# Patient Record
Sex: Male | Born: 1940
Health system: Southern US, Community
[De-identification: ages and names within clinical notes are randomized; demographics above are authoritative.]

## PROBLEM LIST (undated history)

## (undated) DIAGNOSIS — J449 Chronic obstructive pulmonary disease, unspecified: Secondary | ICD-10-CM

## (undated) DIAGNOSIS — Z8679 Personal history of other diseases of the circulatory system: Secondary | ICD-10-CM

## (undated) DIAGNOSIS — F32A Depression, unspecified: Secondary | ICD-10-CM

## (undated) DIAGNOSIS — I639 Cerebral infarction, unspecified: Secondary | ICD-10-CM

## (undated) DIAGNOSIS — G4730 Sleep apnea, unspecified: Secondary | ICD-10-CM

## (undated) DIAGNOSIS — Z89612 Acquired absence of left leg above knee: Secondary | ICD-10-CM

## (undated) DIAGNOSIS — Z5189 Encounter for other specified aftercare: Secondary | ICD-10-CM

## (undated) DIAGNOSIS — I739 Peripheral vascular disease, unspecified: Secondary | ICD-10-CM

## (undated) DIAGNOSIS — G510 Bell's palsy: Secondary | ICD-10-CM

## (undated) DIAGNOSIS — D7582 Heparin induced thrombocytopenia (HIT): Secondary | ICD-10-CM

## (undated) DIAGNOSIS — I4891 Unspecified atrial fibrillation: Secondary | ICD-10-CM

## (undated) DIAGNOSIS — Z01810 Encounter for preprocedural cardiovascular examination: Secondary | ICD-10-CM

## (undated) DIAGNOSIS — Z7409 Other reduced mobility: Secondary | ICD-10-CM

## (undated) DIAGNOSIS — E785 Hyperlipidemia, unspecified: Secondary | ICD-10-CM

## (undated) DIAGNOSIS — M1990 Unspecified osteoarthritis, unspecified site: Secondary | ICD-10-CM

## (undated) DIAGNOSIS — I5021 Acute systolic (congestive) heart failure: Secondary | ICD-10-CM

## (undated) DIAGNOSIS — I509 Heart failure, unspecified: Secondary | ICD-10-CM

## (undated) DIAGNOSIS — E7800 Pure hypercholesterolemia, unspecified: Secondary | ICD-10-CM

## (undated) DIAGNOSIS — I2510 Atherosclerotic heart disease of native coronary artery without angina pectoris: Secondary | ICD-10-CM

## (undated) DIAGNOSIS — I10 Essential (primary) hypertension: Secondary | ICD-10-CM

## (undated) DIAGNOSIS — I429 Cardiomyopathy, unspecified: Secondary | ICD-10-CM

## (undated) DIAGNOSIS — I248 Other forms of acute ischemic heart disease: Secondary | ICD-10-CM

## (undated) DIAGNOSIS — F419 Anxiety disorder, unspecified: Secondary | ICD-10-CM

## (undated) DIAGNOSIS — F329 Major depressive disorder, single episode, unspecified: Secondary | ICD-10-CM

## (undated) DIAGNOSIS — M47816 Spondylosis without myelopathy or radiculopathy, lumbar region: Secondary | ICD-10-CM

## (undated) DIAGNOSIS — R569 Unspecified convulsions: Secondary | ICD-10-CM

## (undated) DIAGNOSIS — I255 Ischemic cardiomyopathy: Secondary | ICD-10-CM

## (undated) DIAGNOSIS — G629 Polyneuropathy, unspecified: Secondary | ICD-10-CM

## (undated) DIAGNOSIS — M79609 Pain in unspecified limb: Secondary | ICD-10-CM

## (undated) DIAGNOSIS — I6529 Occlusion and stenosis of unspecified carotid artery: Secondary | ICD-10-CM

## (undated) DIAGNOSIS — D649 Anemia, unspecified: Secondary | ICD-10-CM

## (undated) DIAGNOSIS — I219 Acute myocardial infarction, unspecified: Secondary | ICD-10-CM

## (undated) DIAGNOSIS — I779 Disorder of arteries and arterioles, unspecified: Secondary | ICD-10-CM

## (undated) HISTORY — DX: Atherosclerotic heart disease of native coronary artery without angina pectoris: I25.10

## (undated) HISTORY — DX: Occlusion and stenosis of unspecified carotid artery: I65.29

## (undated) HISTORY — DX: Peripheral vascular disease, unspecified: I73.9

## (undated) HISTORY — DX: Anemia, unspecified: D64.9

## (undated) HISTORY — DX: Unspecified atrial fibrillation: I48.91

## (undated) HISTORY — DX: Other forms of acute ischemic heart disease: I24.8

## (undated) HISTORY — DX: Personal history of other diseases of the circulatory system: Z86.79

## (undated) HISTORY — DX: Essential (primary) hypertension: I10

## (undated) HISTORY — DX: Encounter for preprocedural cardiovascular examination: Z01.810

## (undated) HISTORY — DX: Hyperlipidemia, unspecified: E78.5

## (undated) HISTORY — DX: Heart failure, unspecified: I50.9

## (undated) HISTORY — DX: Acquired absence of left leg above knee: Z89.612

## (undated) HISTORY — PX: LEG AMPUTATION ABOVE KNEE: SHX117

## (undated) HISTORY — PX: FEMORAL BYPASS: SHX50

## (undated) HISTORY — DX: Sleep apnea, unspecified: G47.30

## (undated) HISTORY — DX: Acute systolic (congestive) heart failure: I50.21

## (undated) HISTORY — PX: THROMBOENDARTERECTOMY: SHX46

## (undated) HISTORY — DX: Acute myocardial infarction, unspecified: I21.9

## (undated) HISTORY — PX: CHOLECYSTECTOMY: SHX55

## (undated) HISTORY — DX: Encounter for other specified aftercare: Z51.89

## (undated) HISTORY — DX: Cerebral infarction, unspecified: I63.9

## (undated) HISTORY — DX: Anxiety disorder, unspecified: F41.9

## (undated) HISTORY — DX: Major depressive disorder, single episode, unspecified: F32.9

## (undated) HISTORY — DX: Heparin induced thrombocytopenia (HIT): D75.82

## (undated) HISTORY — DX: Depression, unspecified: F32.A

## (undated) HISTORY — PX: APPENDECTOMY: SHX54

## (undated) HISTORY — DX: Cardiomyopathy, unspecified: I42.9

## (undated) HISTORY — DX: Other reduced mobility: Z74.09

## (undated) HISTORY — DX: Spondylosis without myelopathy or radiculopathy, lumbar region: M47.816

## (undated) HISTORY — DX: Disorder of arteries and arterioles, unspecified: I77.9

## (undated) HISTORY — DX: Chronic obstructive pulmonary disease, unspecified: J44.9

## (undated) HISTORY — PX: FRACTURE SURGERY: SHX138

## (undated) HISTORY — DX: Unspecified osteoarthritis, unspecified site: M19.90

## (undated) HISTORY — DX: Polyneuropathy, unspecified: G62.9

## (undated) HISTORY — DX: Unspecified convulsions: R56.9

## (undated) HISTORY — DX: Pain in unspecified limb: M79.609

## (undated) HISTORY — DX: Ischemic cardiomyopathy: I25.5

## (undated) HISTORY — DX: Bell's palsy: G51.0

## (undated) HISTORY — DX: Pure hypercholesterolemia, unspecified: E78.00

---
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
## 1999-03-28 HISTORY — PX: SPINE SURGERY: SHX786

## 2000-01-25 HISTORY — PX: ANGIOPLASTY: SHX39

## 2000-02-07 ENCOUNTER — Inpatient Hospital Stay (HOSPITAL_COMMUNITY): Admission: EM | Admit: 2000-02-07 | Discharge: 2000-02-09 | Payer: Self-pay | Admitting: Cardiology

## 2000-07-25 HISTORY — PX: OTHER SURGICAL HISTORY: SHX169

## 2000-08-17 ENCOUNTER — Inpatient Hospital Stay (HOSPITAL_COMMUNITY): Admission: AD | Admit: 2000-08-17 | Discharge: 2000-08-20 | Payer: Self-pay | Admitting: Internal Medicine

## 2000-08-17 ENCOUNTER — Encounter: Payer: Self-pay | Admitting: Internal Medicine

## 2000-09-24 HISTORY — PX: CAROTID ENDARTERECTOMY: SUR193

## 2000-10-02 ENCOUNTER — Encounter: Payer: Self-pay | Admitting: *Deleted

## 2000-10-06 ENCOUNTER — Encounter (INDEPENDENT_AMBULATORY_CARE_PROVIDER_SITE_OTHER): Payer: Self-pay | Admitting: *Deleted

## 2000-10-06 ENCOUNTER — Inpatient Hospital Stay (HOSPITAL_COMMUNITY): Admission: RE | Admit: 2000-10-06 | Discharge: 2000-10-08 | Payer: Self-pay | Admitting: *Deleted

## 2000-10-07 ENCOUNTER — Encounter: Payer: Self-pay | Admitting: Pediatrics

## 2005-02-24 HISTORY — PX: ROTATOR CUFF REPAIR: SHX139

## 2007-02-25 HISTORY — PX: COLON SURGERY: SHX602

## 2008-12-22 ENCOUNTER — Encounter: Admission: RE | Admit: 2008-12-22 | Discharge: 2008-12-22 | Payer: Self-pay | Admitting: Orthopedic Surgery

## 2009-07-10 ENCOUNTER — Inpatient Hospital Stay (HOSPITAL_COMMUNITY)
Admission: RE | Admit: 2009-07-10 | Discharge: 2009-07-12 | Payer: Self-pay | Source: Home / Self Care | Admitting: Neurosurgery

## 2009-10-31 ENCOUNTER — Encounter: Admission: RE | Admit: 2009-10-31 | Discharge: 2009-10-31 | Payer: Self-pay | Admitting: Neurosurgery

## 2009-11-23 ENCOUNTER — Ambulatory Visit: Payer: Self-pay | Admitting: Critical Care Medicine

## 2009-11-23 ENCOUNTER — Inpatient Hospital Stay (HOSPITAL_COMMUNITY)
Admission: RE | Admit: 2009-11-23 | Discharge: 2009-11-28 | Payer: Self-pay | Source: Home / Self Care | Admitting: Neurosurgery

## 2010-05-07 ENCOUNTER — Other Ambulatory Visit: Payer: Self-pay | Admitting: Neurosurgery

## 2010-05-07 DIAGNOSIS — M47816 Spondylosis without myelopathy or radiculopathy, lumbar region: Secondary | ICD-10-CM

## 2010-05-08 LAB — BASIC METABOLIC PANEL
BUN: 14 mg/dL (ref 6–23)
CO2: 28 mEq/L (ref 19–32)
Calcium: 8.3 mg/dL — ABNORMAL LOW (ref 8.4–10.5)
Chloride: 103 mEq/L (ref 96–112)
Creatinine, Ser: 1.09 mg/dL (ref 0.4–1.5)
GFR calc Af Amer: >60 mL/min (ref >60)
Potassium: 4.6 mEq/L (ref 3.5–5.1)

## 2010-05-08 LAB — CBC
MCH: 31.8 pg (ref 26.0–34.0)
MCHC: 33.2 g/dL (ref 30.0–36.0)
MCV: 95.5 fL (ref 78.0–100.0)
Platelets: 106 10*3/uL — ABNORMAL LOW (ref 150–400)
RDW: 13.2 % (ref 11.5–15.5)
WBC: 9.5 10*3/uL (ref 4.0–10.5)

## 2010-05-08 LAB — BLOOD GAS, ARTERIAL
Bicarbonate: 22.3 mEq/L (ref 20.0–24.0)
O2 Saturation: 96.6 %
pH, Arterial: 7.498 — ABNORMAL HIGH (ref 7.350–7.450)

## 2010-05-09 ENCOUNTER — Ambulatory Visit
Admission: RE | Admit: 2010-05-09 | Discharge: 2010-05-09 | Disposition: A | Discharge status: Home / Self Care | Payer: Medicare Other | Source: Ambulatory Visit | Attending: Neurosurgery | Admitting: Neurosurgery

## 2010-05-09 DIAGNOSIS — M47816 Spondylosis without myelopathy or radiculopathy, lumbar region: Secondary | ICD-10-CM

## 2010-05-09 LAB — COMPREHENSIVE METABOLIC PANEL
AST: 36 U/L (ref 0–37)
Albumin: 4 g/dL (ref 3.5–5.2)
BUN: 14 mg/dL (ref 6–23)
CO2: 30 mEq/L (ref 19–32)
Calcium: 9.3 mg/dL (ref 8.4–10.5)

## 2010-05-09 LAB — POCT I-STAT 3, ART BLOOD GAS (G3+)
Acid-base deficit: 4 mmol/L — ABNORMAL HIGH (ref 0.0–2.0)
Patient temperature: 98.5
TCO2: 28 mmol/L (ref 0–100)
pCO2 arterial: 67.9 mmHg (ref 35.0–45.0)
pH, Arterial: 7.184 — CL (ref 7.350–7.450)
pO2, Arterial: 72 mmHg — ABNORMAL LOW (ref 80.0–100.0)

## 2010-05-09 LAB — CBC
Hemoglobin: 14 g/dL (ref 13.0–17.0)
MCV: 93.6 fL (ref 78.0–100.0)
WBC: 4.9 10*3/uL (ref 4.0–10.5)

## 2010-05-09 LAB — URINALYSIS, ROUTINE W REFLEX MICROSCOPIC
Glucose, UA: NEGATIVE mg/dL
Nitrite: NEGATIVE
Protein, ur: NEGATIVE mg/dL
Specific Gravity, Urine: 1.019 (ref 1.005–1.030)
Urobilinogen, UA: 0.2 mg/dL (ref 0.0–1.0)
pH: 6.5 (ref 5.0–8.0)

## 2010-05-09 LAB — DIFFERENTIAL
Eosinophils Absolute: 0.3 10*3/uL (ref 0.0–0.7)
Eosinophils Relative: 6 % — ABNORMAL HIGH (ref 0–5)
Monocytes Relative: 9 % (ref 3–12)

## 2010-05-09 LAB — PROTIME-INR: Prothrombin Time: 13.4 seconds (ref 11.6–15.2)

## 2010-05-09 LAB — SURGICAL PCR SCREEN: Staphylococcus aureus: NEGATIVE

## 2010-05-09 LAB — TYPE AND SCREEN

## 2010-05-13 LAB — MRSA PCR SCREENING: MRSA by PCR: NEGATIVE

## 2010-05-14 LAB — URINALYSIS, ROUTINE W REFLEX MICROSCOPIC
Bilirubin Urine: NEGATIVE
Glucose, UA: NEGATIVE mg/dL
Ketones, ur: NEGATIVE mg/dL
pH: 6 (ref 5.0–8.0)

## 2010-05-14 LAB — COMPREHENSIVE METABOLIC PANEL
ALT: 34 U/L (ref 0–53)
Albumin: 4 g/dL (ref 3.5–5.2)
CO2: 30 mEq/L (ref 19–32)
GFR calc Af Amer: >60 mL/min (ref >60)
Glucose, Bld: 70 mg/dL (ref 70–99)

## 2010-05-14 LAB — DIFFERENTIAL
Basophils Absolute: 0 10*3/uL (ref 0.0–0.1)
Basophils Relative: 0 % (ref 0–1)
Eosinophils Relative: 5 % (ref 0–5)
Monocytes Relative: 13 % — ABNORMAL HIGH (ref 3–12)

## 2010-05-14 LAB — BILIRUBIN, DIRECT: Bilirubin, Direct: 0.1 mg/dL (ref 0.0–0.3)

## 2010-05-14 LAB — CBC: RBC: 4.29 MIL/uL (ref 4.22–5.81)

## 2010-05-14 LAB — PROTIME-INR: Prothrombin Time: 12.6 seconds (ref 11.6–15.2)

## 2010-07-02 ENCOUNTER — Other Ambulatory Visit: Payer: Self-pay | Admitting: Neurosurgery

## 2010-07-02 DIAGNOSIS — M47816 Spondylosis without myelopathy or radiculopathy, lumbar region: Secondary | ICD-10-CM

## 2010-07-03 ENCOUNTER — Ambulatory Visit
Admission: RE | Admit: 2010-07-03 | Discharge: 2010-07-03 | Disposition: A | Discharge status: Home / Self Care | Payer: Medicare Other | Source: Ambulatory Visit | Attending: Neurosurgery | Admitting: Neurosurgery

## 2010-07-03 ENCOUNTER — Other Ambulatory Visit: Payer: Self-pay | Admitting: Neurosurgery

## 2010-07-03 DIAGNOSIS — M47816 Spondylosis without myelopathy or radiculopathy, lumbar region: Secondary | ICD-10-CM

## 2010-07-12 NOTE — Cardiovascular Report (Signed)
[place]. [facility]  Patient:    [name] [name], [name] [name] Visit Number: [id number] MRN: [id number]          Service Type: SUR Location: [id number] Attending Physician:  Melvenia Needles Proc. Date: 08/18/00 Admit Date:  10/06/2000 Discharge Date: 10/08/2000   CC:         Dr. Sherlyn Lick, Ruben Reason, M.D. [facility]   Cardiac Catheterization  DATE OF BIRTH:  Jan 29, 1941  PROCEDURE: 1. Left heart catheterization with selective coronary angiography. 2. Ventriculography.  DIAGNOSES: 1. Single vessel coronary artery disease with a 50 to 70% stenosis of the mid    left anterior descending. 2. Patent stent in the mid left anterior descending. 3. Elevated left ventricular end diastolic pressure. 4. Normal left ventricular systolic function.  LOCATION:  Right femoral artery.  COMPLICATIONS:  None.  DESCRIPTION OF PROCEDURE:  After informed consent was obtained, the patient was brought to the catheterization laboratory.  The right femoral artery was accessed using modified Seldinger technique.  Selective coronary angiography was performed with 6 [place] JL4 and JR4 catheters.  Selective coronary angiography was performed in two projections using manual injection of contrast. After selective coronary angiography, ventriculography was performed with an angled 6 French pigtail catheter.  Power injection of contrast was used in the single plane RAO projection.  No complications were noted after the procedure and the patient was brought back to the recovery room.  FINDINGS:  HEMODYNAMICS:  Aortic pressure 140/70, left ventricular pressure 140/18 mmHg.  SELECTIVE CORONARY ANGIOGRAPHY:  Left main coronary artery is normal.  Left anterior descending artery has a diffuse proximal 30% stenosis.  The stent in the mid LAD just beyond the septal perforator shows no evidence of in-stent restenosis and is widely patent.  The mid LAD just proximal to the  third diagonal branch has approximately 50 to 70% stenosis with somewhat of a hazy appearance.  Circumflex coronary artery gives rise to two large obtuse marginal branches with the first obtuse marginal branch having a 30% diffuse stenosis in the proximal segment.  Right coronary artery is a large caliber vessel with a 30% stenosis in the proximal vessel, 20 to 30% in the midvessel, and diffuse blocking in the distal part of the vessel.  Two posterolateral branches and the posterior descending artery terminate the right coronary artery and they are free of flow limiting disease.  Ventriculography performed in single plane RAO projection.  Ejection fraction is 62%.  There is no mitral regurgitation and no significant wall motion abnormalities.  RECOMMENDATIONS:  Angiographic images have been reviewed with Dr. Riley Kill. The patients symptoms of substernal chest pain and somewhat worsened appearance of this lesion in the mid-LAD, the decision was made to proceed with percutaneous coronary intervention to the mid-LAD.  Of note, the patient did have a stress test done last Friday in the office antidating his sudden onset of shortness of breath and substernal chest pain.  There was no evidence of ischemia on this study. Attending Physician:  Melvenia Needles DD:  08/18/00 TD:  08/18/00 Job: [id number]

## 2010-07-12 NOTE — Op Note (Signed)
[place]. [facility]  Patient:    [name] [name], [name] [name]                    MRN: [id number] Proc. Date: 10/06/00 Adm. Date:  [id number] Attending:  Melvenia Needles CC:         Arturo Morton. Riley Kill, M.D. [facility]   Operative Report  PREOPERATIVE DIAGNOSIS:  Severe right internal carotid artery stenosis.  POSTOPERATIVE DIAGNOSIS:  Severe right internal carotid artery stenosis.  PROCEDURE:  Right carotid endarterectomy, Dacron patch angioplasty.  SURGEON:  Denman George, M.D.  ASSISTANT:  Dominica Severin, P.A.  ANESTHESIA:  General endotracheal.  ANESTHESIOLOGIST:  Bedelia Person, M.D.  CLINICAL NOTE:  This is a 70 year old male with a history of advanced coronary artery disease.  He was recently found to have bilateral carotid bruits. Doppler evaluation revealed severe right internal carotid artery stenosis, moderate to severe left internal carotid artery stenosis.  He denied symptoms.  Patient seen and evaluated in the CVTS office.  Recommendation made for right carotid endarterectomy for reduction of stroke risk.  The patient consented to surgery.  Risks and benefits of the operative procedure explained to the patient in detail, including the potential risks of MI, CVA, and death, approximately 1-2%.  DESCRIPTION OF PROCEDURE:  Patient placed in the supine position.  General endotracheal anesthesia induced.  Foley catheter, arterial line in place. Right neck prepped and draped in a sterile fashion.  Curvilinear skin incision made along the anterior border of the right sternomastoid muscle.  Subcutaneous tissue and platysma divided with electrocautery.  Deep dissection carried down through the subcutaneous tissue to the sternomastoid muscle.  Sternomastoid muscle reflected posteriorly.  The facial vein ligated with 2-0 silk and divided.  The common carotid artery mobilized at the omohyoid and encircled with a vessel loop.  The carotid bifurcation exposed, the  origin of the superior thyroid and external carotid encircled with vessel loops.  The internal carotid artery followed distally up to the posterior belly of the digastric muscle.  The hypoglossal nerve and vagus nerve were both clearly identified and preserved.  The distal internal carotid artery encircled with a vessel loop.  The patient administered a total of 7000 units of heparin intravenously. Adequate circulation time permitted.  The carotid vessels controlled with clamps.  A longitudinal arteriotomy made in the distal common carotid artery. The arteriotomy extended across the carotid bulb and up into the internal carotid artery.  A fibrous plaque was present.  At the carotid bifurcation was approximately a 90% right internal carotid artery stenosis.  A shunt was inserted.  A endarterectomy elevator used to remove the plaque.  The plaque divided proximally in the common carotid artery with Potts scissors.  The external carotid and the superior thyroid endarterectomized using an eversion technique.  The plaque feathered well out of the internal carotid artery. Fragments of plaque removed with plaque forceps.  The site irrigated with heparin and saline solution.  A preclotted knitted Dacron patch was then placed over the endarterectomy site with running 6-0 Prolene suture.  At completion of this, the shunt was removed.  All vessels well flushed.  Clamps were removed, directing the initial antegrade flow up the external carotid artery.  Following this, the internal carotid was released.  Excellent pulse and Doppler signal in the distal internal carotid artery.  The patient administered 50 mg protamine intravenously.  Adequate hemostasis obtained, sponge and instrument counts were correct.  Sternomastoid fascia closed with running 2-0 Vicryl  suture.  Platysma closed with running 3-0 Vicryl suture.  Skin closed with 4-0 Monocryl.  Half-inch Steri-Strips applied.  The patient  transferred to the recovery room.  Neurologically intact. DD:  10/06/00 TD:  10/06/00 Job: [id number]

## 2010-07-12 NOTE — Cardiovascular Report (Signed)
[place]. [facility]  Patient:    [name] [name], [name] [name]                    MRN: [id number] Proc. Date: 02/07/00 Adm. Date:  [id number] Attending:  Nelta Numbers CC:         Dr. Lawana Pai  Cath Lab   Cardiac Catheterization  PROCEDURE: 1. Left heart catheterization with coronary angiography, left    ventriculography, and abdominal aortography. 2. PTCA with stent placement in the mid-LAD.  INDICATIONS:  Mr. Gail is a 70 year old male who was admitted with a non-Q wave myocardial infarction.  DESCRIPTION OF PROCEDURE:  A 6 French sheath was placed in the right femoral artery.  Standard Judkins 6 French catheters were utilized.  Contrast was Ominpaque.  There were no complications.  RESULTS:  HEMODYNAMICS:  Left ventricular pressure 122/15, aortic pressure 138/62. There was no aortic valve gradient.  LEFT VENTRICULOGRAM:  There is moderate hypokinesis of the anterior wall and mild akinesis of the apical wall.  Ejection fraction calculated at 55%. No mitral regurgitation.  ABDOMINAL AORTOGRAM:  Revealed patent right renal artery.  There are two left renal arteries, the most superior of which is patent, the inferior of which has a 70% stenosis.  There is mild diffuse atherosclerotic disease of the infrarenal abdominal aorta.  The left common iliac artery has a proximal 60% stenosis and the right iliac artery has a 30% stenosis.  CORONARY ARTERIOGRAPHY:  (Right dominant).  Left main is normal.  Left anterior descending artery has a tubular 30% stenosis in the proximal vessel followed by a 95% stenosis in the midvessel.  Further down in the midvessel is a 50% stenosis.  The distal LAD has a 20% stenosis.  The LAD gives rise to two small diagonal branches.  The left circumflex gives rise to a normal size first marginal, normal second marginal, and a small third marginal.  There is a 30% stenosis in the proximal portion of the first marginal  branch.  Right coronary artery is a dominant vessel.  It has a 30% stenosis proximally followed by 25% stenosis.  The midvessel has a 30% stenosis.  The distal right coronary artery gives rise to a normal size posterior descending artery and two normal size posterolateral branches.  IMPRESSION: 1. Mildly decreased left ventricular systolic function as described. 2. Moderate peripheral vascular disease. 3. One vessel coronary artery disease characterized by a critical stenosis    of the mid-LAD.  PLAN:  Percutaneous intervention of the LAD, see below.  PTCA PROCEDURE:  Following completion of diagnostic catheterization, we proceeded with coronary intervention.  A preexisting 6 French sheath in the right femoral artery exchanged over wire for 7 French sheath.  The patient was enrolled in the VICC study comparing different contrast agents.  He was maintained on Aggrastat which had been started on admission and heparin was administered to achieve an ACT of over 200 seconds.  We used a 7 [place] guiding catheter and a BMW wire.  The lesion was initially dilated with a 3.0 x 15 mm Quantum Ranger balloon inflated to 8 atmospheres.  We then deployed a 2.75 x 15 mm Penta stent at 12 atmospheres.  Final angiographic images revealed patency of the LAD with 0% residual stenosis and [place] 3 flow.  COMPLICATIONS:  None.  RESULTS:  Successful PTCA with stent placement in the mid-LAD reducing a 95% stenosis to 0% residual with [place] 3 flow.  PLAN:  Aggrastat will be  continued for an additional 18 hours.  Plavix will be administered for four weeks. DD:  02/07/00 TD:  02/08/00 Job: [id number]

## 2010-07-12 NOTE — Discharge Summary (Signed)
[place]. [facility]  Patient:    [name] [name], [name] [name] Visit Number: [id number] MRN: [id number]          Service Type: SUR Location: [id number] Attending Physician:  Melvenia Needles Dictated by:   Dominica Severin, P.A. Admit Date:  10/06/2000 Discharge Date: 10/08/2000   CC:         CVTS office  Dr. Nelia Shi, [place]  Arturo Morton. Riley Kill, M.D. [facility]  Deanna Artis. Sharene Skeans, M.D.   Discharge Summary  DATE OF BIRTH:  [birth year]  PRIMARY ADMISSION DIAGNOSIS:  Right internal left carotid artery stenosis.  SECONDARY DIAGNOSES/PAST MEDICAL HISTORY: 1. Carotid artery disease. 2. Hypertension. 3. Hyperlipidemia. 4. Hard of hearing. 5. Coronary artery disease. 6. Previous history of tobacco abuse. 7. Status post PTCA/stent of left anterior descending x 2 in December 2001    and January 2002, respectively. 8. 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  NEW DIAGNOSES/DISCHARGE DIAGNOSES: 1. Status post right carotid endarterectomy. 2. Status post right Bells palsy.  PROCEDURES: 1. Right carotid endarterectomy with Dacron patch angioplasty, done on    October 06, 2000. 2. Modified barium swallow study done on October 07, 2000.  HOSPITAL COURSE:  This patient was referred by Dr. Riley Kill for evaluation of carotid artery disease as part of his cardiovascular workup. Carotid bruits were heard prompting Doppler evaluation.  Dopplers on August 19, 2000, demonstrated severe right internal carotid artery stenosis greater than 80% and moderate disease in the left.  The patient was evaluated by Dr. Madilyn Fireman.  He recommended to proceed with the right carotid endarterectomy, which took place on October 06, 2000.  The patient tolerated the procedure well.  His postoperative course was notable for some right seventh nerve palsy, which was found later on August 13.  The patient was seen and evaluated by neurology and the patient was found to have a Bells palsy.   An MRI scan was to be done to evaluate the status of the right carotid endarterectomy.  The duplex of the right carotid was clear.  The patient on postoperative day #1 was complaining of some dysphagia.  Speech evaluation and modified barium swallow study was done and the patient tolerated a dysphagia 3/thin liquid diet.  His cardiac and respiratory status remained stable.  He was not able to tolerate MRI scan. He was not any worse and neurology had agreed to observe the patient for now and continued to monitor the patient for any neurological changes.  Otherwise the patient remained stable.  He was mobilized without difficulty.  He was able to tolerate his diet and he was discharged on October 08, 2000.  DISCHARGE CONDITION:  Stable.  DISCHARGE MEDICATIONS: 1. Coated aspirin daily. 2. Altace 5 mg one tablet twice a day. 3. Metoprolol 50 mg twice a day. 4. Zocor 20 mg at bedtime. 5. Folic acid daily. 6. Fish oil daily. 7. The patient was also sent home with Tylox one or two tablets every four    hours as needed for pain. 8. He was given a prescription for Artificial Tears to his right eye. 9. Ciprofloxacin eyedrops two drops to his right eye.  ACTIVITY:  The patient was instructed not to do any driving, to avoid heavy lifting or strenuous activity.  He is to continue his breathing exercises and is to walk daily.  DIET:  He is to follow a heart healthy low fat, low sodium diet.  WOUND CARE:  He was told he could shower.  He is to notify the office of any increased redness, swelling, or drainage from the incision or if he has any temperature greater than 101 degrees Fahrenheit.  FOLLOW-UP:  He is to see Dr. Madilyn Fireman on October 19, 2000 at 11:15 in the morning and he is to see Dr. Sharene Skeans for a follow-up appointment also.  He is also supposed to follow up with Dr. Riley Kill and Dr. Lawana Pai as directed. Dictated by:   Dominica Severin, P.A. Attending Physician:  Melvenia Needles DD:   11/16/00 TD:  11/16/00 Job: [id number]

## 2010-07-12 NOTE — Consult Note (Signed)
[place]. [facility]  Patient:    [name] [name], [name] [name]                    MRN: [id number] Proc. Date: 10/06/00 Adm. Date:  [id number] Attending:  Melvenia Needles CC:         Denman George, M.D.  Dr. Remus Blake, M.D., [place]   Consultation Report  DATE OF BIRTH:  09-07-1940  CHIEF COMPLAINT:  Right facial weakness.  HISTORY OF PRESENT CONDITION:  The patient is a 70 year old right-handed Caucasian married gentleman who was admitted for elective right carotid endarterectomy which was discovered on his last hospitalization when he was evaluated for coronary artery disease.  The patient had a stent placed June 24 for anginal pain and tolerated the procedure well.  His ejection fraction at that time was 62%.  He had moderate stenosis proximally and distally of no greater than 50% and for the most part 20 to 30% in other vessels.  The patient has had previous bouts of coronary artery disease and had a stent placed a number of years ago successfully.  He had a heart attack at that time.  During this hospitalization, the patient was noted to have severe calcific and noncalcific plaque at the bifurcation extending into the internal and external carotid artery.  It was approximately 80% on the right and moderate irregular plaque at the same location that was 60 to 80% on the left. Vertebral flow was antegrade.  He was seen by Dr. Liliane Bade who admitted him into the hospital and performed endarterectomy today.  In the aftermath, the patient was noted to have proptosis of the right eye, mouth pulling to the right side, sensory numbness that was incomplete on the right side of his face, dysarthria, disconjugate gaze which was supposedly not new by report, and no other focal features.  The patient was seen first by the physicians assistant and then by Dr. Madilyn Fireman.  I was asked to see the patient to evaluate him and confirm the findings  and recommend further workup.  In the interim since the consultation was made, the patient has had a limited study of his carotids, and the right side is widely patent with no residual plaque or internal flap seen.  The left has a 40 to 60% stenosis.  Vertebrals were not insinuated.  The patient was scheduled to have a CT scan of the brain to evaluate the situation.  He is being given aspirin in the postoperative period.  PAST MEDICAL HISTORY:  Remarkable for subendocardial myocardial infarction in December 2001, hypertension, dyslipidemia, renal artery atherosclerosis.  The patient had a stent placed February 07, 2000.  REVIEW OF SYSTEMS:  Negative except as noted above.  However, postoperatively, the patient complains of a severe headache involving the retroauricular, auricular, and temporal region on the right side.  This is a steady, very severe pain.  In addition, he has complained of facial weakness, drooling from his mouth, difficulty with speech, hoarse voice, difficulty swallowing, and "numbness" of his right face.  CURRENT MEDICATIONS: 1. Enteric-coated aspirin 325 mg per day. 2. Zocor 20 mg at bedtime. 3. Altace 5 mg twice a day. 4. Lopressor 25 mg twice a day. 5. Niaspan 500 mg per day. 6. Folic acid 1 mg per day. 7. Zofran 4 mg IV as needed for nausea.  In addition, he has pain medication which includes morphine, Percocet, and Tylenol for graded pain.  ALLERGIES TO MEDICINES:  None. Intolerances: PHENERGAN (confusion).  PAST SURGICAL HISTORY:  None.  FAMILY HISTORY:  Father died at age 74 of coronary artery disease.  Mother died of old age.  The patient has two living brothers, ages 40 and 38; two living sisters, ages 2 and 75 who are healthy.  The patient has two sons. The patient has a daughter with chronic pancreatitis and Graves disease. Three grandchildren.  SOCIAL HISTORY:  The patient has been married for 39 years.  He is a Haematologist.   He stopped using tobacco in February 2002, previous one pack per day smoker for 45 years.  He does not engage in exercise or a specific diet.  He drinks two to three beers per day.  He does not use recreational drugs.  PHYSICAL EXAMINATION:  VITAL SIGNS:  Temperature 96.5, blood pressure 115/46, resting pulse 46, respirations 14, pulse oximetry 99%.  HEENT:  No signs of infection.  NECK:  Right carotid endarterectomy site is slightly swollen, dry, slightly tender.  He has a supple neck with full range of motion.  No bruits were auscultated.  LUNGS:  Clear.  HEART:  No murmurs.  Pulses normal.  ABDOMEN:  Soft.  Bowel sounds normal.  No hepatosplenomegaly.  EXTREMITIES:  Normal.  NEUROLOGIC:  Mental Status: Awake, alert.  No dysphasia or dyspraxia.  He has dysarthria and dysphagia.  (He coughs when he drinks liquids.)  Cranial Nerves: Round, reactive pupils.  Fundi show sharp disk margins.  No afferent pupillary defect.  Things seem sharper to him in the right eye than the left. This is the eye where he had surgery as a child.  He appears to have an alternating exotropia.  Each eye fixes and follows well with lateral and vertical excursions, but they do not work well together.  To me, he does not appear to have an internuclear ophthalmoplegia.  He also does not have a sixth nerve paresis on the right side.   There is a right peripheral seventh.  The patient cannot elevate his eyebrow.  He has intermittent only partial closure of his right eyelid, decreased right nasolabial fold, decreased right corner of his mouth.  No difference in taste to sugar or salt.  Sensation was intact to cold, pinprick, light touch.  His tongue and uvula are midline.  He has a hoarse voice.  He coughs when he drinks liquids.  Air conduction greater than bone conduction bilaterally.   Motor examination: Normal strength, tone, and mass.  Good fine motor movements.  No pronator drift.  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  Normal vibration and proprioception and stereoagnosis. Cerebellar examination: Finger-to-nose and rapid repetitive movements were okay.  Gait was not tested.  Deep tendon reflexes were normal except absent at the ankles.  Toes were bilaterally flexor.  IMPRESSION: 1. Right Bells palsy.  This seems to affect only the general somatic    afference (motor).  Taste is preserved.  Upper face seems a bit more    involved than the lower. 2. Disconjugate eye movements, alternating exotropia.  To a certain extent,    this is congenital, but the family believes it is worse at this time.    I cannot find specific extraocular movements weaknesses. 3. Hoarse voice with some dysphagia.  This may be a postoperative complication    that is stretching the vagus nerve. 4. Headache, right retroauricular, auricular, and temporal, which may also    be a finding postoperatively from endarterectomy, but the auricular  component has me concerned, particularly with the Bells palsy. 5. Status post right carotid endarterectomy with patent right carotid    vessel.  COMMENT:  We need to rule out a brainstem cerebrovascular accident coincident with surgery.  This seems unlikely to me.  A number of coincidences, however, have come together including an unexpected right Bells palsy, disconjugate eye movements that are worse, and hoarseness as well as headache.  The patient states he is not numb despite the fact that he uses that word. He means that it is "not working right."  He is intact to cold, pinprick, and light touch.  PLAN:  The patient will have an MRI of the brain with and without contrast, MRA intracranial.  Will rule out a brainstem stroke and also look to be certain that he does not have a dissection which would be unexpected but possible.  He will have a modified barium swallow to look at his swallowing. We will protect his eye with liquid tears during the day  and patching and Lacri-Lube at nighttime.  He will receive pain medication for his condition. If we are solely dealing with a Bells palsy, I would consider use of prednisone in the postoperative period but no Zovirax because it may very well be that there is somehow some traction or pressure on the seventh nerve (though I do not see how that could happen).  It is unlikely that this is caused by an infectious process.  I appreciate the opportunity to see him.  I will see him in followup and will interpret the MRI scan when it is available.DD:  10/06/00 TD:  10/07/00 Job: [id number]

## 2010-07-12 NOTE — Op Note (Signed)
[place]. [facility]  Patient:    [name] [name], [name] [name]                    MRN: [id number] Proc. Date: 10/06/00 Adm. Date:  [id number] Attending:  Melvenia Needles CC:         Arturo Morton. Riley Kill, M.D. [facility]   Operative Report  PREOPERATIVE DIAGNOSIS:  Severe right internal carotid artery stenosis.  POSTOPERATIVE DIAGNOSIS:  Severe right internal carotid artery stenosis.  PROCEDURE:  Right carotid endarterectomy, Dacron patch angioplasty.  SURGEON:  John C. Madilyn Fireman, M.D.  ASSISTANT:  Dominica Severin, P.A.  ANESTHETIC:  General endotracheal.  ANESTHESIOLOGIST:  Bedelia Person, M.D.  INDICATIONS:  This is a 70 year old male with a history of advanced coronary artery disease.  He was recently found to have bilateral carotid bruits. Doppler evaluation revealed severe right internal carotid artery stenosis, moderate to severe left internal carotid artery stenosis.  He denied symptoms.  The patient was seen and evaluated in the CVTS office.  Recommendation made for right carotid endarterectomy for reduction of stroke risk.  The patient consented to surgery.  Risks and benefits of the operative procedure were explained to the in detail, including the potential risk of MI, CVA, and death in approximately 1-2%.  DESCRIPTION OF PROCEDURE:  The patient was placed in the supine.  General endotracheal anesthesia induced.  Foley catheter and arterial line in place. Right neck prepped and draped in a sterile fashion.  A curvilinear skin incision made along the anterior part of the right sternomastoid muscle.  Subcutaneous tissue and platysma divided with electrocautery.  Deep dissection carried down through the subcutaneous tissue to the sternomastoid muscle.  Sternomastoid muscle reflected posteriorly.  The facial vein ligated with 2-0 silk and divided.  The common carotid artery mobilized at the omohyoid and encircled with a vessel loop.  The carotid bifurcation exposed the  origin of the superior thyroid and external carotid encircled with vessel loops.  The internal carotid artery followed distally up to the posterior belly of the digastric muscle.  The hypoglossal nerve and vagus nerve were both clearly identified and preserved.  The distal internal carotid artery encircled with a vessel loop.  The patient was administered a total of 7000 units of heparin intravenously. Adequate circulation time permitted.  The carotid vessel was controlled with clamps.  A longitudinal arteriotomy made in the distal common carotid artery. The arteriotomy extended across the carotid bulb and up into the internal carotid artery.  Fibrous plaque was present.  Carotid bifurcation was approximately 90% right internal carotid artery stenosis.  A shunt was inserted.  An endarterectomy elevator used to remove the plaque.  Plaque divided proximally in the common carotid artery with Potts scissors.  The external carotid and superior thyroid endarterectomized using an eversion technique. The plaque _____ well out of the internal carotid artery.  Fragments of plaque removed with plaque forceps.  Site irrigated with heparin and saline solution.  A preclotted knitted Dacron patch was then placed over the endarterectomy site with running 6-0 Prolene suture.  At completion of this, shunt was removed, all vessels well flushed.  Clamps removed directing the initial antegrade flow of the external carotid artery, following this the internal carotid bruit was released.  Excellent pulse and Doppler signal in the distal internal carotid artery.  The patient administered 50 mg of Protamine intravenously.  Adequate hemostasis obtained.  Sponge and instrument counts correct.  Sternomastoid fascia closed with running 2-0 Vicryl suture, platysma  with running 3-0 Vicryl suture, skin closed with 4-0 Monocryl, and 1/2 inch Steri-Strips applied.  The patient transferred to the recovery room,  neurologically intact. DD:  10/06/00 TD:  10/06/00 Job: [id number]

## 2010-07-12 NOTE — H&P (Signed)
[place]. [facility]  Patient:    [name] [name], [name] [name]                      MRN: [id number] Adm. Date:  09/26/00 Attending:  Denman George, M.D. Dictator:   Marlowe Kays, P.A. CC:         Dr. Lubertha Sayres D. Riley Kill, M.D. [facility]   History and Physical  DATE OF BIRTH:  1941-02-06  CHIEF COMPLAINT:  Carotid artery disease.  HISTORY OF PRESENT ILLNESS:  [name] is a pleasant 70 year old white male referred by Dr. Riley Kill for evaluation of carotid artery disease.  As part of his cardiovascular workup, carotid bruits were heard prompting Doppler evaluation.  These Dopplers on August 19, 2000 demonstrated severe right ICA stenosis of greater than 80% with moderate disease on the left.  The patient then was evaluated by Dr. Madilyn Fireman who recommended to proceed with right CEA scheduled on October 06, 2000.  Other than occasional dizziness and voice hoarseness, the patient denies any headache, nausea, vomiting, vertigo, falls, seizures, numbness, tingling, muscle weakness, speech impairment, dysphagia, vision changes, syncope, presyncope, memory loss.  No confusion.  PAST MEDICAL HISTORY:  Carotid artery disease, hypertension, hypercholesterolemia, decreased hearing, CAD, previous history of tobacco abuse.  PAST SURGICAL HISTORY:  Status post PTCA and stent of the LAD x 2 in December 2001 and in June 2002, status post right stump traumatic amputation in 1960.  MEDICATIONS: 1. Zocor 20 mg q.h.s. 2. Altace 5 mg b.i.d. 3. Aspirin 325 mg p.o. q.d. 4. Plavix discontinued on September 24, 2000. 5. Metoprolol 50 mg half p.o. b.i.d. 6. Niaspan 500 mg q.d. 7. Folic acid (foltx) q.d. 8. Fish oil 1000 mg q.d.  ALLERGIES:  NKDA.  REVIEW OF SYSTEMS:  See HPI and past medical history for significant positives.  No diabetes, kidney disease, asthma, or other musculoskeletal disorders.  Mild constipation.  FAMILY HISTORY:  Mother died at 71 of heart disease.  Father  died at 57 of CVA.  SOCIAL HISTORY:  Married.  Three children.  He is retired.  He quit smoking on March 27, 2000 one pack a day and up to three packs a day for about 20 years.  He drinks at 12 pack a week.  PHYSICAL EXAMINATION  GENERAL:  Well-developed, well-nourished 70 year old white male in no acute distress.  Alert and oriented x 3.  VITAL SIGNS:  Blood pressure 120/58, pulse 58, respirations 16.  HEENT:  Normocephalic, atraumatic.  PERRLA.  EOMI.  The patient has small pupils for which fundus was not visualized properly.  NECK:  Supple.  No JVD.  Bilateral very soft bruit more pronounced on the left than on the right.  No lymphadenopathy.  CHEST:  Symmetrical on inspirations.  LUNGS:  Clear to auscultation bilaterally.  Slightly increased AP diameter.  CARDIOVASCULAR:  Regular rate and rhythm.  No murmurs, rubs, or gallops.  ABDOMEN:  Soft, protuberant, nontender.  Bowel sounds x 4.  No masses or bruits.  In the right lower quadrant there is a presence of a hemangioma.  GENITOURINARY:  Deferred.  RECTAL:  Deferred.  EXTREMITIES:  No cyanosis, clubbing, or edema.  No ulcerations.  Temperature warm.  PERIPHERAL PULSES:  Carotid, femoral, popliteal, dorsalis pedis, posterior tibialis 2+ bilaterally.  NEUROLOGIC:  Nonfocal.  Gait steady.  DTRs 2+.  Muscle strength 5/5.  ASSESSMENT AND PLAN:  Right internal carotid artery stenosis for right carotid endarterectomy on ______ by Dr. Madilyn Fireman.  Dr.  Madilyn Fireman has seen and evaluated this patient prior to the admission and has explained the risks and benefits involving the procedure and the patient has agreed to continue. DD:  10/02/00 TD:  10/02/00 Job: [id number]

## 2010-07-12 NOTE — Discharge Summary (Signed)
[place]. [facility]  Patient:    JOANGEL, VANOSDOL Visit Number: [id number] MRN: [id number]          Service Type: MED Location: [id number] Attending Physician:  Nathen May Dictated by:   Lavella Hammock, P.A. Admit Date:  08/17/2000 Disc. Date: 08/20/00   CC:         Harl Bowie, M.D.             Arturo Morton. Riley Kill, M.D. [facility]             P. Bud Face, M.D.                  Referring Physician Discharge [place]  DATE OF BIRTH:  [birth year]  PROCEDURE: 1. Cardiac catheterization. 2. Coronary arteriogram. 3. Left ventriculogram. 4. Carotid Dopplers. 5. Percutaneous transluminal coronary angioplasty and stent to the left    anterior descending.  HOSPITAL COURSE:  Mr. Donny Pique is a 70 year old male who was transferred from [facility] for a cardiac catheterization.  He was admitted to [facility] for frequent use of nitroglycerin and frequent episodes of chest pain since December.  It was felt that further evaluation and cardiac catheterization was needed and he was transferred to [facility].  He had a cardiac catheterization on August 18, 2000 which showed 30% proximal LAD, patent stent to the mid LAD, and 50-70% mid distal LAD.  There was minimal disease in the ramus intermedius and in the RCA at 20-30%, but no other significant coronary artery disease and his EF was 62%.  He was evaluated by Dr. Riley Kill and it was felt that the LAD was a significant problem.  He had PTCA and stent to the mid LAD with good result in less than 30% residual and there was an 80% distal lesion that was treated as well and that stenosis was reduced to 0.  He had a 2-D echocardiogram done for further evaluation and it showed an EF of 55-65% with a right ventricular size at the upper limits of normal and a mildly dilated left atrium, but otherwise no abnormalities.  He also had carotid Dopplers done and they showed ICA stenosis greater  than 80% on the right and 60-80% on the left.  The patient was offered a CVTS consult, but stated he would prefer to have that as an outpatient.  The patient was ambulatory without chest pain and doing well on August 20, 2000 and considered stable for discharge at that time.  LABORATORIES:  Chest x-ray:  No central pulmonary embolus, right greater than left densities in favored atelectasis over pneumonia.  Hemoglobin 11.3, hematocrit 32.6, WBC 6.9, platelets 208,000.  Sodium 140, potassium 3.7, chloride 107, CO2 26, BUN 8, creatinine 0.9, glucose 102.  CONDITION ON DISCHARGE:  Improved.  CONSULTS:  None.  COMPLICATIONS:  None.  DISCHARGE DIAGNOSES: 1. Coronary artery disease status post percutaneous transluminal coronary    angioplasty and stent to the left anterior descending with percutaneous    transluminal coronary angioplasty to the distal left anterior descending    this admission. 2. History of percutaneous transluminal coronary angioplasty and stent to the    left anterior descending in December 2001 associated with myocardial    infarction. 3. Hyperlipidemia. 4. History of back surgery x 1. 5. 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 6. Hypertension. 7. Remote history of tobacco use, none since February 2002.  DISCHARGE INSTRUCTIONS:  His activity level is to include no driving,  sexual or strenuous activity for two days.  He is to call the office for bleeding, swelling, or drainage at the catheterization site.  He is to stick to a low fat diet.  He is to follow-up with Dr. Madilyn Fireman and the CVTS will call.  He is to see Dr. Sherlyn Lick in two weeks for a groin check.  DISCHARGE MEDICATIONS: 1. Plavix 75 mg q.d. 2. Lopressor 50 mg one-half tablet b.i.d. 3. Altace 5 mg b.i.d. 4. Zocor 20 mg q.d. 5. Aspirin 325 mg q.d. 6. Nitroglycerin 0.4 mg sublingual p.r.n. 7. Vitamin E 400 I.U. q.d. Dictated by:   Lavella Hammock, P.A. Attending Physician:  Nathen May DD:  08/20/00 TD:  08/20/00 Job: [id number]

## 2010-07-12 NOTE — Cardiovascular Report (Signed)
[place]. [facility]  Patient:    [name] [name], [name] [name]                    MRN: [id number] Proc. Date: 08/19/00 Adm. Date:  [id number] Attending:  Nathen May CC:         Dr. Precious Gilding, M.D. [facility]  Cath Lab   Cardiac Catheterization  INDICATIONS:  [name] [name].  He recently presented with prolonged chest pain.  He had had a previous stent of the left anterior descending artery.  On repeat catheterization he had what appeared to be about a 70 to 75% stenosis distal to the stent site with a mean lumen diameter in the range of about 1 mm.  In addition, there was some partial restenosis in the stent, although, this did not appear to be flow limiting or high grade.  As a result of this, the decision was made to recommend percutaneous stenting of the lesion distal to the stent site.  Risks, benefits, and alternatives were discussed with the patient last evening.  He was brought to the catheterization laboratory for further evaluation.  PROCEDURE:  Percutaneous stenting of the mid left anterior descending artery. Percutaneous angioplasty of the stent site.   DESCRIPTION OF PROCEDURE:  The patient was brought to the catheterization lab and prepped and draped in the usual fashion.  Through an anterior puncture, the left femoral artery was easily entered.  A 7 French sheath was place. Heparin and Integrilin were given according to protocol and ACT rose to slightly over 300 seconds.  A JL4 guiding catheter was used to intubate the left main.  We had to use a traverse wire in order to cross the lesion.  We primarily stented the lesion just distal to the previously placed stent, and proximal to a diagonal branch.  Then, the balloon was pulled back and dilatations were done in the previously stented area.  After reanalysis of the segment, there was still a small area just proximal to the  previously placed stent and this was gently dilated up to about 7 to 8 atmospheres with marked improvement in the appearance of the artery.  There was excellent Timi 3 flow and a good appearing artery at the completion of the procedure.  All catheters were then subsequently removed and the femoral sheath sewn into place.  The patient was taken to the holding area in satisfactory condition.  Because of hypertension, two doses of labetolol 20 mg were given intravenously.  In addition multiple doses of intracoronary nitroglycerin were administered during the course of the procedure and an intravenous nitroglycerin drip started.  Heparin was discontinued at the completion of the procedure and 2B3A inhibition was continued.  HEMODYNAMIC DATA:  The central aortic pressure was 192/77 at the beginning of the procedure.  ANGIOGRAPHIC DATA:  The left anterior descending artery demonstrates a previously placed stent just beyond the small diagonal branch and the septal perforator.  As this courses distally leading into a second diagonal, there is a focal area with a mean lumen diameter of about 1 mm and slight haziness. This is slightly improved from the study yesterday.  The distal LAD is a fairly large caliber vessel being about 2.7 to 3 mm.  Following stenting, the area of high grade stenosis just prior to the second diagonal was reduced from about 75 to 80% to 0%, a more proximal area of about 60% narrowing throughout  the stented area was reduced to about 30%.  The entire vessel looked more widely patent.  Timi 3 flow was present at the beginning and at the end of the procedure.  CONCLUSION: 1. Successful percutaneous stenting of the left anterior descending artery    distal to the previously placed stent at a site of progressive disease. 2. Successful repeat balloon dilatation in a previously placed stent.  DISPOSITION:  The patient will be treated medically.  He may ultimately require  revascularization surgery, but at the present time, continued medical therapy is warranted.  ADDENDUM:  The second diagonal had about 30 to 50% narrowing after the stent placement, likely related to spasm. DD:  08/19/00 TD:  08/19/00 Job: [id number]

## 2010-07-12 NOTE — Discharge Summary (Signed)
[place]. [facility]  Patient:    [name] [name]                    MRN: [id number] Adm. Date:  [id number] Disc. Date: [id number] Attending:  Nelta Numbers Dictator:   Tereso Newcomer, P.A. CC:         [facility]  Dr. Lawana Pai   Discharge Summary  DATE OF BIRTH:  [birth year]  DISCHARGE DIAGNOSES: 1. Coronary artery disease. 2. Status post non-Q wave myocardial infarction. 3. Hypertriglyceridemia. 4. Hypertension. 5. Status post back surgery in [facility].  PROCEDURE:  Cardiac catheterization performed by Daisey Must, M.D. on February 07, 2000, revealing left ventriculogram; moderate anterior hypokinesis, mild apical akinesis, EF calculated at 55%, no MR.  Abdominal aortogram; left renal artery was a dual artery, superior was okay, inferior 70%.  Right renal artery okay, mild plaque.  Infrarenal left CIA, 60% right CIA, 30% coronaries.  Left main normal, LAD proximal 30%, mid 95/50%, distal 20%.  Left circumflex; OM1 30%, RCA proximal 30/25%, mid 30%.  PCI, status post stent to mid LAD with reduction of stenosis from 95 to 0%.  HOSPITAL COURSE:  The patient was transferred form [facility] with acute coronary syndrome.  He had had recent exertional angina developing into rest pain on the night of admission.  The pain radiated down both arms.  It improved with initial therapy in the emergency room.  Upon initial evaluation at [facility], he was symptom-free.  His examination revealed a blood pressure at 120/60, neck without JVD or bruits.  His lungs had decreased breath sounds and scattered rales.  Cardiac had a split S1.  His abdomen was benign.  His extremities were without edema.  EKG revealed T wave inversions in V3 through V5.  Initial troponin was 8.  Total CK 121.  He was accepted in transfer.  His troponins slowly trended downward after admission to [facility].  Initial troponin  was 1.70, and last recorded troponin was 1.56.  He went for a cardiac catheterization later on February 07, 2000, with Daisey Must, M.D.  The results are noted above.  His lipid panel returned revealing an elevated triglyceride level of 556, total cholesterol was 204, HDL 27, LDL not calculated.  He was started on Lopid for this.  He was seen by cardiac rehabilitation.  He ambulated without chest pain.  On the morning of February 09, 2000, he was found to be in stable enough condition for discharge home.  ACE inhibitors added prior to discharge.  Labs prior to discharge; white blood cell count 8400, hemoglobin 13.9, hematocrit 37.8, MCV 87.8, RDW 13.9, platelet count 213,000.  INR 0.9, sodium 139, potassium 4.0, chloride 103, CO2 26, BUN 12, creatinine 1, glucose 90, calcium 9.0.  DISCHARGE MEDICATIONS: 1. Plavix 75 mg q.d. x 1 month. 2. Coated aspirin 325 mg q.d. 3. Lopressor 25 mg b.i.d. 4. Lopid 600 mg b.i.d. 5. Nitroglycerin 0.4 mg sublingual p.r.n. chest pain. 6. Altace 2.5 mg q.d.  ACTIVITY:  No driving or heavy lifting for one week.  No work for two weeks.  DIET:  Low fat, low salt diet.  WOUND CARE:  The patient should watch his groin for any increased swelling, bleeding, or bruising.  Call our office with concerns.  FOLLOW-UP:  He is to see Daisey Must, M.D. in two weeks.  He is to call the office for an appointment.  He is to have a  BMP checked at his follow-up appointment. DD:  04/06/00 TD:  04/06/00 Job: [id number]

## 2010-09-30 HISTORY — PX: OTHER SURGICAL HISTORY: SHX169

## 2010-11-24 HISTORY — PX: SPINAL FUSION: SHX223

## 2011-04-17 IMAGING — CR DG CHEST 1V PORT
1 series · 1 of 1 positions shown · non-contrast
Comparison: 07/03/2009.

CLINICAL DATA: Hypoxemia.  Hypotension.  L5-S1 fusion performed
earlier today.

PORTABLE CHEST - 1 VIEW

[AP]
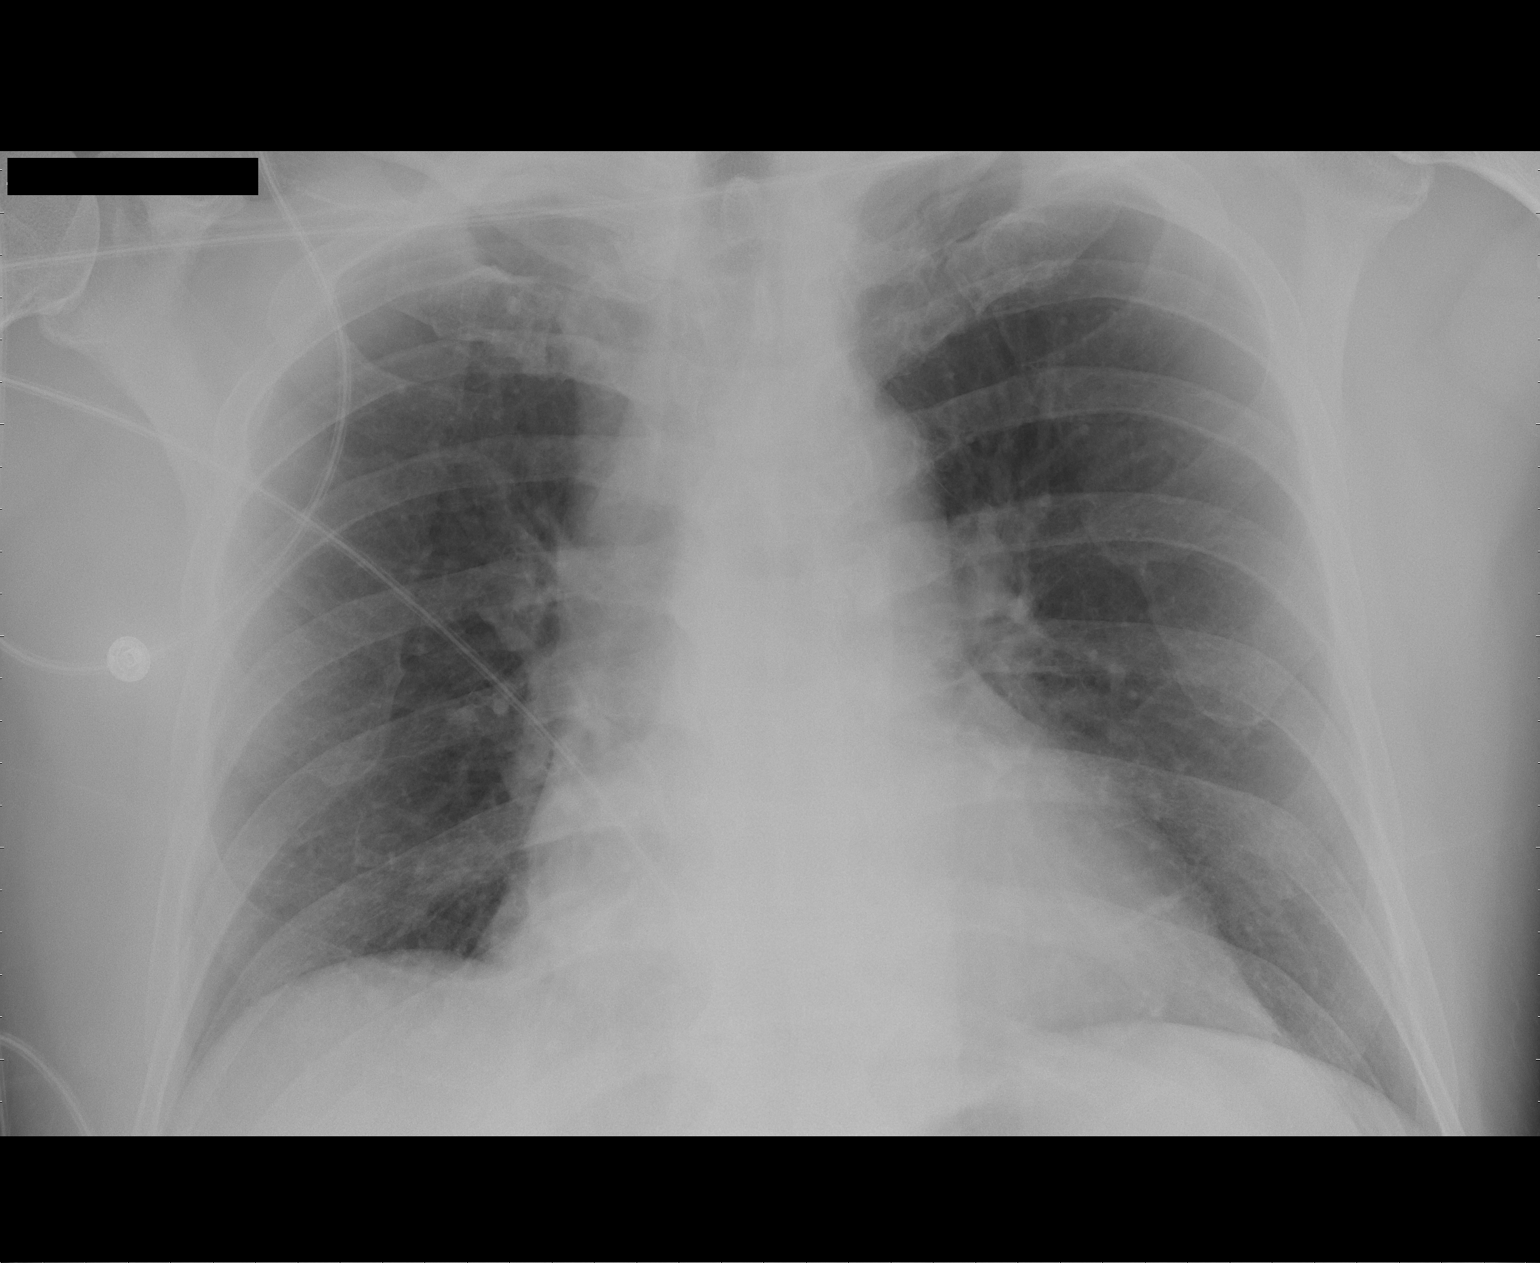

[1 of 1 positions shown; findings below may reference images not displayed]

FINDINGS: Interval enlargement of the cardiac silhouette.  Clear
lungs with a normal vascularity.  Thoracic spine degenerative
changes.
IMPRESSION: Interval cardiomegaly.

## 2011-04-17 IMAGING — RF DG LUMBAR SPINE 2-3V
1 series · 2 of 2 positions shown · non-contrast
Comparison: [DATE]

CLINICAL DATA: L5-S1 fusion

LUMBAR SPINE - 2-3 VIEW

[Series 1: run · 2 of 2 slices shown]
[im 1/2]
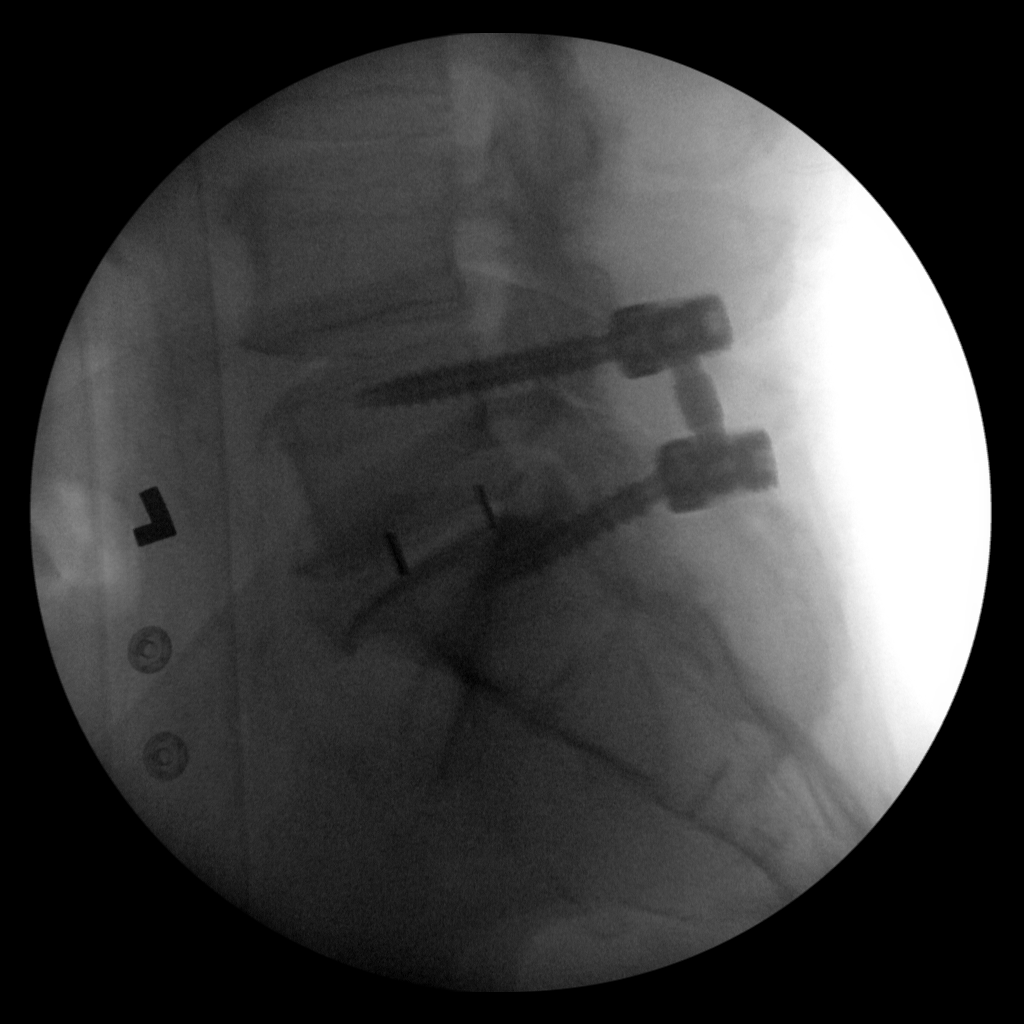
[im 2/2]
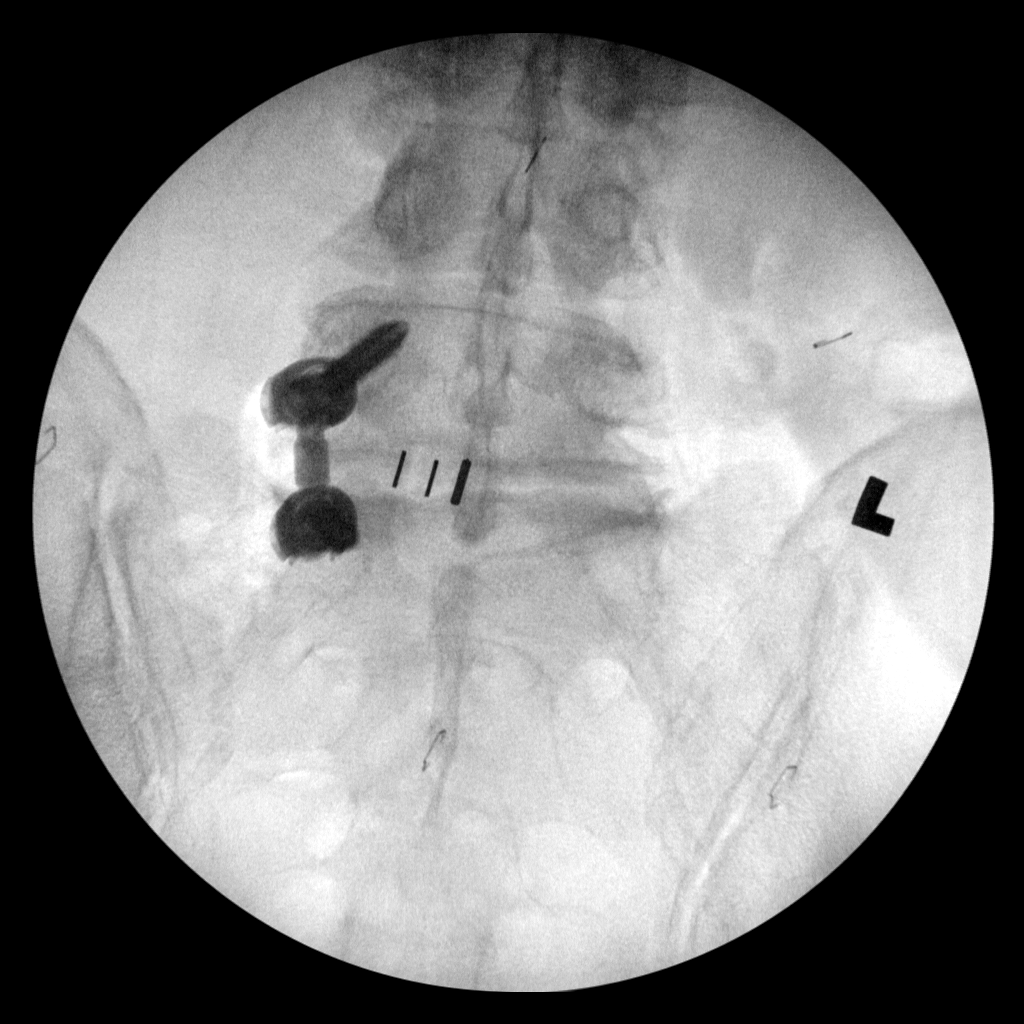

[2 of 2 positions shown; findings below may reference images not displayed]

FINDINGS: Two C-arm images show discectomy at L5-S1 with interbody
fusion material.  There are right-sided pedicle screws with a
posterior connector.  Components appear grossly well positioned.
No discernible complication.
IMPRESSION: Discectomy and fusion L5-S1.

## 2012-01-28 ENCOUNTER — Other Ambulatory Visit: Payer: Self-pay | Admitting: *Deleted

## 2012-01-28 DIAGNOSIS — M79609 Pain in unspecified limb: Secondary | ICD-10-CM

## 2012-02-09 ENCOUNTER — Encounter: Payer: Self-pay | Admitting: Vascular Surgery

## 2012-02-23 ENCOUNTER — Encounter: Payer: Self-pay | Admitting: Vascular Surgery

## 2012-02-24 ENCOUNTER — Encounter: Payer: Self-pay | Admitting: Vascular Surgery

## 2012-02-24 ENCOUNTER — Encounter (INDEPENDENT_AMBULATORY_CARE_PROVIDER_SITE_OTHER): Payer: Medicare Other | Admitting: *Deleted

## 2012-02-24 ENCOUNTER — Ambulatory Visit (INDEPENDENT_AMBULATORY_CARE_PROVIDER_SITE_OTHER): Payer: Medicare Other | Admitting: Vascular Surgery

## 2012-02-24 VITALS — BP 121/47 | HR 82 | Resp 16 | Wt 181.6 lb

## 2012-02-24 DIAGNOSIS — M79609 Pain in unspecified limb: Secondary | ICD-10-CM

## 2012-02-24 DIAGNOSIS — I739 Peripheral vascular disease, unspecified: Secondary | ICD-10-CM | POA: Insufficient documentation

## 2012-02-24 DIAGNOSIS — R0989 Other specified symptoms and signs involving the circulatory and respiratory systems: Secondary | ICD-10-CM

## 2012-02-24 HISTORY — DX: Peripheral vascular disease, unspecified: I73.9

## 2012-02-24 HISTORY — DX: Pain in unspecified limb: M79.609

## 2012-02-24 NOTE — Progress Notes (Signed)
V[facility] and Vein Specialist of [place]   Patient name: [name] [name] MRN: [id number] DOB: 1940/10/30 Sex: male   Referred by: Leonor Liv  Reason for referral:  Chief Complaint  Patient presents with  . PVD    C/O of Bilateral pain with sitting, walking and lying flat. Swelling of right leg and discoloration duration 3 years.    HISTORY OF PRESENT ILLNESS: Patient is a 71 year old gentleman known to our practice from a prior right carotid endarterectomy by Dr. Madilyn Fireman in 2002. I do not have the records from this admission but apparently the patient had a complication with a posterior circulation stroke and facial nerve palsy around the time of surgery. He does have a long history of different degenerative disc disease with multiple prior back surgeries and also implantable stimulator for pain control. He does have pain in his lower Shoney's. He has a chronic back pain extending into his hips and also has numbness and pain in his feet his chronic as well. He does have some calf claudication on questioning but that this is only mildly limiting to him do to the other multiple difficulties. He did have the nail bed infections right great toe and had the removal of his nail approximately 2-3 weeks ago. This has a good healing. He has no other history of tissue loss.  Past Medical History  Diagnosis Date  . COPD (chronic obstructive pulmonary disease)   . Coronary artery disease   . Stroke   . Myocardial infarction     X's 2  . Peripheral vascular disease   . Bell's palsy   . Carotid artery occlusion     Past Surgical History  Procedure Date  . Eye surgery 1949  . Finger amputation 1960    Right  thumb  . Angioplasty Dec. 2001    with stent  . Catherization June 2002    Cardiac  . Rotator cuff repair 2007    Right  shoulder  . Colon surgery Jan. 2009    Ischemic  . Spine surgery Feb. 2001  . Spinal fusion Sept. 30, 2012  .  stimulator Aug. 6, 2012    Implantation of Spinal  Stimulator  . Cholecystectomy     Gall Bladder- llaproscopic  . Carotid endarterectomy Aug. 2002    RIGHT  cea    History   Social History  . Marital Status: Married    Spouse Name: N/A    Number of Children: N/A  . Years of Education: N/A   Occupational History  . Not on file.   Social History Main Topics  . Smoking status: Former Smoker    Types: Cigarettes    Start date: 02/24/2001  . Smokeless tobacco: Never Used  . Alcohol Use: No  . Drug Use: No  . Sexually Active: Not on file   Other Topics Concern  . Not on file   Social History Narrative  . No narrative on file    Family History  Problem Relation Age of Onset  . Hypertension Mother   . Heart disease Father   . Hyperlipidemia Father   . Hypertension Father   . Heart attack Father   . Cancer Sister     Breast  . Hypertension Sister   . Hyperlipidemia Brother   . Cancer Daughter     Breast  . Deep vein thrombosis Son   . Heart disease Son     Heart Disease before age 72  . Hyperlipidemia Son   . Hypertension Son   .  Heart attack Son     Allergies as of 02/24/2012 - Review Complete 02/24/2012  Allergen Reaction Noted  . Codeine Other (See Comments) 05/09/2010  . Percocet (oxycodone-acetaminophen) Anaphylaxis 02/24/2012  . Promethazine hcl Other (See Comments) 05/09/2010  . Morphine and related Rash 02/24/2012  . Lyrica (pregabalin)  02/24/2012    Current Outpatient Prescriptions on File Prior to Visit  Medication Sig Dispense Refill  . citalopram (CELEXA) 20 MG tablet daily.      Marland Kitchen escitalopram (LEXAPRO) 10 MG tablet Take 10 mg by mouth daily.      . ramipril (ALTACE) 10 MG capsule daily.      Marland Kitchen spironolactone-hydrochlorothiazide (ALDACTAZIDE) 25-25 MG per tablet daily.         REVIEW OF SYSTEMS:  Positives indicated with an "X"  CARDIOVASCULAR:  [ ]  chest pain   [ ]  chest pressure   [ ]  palpitations   [ ]  orthopnea   [ ]  dyspnea on exertion   [x ] claudication   [x ] rest pain   [ ]   DVT   [ ]  phlebitis PULMONARY:   [ ]  productive cough   [ ]  asthma   [ ]  wheezing NEUROLOGIC:   [ ]  weakness  [ ]  paresthesias  [ ]  aphasia  [ ]  amaurosis  [ ]  dizziness HEMATOLOGIC:   [ ]  bleeding problems   [ ]  clotting disorders MUSCULOSKELETAL:  [ ]  joint pain   [ ]  joint swelling GASTROINTESTINAL: [ ]   blood in stool  [ ]   hematemesis GENITOURINARY:  [ ]   dysuria  [ ]   hematuria PSYCHIATRIC:  [ ]  history of major depression INTEGUMENTARY:  [ ]  rashes  [ ]  ulcers CONSTITUTIONAL:  [ ]  fever   [ ]  chills  PHYSICAL EXAMINATION:  General: The patient is a well-nourished male, in no acute distress. Vital signs are BP 121/47  Pulse 82  Resp 16  Wt 181 lb 9.6 oz (82.373 kg)  SpO2 97% Pulmonary: There is a good air exchange bilaterally without wheezing or rales. Abdomen: Soft and non-tender with normal pitch bowel sounds. Musculoskeletal: There are no major deformities.  There is no significant extremity pain. Neurologic: No focal weakness or paresthesias are detected, Skin: There are no ulcer or rashes noted. He does have good healing of his removed right great toenail Psychiatric: The patient has normal affect. Cardiovascular: There is a regular rate and rhythm without significant murmur appreciated. Carotid arteries without bruits bilaterally. He does have a well-healed right neck incision. Pulse status 2+ radial and femoral pulses. Absent popliteal and distal pulses  VVS Vascular Lab Studies:  Ordered and Independently Reviewed lower surety arterial studies reveal monophasic flow in his posterior tibial and dorsalis pedis pulses bilaterally. Ankle arm index in the right is 0.42 on the left is 0.49  Impression and Plan:   moderate to severe lower surety arterial insufficiency. He symptoms. He does have moderate nonlimiting claudication. I think this could be treated with4 she has an excellent healing of his right toenail removal. He knows to keep a close eye on this will notify  should he develop any tissue loss. He does lnothave  any evidence of arterial rest pain and has multiple other etiologies for his leg pain. He does have moderate lower surety arterial claudication  Recommend conservative treatment with observation of this he should develop progressive tissue loss. He does have serial carotid duplex at Dr.Munley's office and will continue this. We will see him on an as-needed basis  Ethie Curless [facility] [place] Office: [phone]

## 2014-09-19 DIAGNOSIS — I10 Essential (primary) hypertension: Secondary | ICD-10-CM

## 2014-09-19 DIAGNOSIS — I2510 Atherosclerotic heart disease of native coronary artery without angina pectoris: Secondary | ICD-10-CM

## 2014-09-19 DIAGNOSIS — I119 Hypertensive heart disease without heart failure: Secondary | ICD-10-CM | POA: Insufficient documentation

## 2014-09-19 DIAGNOSIS — E785 Hyperlipidemia, unspecified: Secondary | ICD-10-CM | POA: Insufficient documentation

## 2014-09-19 HISTORY — DX: Essential (primary) hypertension: I10

## 2014-09-19 HISTORY — DX: Hypertensive heart disease without heart failure: I11.9

## 2014-09-19 HISTORY — DX: Atherosclerotic heart disease of native coronary artery without angina pectoris: I25.10

## 2014-12-26 NOTE — Patient Outreach (Signed)
[facility]) Care Management  12/26/2014  [place]on [facility]hat[facility] [birth year] [id number]   Referral from [facility], assigned to [name], RN for patient outreach.  Keyira Mondesir L. Bartow Zylstra, AAS [facility] Care Management Assistant

## 2015-01-03 ENCOUNTER — Other Ambulatory Visit: Payer: Self-pay

## 2015-01-03 NOTE — Patient Outreach (Signed)
[facility]) Care Management  01/03/2015  [facility] 20-Sep-1940 [id number]  Telephone call to patient regarding [facility] referral.  Referral request to contact patients spouse.  Unable to reach.  HIPAA compliant voice message left with call back phone number.   PLAN: RNCM will attempt 2nd telephone outreach to patients spouse within 3 business days.   [name] RN,BSN,CCM [facility] Telephonic Care Coordinator [phone]

## 2015-01-08 ENCOUNTER — Other Ambulatory Visit: Payer: Self-pay

## 2015-01-08 NOTE — Patient Outreach (Signed)
[facility]) Care Management  01/08/2015  [name] [name] [facility] 1940/05/05 [id number]  SUBJECTIVE:  Telephone call to patients wife, [name] [name] as per [facility] referral.  Wife states she is at the emergency room with patient at this time and is unable to talk.  Wife agreed to call back from [facility] at later time.    ASSESSEMENT:  [facility] referral, consent to speak with patients wife, [name] [name] per [facility] referral.   PLAN; RNCM will attempt call back to patient's spouse within 3 business days.   [name] RN,BSN,CCM [facility] Telephonic Care Coordinator [phone]

## 2015-01-10 ENCOUNTER — Other Ambulatory Visit: Payer: Self-pay

## 2015-01-10 NOTE — Patient Outreach (Addendum)
[facility]) Care Management  01/10/2015  Austyn Holliday [facility] 1940-02-28 [id number]   SUBJECTIVE: Telephone call to patient/spouse for [facility] referral.  Patient states he is presently in the hospital getting ready for surgery. Telephone call to patients spouse, Altair Rees.  Wife states patient is going to have vascular bypass surgery to right leg on today.  Wife states patient has an ischemic right foot. Wife states patient has problems with his blood pressure as well fluctuating up and down.  RNCM Discussed and offered [facility] care management services. Wife request contact information for [facility] care management be left on home voice mail.  Wife confirmed home phone number to be [phone]. Wife verbalized agreement to receive follow up call from [facility] within 1 week to further discuss services.   PLAN: RNCM will follow up with patient/spouse within 1 week.   [name] RN,BSN,CCM [facility] Telephonic Care Coordinator [phone]

## 2015-01-15 DIAGNOSIS — I2489 Other forms of acute ischemic heart disease: Secondary | ICD-10-CM

## 2015-01-15 DIAGNOSIS — I248 Other forms of acute ischemic heart disease: Secondary | ICD-10-CM

## 2015-01-15 HISTORY — DX: Other forms of acute ischemic heart disease: I24.89

## 2015-01-15 HISTORY — DX: Other forms of acute ischemic heart disease: I24.8

## 2015-01-16 ENCOUNTER — Other Ambulatory Visit: Payer: Commercial Managed Care - HMO

## 2015-01-16 NOTE — Patient Outreach (Signed)
[facility]) Care Management  01/16/2015  [name] [name] [facility] [birth year] [id number]   Telephone call to patient/spouse per [facility] referral. Unable to reach. HIPAA compliant voice message left with call back phone number.   PLAN; RNCM will send patient [facility] care management outreach letter to attempt contact.   [name] RN,BSN,CCM [facility] Telephonic Care Coordinator [phone]

## 2015-01-26 ENCOUNTER — Other Ambulatory Visit: Payer: Self-pay

## 2015-01-26 NOTE — Patient Outreach (Signed)
[facility]) Care Management  01/26/2015  [name] [name] [facility] 12-Jul-1940 [id number]  SUBJECTIVE: Telephone call to patient/wife regarding [facility] referral.  Wife states patient is presently in the nursing home. Wife states patient had a femoral bypass with complications.  States she is unsure at this time how long patient will be in the nursing home.  Wife states she is on her way to the nursing home to see her patient at this time and request call back later on this afternoon.    RNCM: Will follow up with patient/spouse on today as requested.   [name] Ina RN,BSN,CCM [facility] Telephonic Care Coordinator [phone]

## 2015-01-30 ENCOUNTER — Other Ambulatory Visit: Payer: Self-pay

## 2015-01-30 NOTE — Patient Outreach (Signed)
[facility]) Care Management  01/30/2015  [facility] [birth year] [id number]   Telephone call to patients wife, Finis Howard.  Unable to reach. HIPAA compliant voice message left with call back phone number.  PLAN; RNCM will send [facility] care management outreach letter to attempt contact.   [name] RN,BSN,CCM [facility] Telephonic Care Coordinator [phone]

## 2015-01-31 ENCOUNTER — Other Ambulatory Visit: Payer: Self-pay

## 2015-01-31 DIAGNOSIS — I739 Peripheral vascular disease, unspecified: Secondary | ICD-10-CM

## 2015-01-31 NOTE — Patient Outreach (Signed)
[facility]) Care Management  01/31/2015  [name] [name] [facility] 1941-02-16 [id number]   SUBJECTIVE: Telephone call to patient/ wife regarding [facility] referral.  HIPAA verified with patient.  Patient gave verbal authorization to speak with his wife [name] [name] regarding all of his medical information. Wife states patient was previously at [facility] for femoral bypass surgery.  Wife states patient had some complications and is now in [facility] receiving care.  Wife states she is unsure when patient will be discharged.  Wife states she has not met with the care team at [facility] to discuss discharged arrangements.  Wife states patient continues to progress and is still receiving wound care, physical therapy and occupational therapy.  Wife verbally agreed to received [facility] care management services for patient.    PLAN: RNCM will refer patient to social worker to follow.

## 2015-02-02 ENCOUNTER — Other Ambulatory Visit: Payer: Self-pay | Admitting: Licensed Clinical Social Worker

## 2015-02-02 NOTE — Patient Outreach (Signed)
[facility]) Care Management  02/02/2015  [name] [name] 1940-03-06 [id number]   Assessment-CSW completed outreach to patient' wife after receiving referral. Patient's spouse answered and provided HIPPA verifications. CSW introduced self, reason for call and of the [facility] program. Patient's spouse is agreeable to services. Spouse reports " He is doing good in physical therapy and had an appointment with his cardiologist and is pleased with his progress." Spouse is agreeable to CSW completing SNF visit on 02/06/15. Patient's spouse will be there during visit.  Plan-CSW will complete SNF visit on 02/06/15 at [facility] and will assist patient and family with discharge planning.  [name] [name], BSW, MSW, LCSW [employer] [name] Phone: [phone] Fax: [phone]

## 2015-02-06 ENCOUNTER — Other Ambulatory Visit: Payer: Self-pay | Admitting: Licensed Clinical Social Worker

## 2015-02-06 NOTE — Patient Outreach (Signed)
  [facility]) Care Management  [facility] Social Work  02/06/2015  [name] [name] [facility] August 26, 1940 [id number]  Assessment: CSW was unable to complete SNF visit today. CSW arrived to [facility] at [facility] and was informed [facility] has three different SNF locations and that patient is at [facility]. CSW contacted patient's wife who was understanding of this miscommunication and appointment was reschedule for 02/08/15 at 10:30 am. Patient's wife will be present during this visit. Patient's wife informed CSW that discharge date has been set for 02/09/15.  Plan: CSW will complete SNF visit on 02/08/15. CSW will update RNCM on discharge date.  [name] [name], BSW, MSW, LCSW [facility] [name] Phone: [phone] Fax: [phone]

## 2015-02-08 ENCOUNTER — Encounter: Payer: Self-pay | Admitting: Licensed Clinical Social Worker

## 2015-02-08 ENCOUNTER — Other Ambulatory Visit: Payer: Self-pay | Admitting: Licensed Clinical Social Worker

## 2015-02-08 NOTE — Patient Outreach (Addendum)
[facility]) Care Management  [facility] Social Work  02/08/2015  Koi Zangara [facility] October 27, 1940 [id number]   Current Medications:  Current Outpatient Prescriptions  Medication Sig Dispense Refill  . acetaminophen (TYLENOL) 325 MG tablet Take 650 mg by mouth every 4 (four) hours as needed.    . cholecalciferol (VITAMIN D) 1000 UNITS tablet Take 1,000 Units by mouth 2 (two) times daily.    . citalopram (CELEXA) 20 MG tablet daily.    . cloNIDine (CATAPRES) 0.1 MG tablet Take 0.1 mg by mouth every 8 (eight) hours.    . clopidogrel (PLAVIX) 75 MG tablet Take 75 mg by mouth daily.    . Cyanocobalamin (VITAMIN B 12 PO) Take 1,000 mg by mouth 2 (two) times daily.    . ferrous sulfate 325 (65 FE) MG tablet Take 325 mg by mouth daily with breakfast.    . folic acid (FOLVITE) 1 MG tablet Take 1 mg by mouth daily.    . furosemide (LASIX) 40 MG tablet Take 40 mg by mouth.    Marland Kitchen HYDROcodone-acetaminophen (NORCO/VICODIN) 5-325 MG tablet Take 1 tablet by mouth every 4 (four) hours as needed for moderate pain.    Marland Kitchen HYDROmorphone (DILAUDID) 4 MG tablet Take by mouth every 4 (four) hours as needed for severe pain.    . metoprolol succinate (TOPROL-XL) 100 MG 24 hr tablet Take 100 mg by mouth daily. Take with or immediately following a meal.    . Multiple Vitamin (MULTIVITAMIN) tablet Take 1 tablet by mouth daily.    Marland Kitchen omega-3 acid ethyl esters (LOVAZA) 1 G capsule Take by mouth 2 (two) times daily.    . Omega-3 Fatty Acids (FISH OIL) 1200 MG CAPS Take 1,200 mg by mouth 2 (two) times daily.    . rivaroxaban (XARELTO) 20 MG TABS tablet Take 20 mg by mouth daily with supper.    . rosuvastatin (CRESTOR) 20 MG tablet Take 20 mg by mouth daily.    . sennosides-docusate sodium (SENOKOT-S) 8.6-50 MG tablet Take 2 tablets by mouth daily.    Marland Kitchen sulfamethoxazole-trimethoprim (BACTRIM DS,SEPTRA DS) 800-160 MG tablet Take 1 tablet by mouth 2 (two) times daily.    . tamsulosin (FLOMAX) 0.4 MG CAPS capsule  Take 0.4 mg by mouth.    Marland Kitchen aspirin 325 MG tablet Take 325 mg by mouth daily. Reported on 02/08/2015    . escitalopram (LEXAPRO) 10 MG tablet Take 10 mg by mouth daily. Reported on 02/08/2015    . ramipril (ALTACE) 10 MG capsule daily. Reported on 02/08/2015    . spironolactone-hydrochlorothiazide (ALDACTAZIDE) 25-25 MG per tablet daily. Reported on 02/08/2015     No current facility-administered medications for this visit.    Functional Status:  In your present state of health, do you have any difficulty performing the following activities: 02/08/2015  Hearing? Y  Vision? Y  Difficulty concentrating or making decisions? Y  Walking or climbing stairs? (No Data)  Dressing or bathing? Y  Doing errands, shopping? Y    Fall/Depression Screening:  PHQ 2/9 Scores 02/08/2015  PHQ - 2 Score 3  PHQ- 9 Score 7    Assessment: CSW completed SNF visit at [facility] with patient on 02/08/15. Patient's ride that he use to work with, sister and spouse were in the room when CSW arrived. Patient states "I am feeling a lot better now. I'm ready to go home." CSW completed assessment and both patient and spouse answered questions. Patient and spouse report that they currently have two adult grandson's that  are staying at their residence. Patient was previously at [facility] for femoral bypass surgery and had complications which led to his SNF admission. Patient's spouse and sister share that patient has been making improvements since then and that his physicians are pleased with his progress. Patient reports that he walked 100 feet with his walker the other day. Patient reports that he has lost up to 24 pounds within the past 3 months as he does not have an appetite. Patient shares that he will eat 6 or 7 bites of every meal and then stop. Patient denies any feelings of nausea or sickness while eating. Patient's spouse reports that patient has recently been using boost and ensure in order to gain  his nutrition. Patient has recently joined a program at the [employer] which will assist him in gaining these nutrition supplies. Patient shares that he has both a health care power of attorney and a living will in place. CSW completed medication review but both patient and spouse were unclear on his medications. SNF spouse provided CSW with a list of his active medications. Patient's spouse shares that patient should be on Celexa medication for his depressive symptoms and is unsure why it is not on his medication list. Patient and patient's spouse informed CSW that they have lost two of their adult children. Patient shares that he lost his daughter 9 years ago and his son 1 year ago. Patient's spouse reports that they received grief therapy at one time but patient did not receive any benefits from therapy. CSW questioned if patient would like grief resources and patient denied. Patient reports that his depressive symptoms are related to his recent health decline and the loss of his children.  Patient shares that his antidepressants are effective for him and help him to sleep. Patient shares that he has trouble both falling asleep and staying asleep. Patient communicates that he did not have to use any medical equipment before his surgery. However, family has a walker, wheel chair, handicap ramp, shower chair with back, bedside commode and grab bars in case patient were to need it. Patient currently needs assistance with both ADL'S and IADL'S according to SNF nurse. Patient's spouse transports patient to all of his medical appointments. Patient's spouse cooks meals for patient usually but family has recently completed application for patient with a meal program at the [facility] that will deliver frozen weekly meals to their residence as patient will not be able to cook for himself. Patient is agreeable to CSW making referral to the [facility] [facility] which will deliver 10 frozen heart healthy/no salt added  meals to their residence. Patient's spouse informed CSW that patient will be changing his insurance from [employer] to [employer] starting with the new year. Patient and patient's spouse deny any fears in regards to his upcoming discharge stating "We have everything we need." Patient will discharge back home on 02/09/15. CSW provided patient with a [facility] calendar. CSW completed visit and then spoke to SNF discharge planner Carroll Sage. Steward Drone shares that patient will go home with Home Health which will include physical therapy and an aide to assist him with bathing and getting dressed. CSW informed family that [facility] Pavy will be involved with patient once he has successfully discharge from SNF.   [facility] CM Care Plan Problem One        Most Recent Value   Care Plan Problem One  SNF admission.   Role Documenting  the Problem One  Clinical Social Worker   Care Plan for Problem One  Active   [facility] Long Term Goal (31-90 days)  Patient will have a safe and stable discharge from SNF within 90 days.   [facility] Long Term Goal Start Date  02/08/15   Interventions for Problem One Long Term Goal  CSW will coordinate with SNF discharge planner, spouse and patient to ensure that patient's discharge back home is a smooth transition with all needed supplies and resources.   [facility] CM Short Term Goal #1 (0-30 days)  CSW will make referral to [facility] within 30 days   [facility] CM Short Term Goal #1 Start Date  02/08/15   Interventions for Short Term Goal #1  CSW will contact patient's insurance to make referral in order for patient to receive up to 10 frozen meals post discharge from SNF.       Plan: CSW will send involvement letter to patient's PCP.           CSW will update RNCM Rowe Pavy on patient's upcoming discharge from SNF.           CSW will make referral to [facility].           CSW will remain available to patient and patient's family for all social work needs.            CSW will make outreach next week to follow up on discharge.  [name], BSW, MSW, LCSW [employer] [email or web address] Phone: [phone] Fax: [phone]

## 2015-02-09 ENCOUNTER — Other Ambulatory Visit: Payer: Self-pay | Admitting: Licensed Clinical Social Worker

## 2015-02-09 NOTE — Patient Outreach (Signed)
[facility]) Care Management  02/09/2015  BERTRAM GIPE Jul 21, 1940 [id number]   Assessment- CSW completed call to the [facility] program and successfully made referral to program. Patient will receive 20 heart healthy and no salt added frozen meals on 02/15/15 by [employer] delivery to his residence.  Plan-CSW will follow up with patient and post discharge from SNF by 02/15/15.  [name], BSW, MSW, LCSW [employer] [email or web address] Phone: [phone] Fax: [phone]

## 2015-02-12 ENCOUNTER — Other Ambulatory Visit: Payer: Self-pay

## 2015-02-12 NOTE — Patient Outreach (Signed)
Transition of care: Discharged from SNF rehab on 12/16. [facility].  Placed call to patient who answered the phone. Identified himself. Patient reports that he is doing very well. Patient ask that I speak with wife [name] [name].  Spoke with [name] [name] who states that patient is doing well. Reports that wounds are healing and stitches were removed on 02/08/2015. States that steri strips are now falling off.  Wife reports that patient is ambulating well and having regular bowel movements.  Wife reports that [facility] health came to see patient on 02/10/2015 and nurse is coming today. Wife reports that physical therapy will come on 02/13/2015.   Wife states that patient has follow up with primary MD planned for Jan. 3rd. Will will drive.  PLAN: Reviewed [facility] program with wife and she verbalized understanding. Offered home visit and patient and wife accepted for 02/21/2015 at 11am. I confirmed address. Provided my contact information and 4 hour nurse line telephone number. Reminded wife to call MD for any concerns.  Rowe Pavy, RN, BSN, CEN [facility] [employer] [phone]

## 2015-02-21 ENCOUNTER — Other Ambulatory Visit: Payer: Self-pay

## 2015-02-21 NOTE — Patient Outreach (Signed)
[facility]) Care Management   02/21/2015  [name] [name] 04/14/1940 [id number]  [name] [name] is an 74 y.o. male 11am Arrived for home visit. [name], wife present for home visit and actively engaged. Subjective: Patient reports that he is doing well since he got home from [facility]. Reports that he is getting stronger. Reports that he continues to have pain in the right leg.  Concern during today's home visit is running low on pain medications. Wife and patient report that patient is taking about 2 doses of Dilaudid 4mg  daily.  Currently only has 1  "4mg " tablet left but has 32 "2" mg tablets.  Reviewed with patient that this amount will last until he will see primary MD on 02/27/2015. Patient to discuss pain medications at that time with primary MD.   Patient reports a recent weight loss of 23 pounds but feels like his appetite is starting to get better. Reports that he is happy with his current weight loss. Patient and wife discuss the lose of their adult children in the last few years and how difficult the holiday and upcoming months are. ( birthday months for deceased son and daughter).  Wife reports that patient is doing well with his exercises and is making progress. Patient is currently not using any assistive devices when ambulating at home.  Objective:  Arrived to find patient sitting in recliner with right leg propped on 2 pillows. Awake and alert.  Right leg has 2 plus edema. Right foot with discolored great toenail which appears thick. Wife reports toe is improved. Skin to the right leg is dry. Skin to the right foot is cracked and dry.  Patient ambulating slowly in the home. Appears to have good balance. Home is well kept and neat.   Filed Vitals:   02/21/15 1135  BP: 140/62  Pulse: 71  Resp: 18  Height: 1.676 m (5\' 6" )  Weight: 164 lb (74.39 kg)  SpO2: 98%   Review of Systems  Constitutional: Negative.   HENT: Negative.   Eyes:       Reports change in vision  since femoral bypass.  Described floater. Has seen eye doctor recently.  Respiratory: Negative.   Cardiovascular: Positive for leg swelling.       Right leg 2 plus edema. No swelling in the left leg  Gastrointestinal: Negative.        Denies nausea, vomiting and diarrhea  Genitourinary:       Denies any urinary problems at this time  Musculoskeletal: Positive for joint pain. Negative for falls.       Right leg pain  Skin: Negative.   Neurological: Negative.   Endo/Heme/Allergies: Negative.   Psychiatric/Behavioral: Positive for depression. The patient is nervous/anxious.     Physical Exam  Constitutional: He is oriented to person, place, and time. He appears well-developed and well-nourished.  Cardiovascular: Normal rate, regular rhythm, normal heart sounds and intact distal pulses.   Bounding pulses to both arm.  Pulses present to both feet. Both feet arm warm and dry to the touch,  Respiratory: Effort normal and breath sounds normal.  GI: Soft. Bowel sounds are normal.  Musculoskeletal: He exhibits edema and tenderness.  Right leg swollen and tender  Neurological: He is alert and oriented to person, place, and time.  Skin: Skin is warm and dry.  Incision site to the right lower leg intact.  Right great toe nail is elevated and thick. Right foot with very dry and cracked skin  Psychiatric:  He has a normal mood and affect. His behavior is normal. Judgment and thought content normal.    Current Medications:   Current Outpatient Prescriptions  Medication Sig Dispense Refill  . acetaminophen (TYLENOL) 325 MG tablet Take 650 mg by mouth every 4 (four) hours as needed.    . cholecalciferol (VITAMIN D) 400 units TABS tablet Take 1,000 tablets by mouth 1 day or 1 dose.    . citalopram (CELEXA) 20 MG tablet daily.    . clopidogrel (PLAVIX) 75 MG tablet Take 75 mg by mouth daily.    . ferrous sulfate 325 (65 FE) MG tablet Take 325 mg by mouth 2 (two) times daily after a meal.     .  folic acid (FOLVITE) 1 MG tablet Take 1 mg by mouth daily.    . furosemide (LASIX) 40 MG tablet Take 40 mg by mouth.    Marland Kitchen HYDROcodone-acetaminophen (NORCO/VICODIN) 5-325 MG tablet Take 1 tablet by mouth every 4 (four) hours as needed for moderate pain.    Marland Kitchen HYDROmorphone (DILAUDID) 4 MG tablet Take 4 mg by mouth every 4 (four) hours as needed for severe pain.     . Multiple Vitamin (MULTIVITAMIN) tablet Take 1 tablet by mouth daily.    . Omega-3 Fatty Acids (FISH OIL) 1200 MG CAPS Take 1,200 mg by mouth 2 (two) times daily.    . Omega-3 Fatty Acids (FISH OIL) 1200 MG CPDR Take 2 capsules by mouth.    . ondansetron (ZOFRAN-ODT) 4 MG disintegrating tablet Take 4 mg by mouth every 4 (four) hours. As needed only    . rivaroxaban (XARELTO) 20 MG TABS tablet Take 20 mg by mouth daily with supper.    . rosuvastatin (CRESTOR) 20 MG tablet Take 20 mg by mouth daily.    . sennosides-docusate sodium (SENOKOT-S) 8.6-50 MG tablet Take 2 tablets by mouth daily.    . tamsulosin (FLOMAX) 0.4 MG CAPS capsule Take 0.4 mg by mouth. Reported on 02/21/2015    . vitamin B-12 (CYANOCOBALAMIN) 1000 MCG tablet Take 1,000 mcg by mouth daily.    Marland Kitchen aspirin 325 MG tablet Take 325 mg by mouth daily. Reported on 02/21/2015    . cholecalciferol (VITAMIN D) 1000 UNITS tablet Take 1,000 Units by mouth 2 (two) times daily. Reported on 02/21/2015    . cloNIDine (CATAPRES) 0.1 MG tablet Take 0.1 mg by mouth every 8 (eight) hours. Reported on 02/21/2015    . Cyanocobalamin (VITAMIN B 12 PO) Take 1,000 mg by mouth 2 (two) times daily. Reported on 02/21/2015    . escitalopram (LEXAPRO) 10 MG tablet Take 10 mg by mouth daily. Reported on 02/21/2015    . metoprolol succinate (TOPROL-XL) 100 MG 24 hr tablet Take 100 mg by mouth daily. Reported on 02/21/2015    . ramipril (ALTACE) 10 MG capsule daily. Reported on 02/21/2015    . spironolactone-hydrochlorothiazide (ALDACTAZIDE) 25-25 MG per tablet daily. Reported on 02/21/2015    .  sulfamethoxazole-trimethoprim (BACTRIM DS,SEPTRA DS) 800-160 MG tablet Take 1 tablet by mouth 2 (two) times daily. Reported on 02/21/2015     No current facility-administered medications for this visit.    Functional Status:   In your present state of health, do you have any difficulty performing the following activities: 02/21/2015 02/08/2015  Hearing? Malvin Johns  Vision? Y Y  Difficulty concentrating or making decisions? Malvin Johns  Walking or climbing stairs? Y (No Data)  Dressing or bathing? N Y  Doing errands, shopping? Malvin Johns    Fall/Depression  Screening:    PHQ 2/9 Scores 02/21/2015 02/08/2015  PHQ - 2 Score 6 3  PHQ- 9 Score 14 7   Fall Risk  02/21/2015 02/08/2015  Falls in the past year? Yes No  Number falls in past yr: 1 -  Injury with Fall? No -  Risk for fall due to : Impaired mobility;Other (Comment) -  Risk for fall due to (comments): mobility is improving. no assistive devices. active with therapy -  Follow up Falls prevention discussed -   Assessment: (1) Reviewed [facility] program. Patient is [facility] with [facility] and is switching to [facility] [employer] on 02/25/2015. Provided [facility] calendar, magnet. Reviewed consent form and patient decline any changes needed. (2) running low on pain medications (3) weight loss of 23 pounds. Drinking boost 3 times per day. (4) positive depression screening. (5) hypertension: Currently off BP meds at this time.  Reviewed importance of low salt diet and need for monitoring of BP weekly to report readings to MD. (6) dry cracked skin to the right leg and foot with leg swelling.  (7) patient has completed advanced directives.  Plan: (1) Provided my contact information. Confirmed consent in chart. (2) Patient to discuss pain medication with primary MD on 02/27/2015 at follow up appointment. (3) Patient will continue to drink 3 boost per day. Will mail boost coupons to patient. Patient to discuss with MD need for prescriptions to help cover cost of  supplements. (4) will notify MD of depression screening. Patient declines to talk with therapist at this time.  Patient will continue to take medications as prescribed and call MD for worsening symptoms. Patient is planning to go to the beach in 2 weeks. (5) Patient will monitor blood pressure twice a week and notify MD if his readings are high.  Today's BP elevated but patient thinks this is related to pain.  Reviewed with patient reasons to call 911. Severe headache, slurred speech, chest pain or any other symptoms that are abnormal for him. (6) Encouraged wife and patient to keep lotion on feet and to report any abnormal pain or discoloration to MD. (7) encouraged patient to take a copy of advanced directives to MD.  Collaborative care planning and goal setting with patient and wife during home visit. Number one goal is avoid a readmission. Next telephone outreach planned for 03/01/2015. Patient to call sooner if needed.    THN CM Care Plan Problem One        Most Recent Value   Care Plan Problem One  Recent discharge from rehab to home.   Role Documenting the Problem One  Care Management Coordinator   Care Plan for Problem One  Active   THN Long Term Goal (31-90 days)  Patient will be able to report no readmission in the next 31 days.   THN Long Term Goal Start Date  02/12/15   Interventions for Problem One Long Term Goal  Home visit completed. Encouraged patient to write questions down to take with him to MD appointment.   THN CM Short Term Goal #1 (0-30 days)  Patient will report seeing primary MD in the next 2 weeks.   THN CM Short Term Goal #1 Start Date  02/12/15   Interventions for Short Term Goal #1  Confirmed patient has follow up appointment and transportation.   THN CM Short Term Goal #2 (0-30 days)  Patient will confirm receipt of boost coupons in the next 2 weeks.   THN CM Short Term Goal #2 Start  Date  02/21/15   Interventions for Short Term Goal #2  Will mail boost coupons to  patient.   THN CM Short Term Goal #3 (0-30 days)  Patient will be able to verbalize blood pressure monitoring atleast 2 times per week for the next 2 weeks   THN CM Short Term Goal #3 Start Date  02/14/15   Interventions for Short Tern Goal #3  Encouraged patient to monitor BP twice a week at different time. Encouraged patient to record reading to take to MD. Reviewed with patient to call MD for abnormal readings.     Rowe Pavy, RN, BSN, CEN [facility] [employer] [phone]

## 2015-02-27 ENCOUNTER — Other Ambulatory Visit: Payer: Self-pay | Admitting: Licensed Clinical Social Worker

## 2015-02-27 DIAGNOSIS — M4800 Spinal stenosis, site unspecified: Secondary | ICD-10-CM | POA: Diagnosis not present

## 2015-02-27 NOTE — Patient Outreach (Addendum)
[facility]) Care Management  02/27/2015  Rosalio Charlet [facility] 1940-05-04 [id number]  Assessment-CSW completed outreach to patient's home number and spouse answered. [name] reports "For someone to be as sick as he was, he has sure made a big turn around." Spouse shares that he is getting up and walking around. Spouse also states that patient continues "to not sleep well but is still feeling well." Patient has an upcoming medical appointment with his PCP in which patient will communicate to doctor his recent sleeping issues. [name] shares that physical therapy has "been going great" and that patient has already built great rapport with his therapist. CSW questioned how the holidays went due to loss of two children. [name] reports "the holidays will never be the same for [place] but we really try to be positive and keep busy." Family continue to deny mental health and grief support resources. [name] shares that just the two of them will be taking a beach trip starting 03/03/15 to 03/10/15 and that they are really looking forward to "taking in all the serenity and peace that the ocean provides." [name] reports that they continue to lean on each other and that a trip with just them two will be a great experience for them. Spouse reports that they received 20 heart healthy and low sodium meals from [facility] which CSW completed referral to. Spouse appreciative of this referral. [name] denies any further social work assistance but is allowing CSW is continue to follow family in order to ensure a continued smooth transition back home from SNF.  Plan-CSW will contact patient in two weeks to gain further updates. CSW will remain available to both patient and family for all social work needs.  [name], BSW, MSW, LCSW [email or web address] Phone: [phone] Fax: [phone]

## 2015-03-01 ENCOUNTER — Other Ambulatory Visit: Payer: Self-pay

## 2015-03-01 NOTE — Patient Outreach (Signed)
Transition of care. Phone call to patient who reports he is doing well. Patient reports that his follow up appointment with MD went well. States that primary MD provided a prescription for pain medication. Patient is planning to continue to follow up with surgeon.  Patient reports continue swelling on the right leg and is now wearing compression hose.  Reports BP was low at MD office but better with Home Health Nurse. Patient reports that he is eating better this week.  Patient and wife a planning for a trip next week. Patient is looking forward to trip.  No new problems or concerns today.  Plan:  Will continue transition of care calls weekly. Next outreach on 03/08/2015  [facility] CM Care Plan Problem One        Most Recent Value   Care Plan Problem One  Recent discharge from rehab to home.   Role Documenting the Problem One  Care Management [place] for Problem One  Active   THN Long Term Goal (31-90 days)  Patient will be able to report no readmission in the next 31 days.   THN Long Term Goal Start Date  02/12/15   Interventions for Problem One Long Term Goal  Encouraged patient to call MD for problems or concerns   THN CM Short Term Goal #1 (0-30 days)  Patient will report seeing primary MD in the next 2 weeks.   THN CM Short Term Goal #1 Start Date  02/12/15   [facility] CM Short Term Goal #1 Met Date  03/01/15 Barrie Folk met]   Interventions for Short Term Goal #1  Confirmed patient has follow up appointment and transportation.   THN CM Short Term Goal #2 (0-30 days)  Patient will confirm receipt of boost coupons in the next 2 weeks.   THN CM Short Term Goal #2 Start Date  02/21/15   Interventions for Short Term Goal #2  Will mail boost coupons to patient.   THN CM Short Term Goal #3 (0-30 days)  Patient will be able to verbalize blood pressure monitoring atleast 2 times per week for the next 2 weeks   THN CM Short Term Goal #3 Start Date  02/14/15   Interventions for Short Tern Goal #3   Reinforced need for monitoring blood pressure and reporting abnormal readings to MD      Tomasa Rand, RN, BSN, CEN [place] Coordinator [phone]

## 2015-03-02 DIAGNOSIS — I2510 Atherosclerotic heart disease of native coronary artery without angina pectoris: Secondary | ICD-10-CM | POA: Diagnosis not present

## 2015-03-02 DIAGNOSIS — Z7901 Long term (current) use of anticoagulants: Secondary | ICD-10-CM | POA: Diagnosis not present

## 2015-03-02 DIAGNOSIS — E785 Hyperlipidemia, unspecified: Secondary | ICD-10-CM | POA: Diagnosis not present

## 2015-03-08 ENCOUNTER — Ambulatory Visit: Payer: Self-pay

## 2015-03-08 ENCOUNTER — Other Ambulatory Visit: Payer: Self-pay

## 2015-03-08 NOTE — Patient Outreach (Signed)
Transition of care call: Placed call to patient for transition of care follow up.Patient reports that he is doing well. No new problems or concerns to report.  PLAN: Will complete one more transition of care call next week and then plan to close.  Rowe Pavy, RN, BSN, CEN [facility] [employer] [phone]

## 2015-03-12 ENCOUNTER — Other Ambulatory Visit: Payer: Self-pay | Admitting: Licensed Clinical Social Worker

## 2015-03-12 DIAGNOSIS — F329 Major depressive disorder, single episode, unspecified: Secondary | ICD-10-CM | POA: Diagnosis not present

## 2015-03-12 DIAGNOSIS — E785 Hyperlipidemia, unspecified: Secondary | ICD-10-CM | POA: Diagnosis not present

## 2015-03-12 DIAGNOSIS — G9341 Metabolic encephalopathy: Secondary | ICD-10-CM | POA: Diagnosis not present

## 2015-03-12 DIAGNOSIS — I252 Old myocardial infarction: Secondary | ICD-10-CM | POA: Diagnosis not present

## 2015-03-12 DIAGNOSIS — J441 Chronic obstructive pulmonary disease with (acute) exacerbation: Secondary | ICD-10-CM | POA: Diagnosis not present

## 2015-03-12 DIAGNOSIS — R05 Cough: Secondary | ICD-10-CM | POA: Diagnosis not present

## 2015-03-12 DIAGNOSIS — N400 Enlarged prostate without lower urinary tract symptoms: Secondary | ICD-10-CM | POA: Diagnosis not present

## 2015-03-12 DIAGNOSIS — Z01 Encounter for other special examination without complaint, suspected or reported diagnosis: Secondary | ICD-10-CM | POA: Diagnosis not present

## 2015-03-12 DIAGNOSIS — R070 Pain in throat: Secondary | ICD-10-CM | POA: Diagnosis not present

## 2015-03-12 DIAGNOSIS — I2510 Atherosclerotic heart disease of native coronary artery without angina pectoris: Secondary | ICD-10-CM | POA: Diagnosis not present

## 2015-03-12 DIAGNOSIS — J9601 Acute respiratory failure with hypoxia: Secondary | ICD-10-CM | POA: Diagnosis not present

## 2015-03-12 DIAGNOSIS — R4182 Altered mental status, unspecified: Secondary | ICD-10-CM | POA: Diagnosis not present

## 2015-03-12 DIAGNOSIS — A419 Sepsis, unspecified organism: Secondary | ICD-10-CM | POA: Diagnosis not present

## 2015-03-12 DIAGNOSIS — R0602 Shortness of breath: Secondary | ICD-10-CM | POA: Diagnosis not present

## 2015-03-12 DIAGNOSIS — I739 Peripheral vascular disease, unspecified: Secondary | ICD-10-CM | POA: Diagnosis not present

## 2015-03-12 DIAGNOSIS — I248 Other forms of acute ischemic heart disease: Secondary | ICD-10-CM | POA: Diagnosis not present

## 2015-03-12 DIAGNOSIS — R748 Abnormal levels of other serum enzymes: Secondary | ICD-10-CM | POA: Diagnosis not present

## 2015-03-12 DIAGNOSIS — J440 Chronic obstructive pulmonary disease with acute lower respiratory infection: Secondary | ICD-10-CM | POA: Diagnosis not present

## 2015-03-12 DIAGNOSIS — R6520 Severe sepsis without septic shock: Secondary | ICD-10-CM | POA: Diagnosis not present

## 2015-03-12 DIAGNOSIS — J189 Pneumonia, unspecified organism: Secondary | ICD-10-CM | POA: Diagnosis not present

## 2015-03-12 DIAGNOSIS — R0689 Other abnormalities of breathing: Secondary | ICD-10-CM | POA: Diagnosis not present

## 2015-03-12 DIAGNOSIS — J1000 Influenza due to other identified influenza virus with unspecified type of pneumonia: Secondary | ICD-10-CM | POA: Diagnosis not present

## 2015-03-12 DIAGNOSIS — K5900 Constipation, unspecified: Secondary | ICD-10-CM | POA: Diagnosis not present

## 2015-03-12 DIAGNOSIS — R0600 Dyspnea, unspecified: Secondary | ICD-10-CM | POA: Diagnosis not present

## 2015-03-12 DIAGNOSIS — G629 Polyneuropathy, unspecified: Secondary | ICD-10-CM | POA: Diagnosis not present

## 2015-03-12 DIAGNOSIS — I4891 Unspecified atrial fibrillation: Secondary | ICD-10-CM | POA: Diagnosis not present

## 2015-03-12 DIAGNOSIS — Z8673 Personal history of transient ischemic attack (TIA), and cerebral infarction without residual deficits: Secondary | ICD-10-CM | POA: Diagnosis not present

## 2015-03-12 NOTE — Patient Outreach (Signed)
[facility]) Care Management  03/12/2015  [name] [name] 06/06/1940 [id number]   Assessment-CSW completed outreach to patient's spouse on 03/12/15. Spouse answered and provided HIPPA verifications. Spouse shares that they are still on vacation and enjoying time together. However, patient has had a cold for the last two days and spouse is considering taking him to urgent care stating "He is just miserable." Spouse reports that patient is having issues with incision in the groin area. Spouse states that she will set up appointment with PCP once they get back from vacation. Spouse reports that patient is walking better and now is putting all of his weight on the entire foot instead of using partial parts of foot to put pressure on while walking. Spouse denies any social work concerns at this time. CSW will complete outreach within two weeks to evaluate if there are any social work needs.  Plan-CSW will complete outreach by 03/27/15. CSW will continue to provide support to patient and family. CSW will assist with any social work needs.  [name], BSW, MSW, LCSW [email or web address] Phone: [phone] Fax: [phone]

## 2015-03-13 DIAGNOSIS — I739 Peripheral vascular disease, unspecified: Secondary | ICD-10-CM | POA: Diagnosis not present

## 2015-03-13 DIAGNOSIS — R0602 Shortness of breath: Secondary | ICD-10-CM | POA: Diagnosis not present

## 2015-03-13 DIAGNOSIS — A419 Sepsis, unspecified organism: Secondary | ICD-10-CM | POA: Diagnosis not present

## 2015-03-13 DIAGNOSIS — I2510 Atherosclerotic heart disease of native coronary artery without angina pectoris: Secondary | ICD-10-CM | POA: Diagnosis not present

## 2015-03-13 DIAGNOSIS — J9601 Acute respiratory failure with hypoxia: Secondary | ICD-10-CM | POA: Diagnosis not present

## 2015-03-13 DIAGNOSIS — J189 Pneumonia, unspecified organism: Secondary | ICD-10-CM | POA: Diagnosis not present

## 2015-03-13 DIAGNOSIS — J441 Chronic obstructive pulmonary disease with (acute) exacerbation: Secondary | ICD-10-CM | POA: Diagnosis not present

## 2015-03-13 DIAGNOSIS — J111 Influenza due to unidentified influenza virus with other respiratory manifestations: Secondary | ICD-10-CM | POA: Diagnosis not present

## 2015-03-13 DIAGNOSIS — I214 Non-ST elevation (NSTEMI) myocardial infarction: Secondary | ICD-10-CM | POA: Diagnosis not present

## 2015-03-14 ENCOUNTER — Ambulatory Visit: Payer: Self-pay | Admitting: Licensed Clinical Social Worker

## 2015-03-14 ENCOUNTER — Other Ambulatory Visit: Payer: Self-pay

## 2015-03-14 DIAGNOSIS — I2510 Atherosclerotic heart disease of native coronary artery without angina pectoris: Secondary | ICD-10-CM | POA: Diagnosis not present

## 2015-03-14 DIAGNOSIS — R0600 Dyspnea, unspecified: Secondary | ICD-10-CM | POA: Diagnosis not present

## 2015-03-14 DIAGNOSIS — A419 Sepsis, unspecified organism: Secondary | ICD-10-CM | POA: Diagnosis not present

## 2015-03-14 DIAGNOSIS — J9601 Acute respiratory failure with hypoxia: Secondary | ICD-10-CM | POA: Diagnosis not present

## 2015-03-14 DIAGNOSIS — J189 Pneumonia, unspecified organism: Secondary | ICD-10-CM | POA: Diagnosis not present

## 2015-03-14 DIAGNOSIS — J9690 Respiratory failure, unspecified, unspecified whether with hypoxia or hypercapnia: Secondary | ICD-10-CM | POA: Diagnosis not present

## 2015-03-14 DIAGNOSIS — R918 Other nonspecific abnormal finding of lung field: Secondary | ICD-10-CM | POA: Diagnosis not present

## 2015-03-14 DIAGNOSIS — J111 Influenza due to unidentified influenza virus with other respiratory manifestations: Secondary | ICD-10-CM | POA: Diagnosis not present

## 2015-03-14 DIAGNOSIS — I10 Essential (primary) hypertension: Secondary | ICD-10-CM | POA: Diagnosis not present

## 2015-03-14 DIAGNOSIS — I739 Peripheral vascular disease, unspecified: Secondary | ICD-10-CM | POA: Diagnosis not present

## 2015-03-14 DIAGNOSIS — E785 Hyperlipidemia, unspecified: Secondary | ICD-10-CM | POA: Diagnosis not present

## 2015-03-14 DIAGNOSIS — I214 Non-ST elevation (NSTEMI) myocardial infarction: Secondary | ICD-10-CM | POA: Diagnosis not present

## 2015-03-14 DIAGNOSIS — J441 Chronic obstructive pulmonary disease with (acute) exacerbation: Secondary | ICD-10-CM | POA: Diagnosis not present

## 2015-03-14 NOTE — Patient Outreach (Signed)
Transition of care call: Placed call to patient and spoke with wife [name] Belflower. Mrs. Centrone reports that [place]. Reports patient was in respiratory distress with fever of 102.5. Patient was sent to the [facility]. Wife reports patient had a mild heart attack and had a cardiac cath today which showed occlusion to a vessel. Reports patient was diagnosed with pneumonia today.Wife reports she has the flu is not allowed to visit.  Son has been with patient.    At this time wife reports unknown plan at this time.   Plan: Notified  Jamesetta Orleans via phone.          Discussed with wife I would call back in a few days to follow up.          Will send MD this note.  Rowe Pavy, RN, BSN, CEN [facility] [employer] [phone]

## 2015-03-15 DIAGNOSIS — A419 Sepsis, unspecified organism: Secondary | ICD-10-CM | POA: Diagnosis not present

## 2015-03-15 DIAGNOSIS — J441 Chronic obstructive pulmonary disease with (acute) exacerbation: Secondary | ICD-10-CM | POA: Diagnosis not present

## 2015-03-15 DIAGNOSIS — J9601 Acute respiratory failure with hypoxia: Secondary | ICD-10-CM | POA: Diagnosis not present

## 2015-03-15 DIAGNOSIS — J189 Pneumonia, unspecified organism: Secondary | ICD-10-CM | POA: Diagnosis not present

## 2015-03-16 DIAGNOSIS — R05 Cough: Secondary | ICD-10-CM | POA: Diagnosis not present

## 2015-03-16 DIAGNOSIS — J441 Chronic obstructive pulmonary disease with (acute) exacerbation: Secondary | ICD-10-CM | POA: Diagnosis not present

## 2015-03-16 DIAGNOSIS — A419 Sepsis, unspecified organism: Secondary | ICD-10-CM | POA: Diagnosis not present

## 2015-03-16 DIAGNOSIS — R0602 Shortness of breath: Secondary | ICD-10-CM | POA: Diagnosis not present

## 2015-03-16 DIAGNOSIS — J9601 Acute respiratory failure with hypoxia: Secondary | ICD-10-CM | POA: Diagnosis not present

## 2015-03-16 DIAGNOSIS — J189 Pneumonia, unspecified organism: Secondary | ICD-10-CM | POA: Diagnosis not present

## 2015-03-17 ENCOUNTER — Other Ambulatory Visit: Payer: Self-pay

## 2015-03-17 DIAGNOSIS — A419 Sepsis, unspecified organism: Secondary | ICD-10-CM | POA: Diagnosis not present

## 2015-03-17 DIAGNOSIS — J189 Pneumonia, unspecified organism: Secondary | ICD-10-CM | POA: Diagnosis not present

## 2015-03-17 DIAGNOSIS — R4182 Altered mental status, unspecified: Secondary | ICD-10-CM | POA: Diagnosis not present

## 2015-03-17 DIAGNOSIS — J441 Chronic obstructive pulmonary disease with (acute) exacerbation: Secondary | ICD-10-CM | POA: Diagnosis not present

## 2015-03-17 DIAGNOSIS — J9601 Acute respiratory failure with hypoxia: Secondary | ICD-10-CM | POA: Diagnosis not present

## 2015-03-17 NOTE — Patient Outreach (Addendum)
Follow up call on 03/16/2015  Spoke with wife who states that patient is getting out of hospital on 03/17/2015.  Will recover in [facility] for 1 week before returning home.  PLAN: Will follow up with patient in 1 week.  Rowe Pavy, RN, BSN, CEN [facility] [employer] [phone]

## 2015-03-18 DIAGNOSIS — J9601 Acute respiratory failure with hypoxia: Secondary | ICD-10-CM | POA: Diagnosis not present

## 2015-03-18 DIAGNOSIS — J189 Pneumonia, unspecified organism: Secondary | ICD-10-CM | POA: Diagnosis not present

## 2015-03-18 DIAGNOSIS — J441 Chronic obstructive pulmonary disease with (acute) exacerbation: Secondary | ICD-10-CM | POA: Diagnosis not present

## 2015-03-18 DIAGNOSIS — A419 Sepsis, unspecified organism: Secondary | ICD-10-CM | POA: Diagnosis not present

## 2015-03-19 DIAGNOSIS — J9601 Acute respiratory failure with hypoxia: Secondary | ICD-10-CM | POA: Diagnosis not present

## 2015-03-19 DIAGNOSIS — J189 Pneumonia, unspecified organism: Secondary | ICD-10-CM | POA: Diagnosis not present

## 2015-03-19 DIAGNOSIS — J441 Chronic obstructive pulmonary disease with (acute) exacerbation: Secondary | ICD-10-CM | POA: Diagnosis not present

## 2015-03-19 DIAGNOSIS — A419 Sepsis, unspecified organism: Secondary | ICD-10-CM | POA: Diagnosis not present

## 2015-03-20 ENCOUNTER — Other Ambulatory Visit: Payer: Self-pay

## 2015-03-20 NOTE — Patient Outreach (Addendum)
Transition of care call:  [place] 03/19/2015 [place] call to patient and spoke with wife who reports that patient was discharge home from hospital at [facility] yesterday. Wife reports that patient has finished antibiotics and is feeling some better. Only complaints at this time is loss of appetite and weakness. Wife reports that patient is drinking boost and soup. Wife and patient plan to return home to [place] on 03/21/2015.   Encouraged wife to call and make a hospital follow up appointment. Wife reports one new medication of Norvasc 2.5 mg once a day.  PLAN: Will continue transition of care calls weekly. Next call planned for 03/26/2015             Will send this update to primary MD.  [facility] CM Care Plan Problem One        Most Recent Value   Care Plan Problem One  Recent discharge from rehab to home.   Role Documenting the Problem One  Care Management [place] for Problem One  Active   THN Long Term Goal (31-90 days)  Patient will be able to report no readmission in the next 31 days.   THN Long Term Goal Start Date  02/12/15   [facility] Long Term Goal Met Date  03/14/15 Barrie Folk met of 31 days. readmitted at day 32]   Interventions for Problem One Long Term Goal  Reviewed with patient reasons to call MD. he is doing well.    THN CM Short Term Goal #1 (0-30 days)  Patient will report seeing primary MD in the next 2 weeks.   THN CM Short Term Goal #1 Start Date  02/12/15   [facility] CM Short Term Goal #1 Met Date  03/01/15 Barrie Folk met]   Interventions for Short Term Goal #1  Confirmed patient has follow up appointment and transportation.   THN CM Short Term Goal #2 (0-30 days)  Patient will confirm receipt of boost coupons in the next 2 weeks.   THN CM Short Term Goal #2 Start Date  02/21/15   [facility] CM Short Term Goal #2 Met Date  03/14/15 Barrie Folk met]   Interventions for Short Term Goal #2  Reassess for need of any more boost coupons   THN  CM Short Term Goal #3 (0-30 days)  Patient will be able to verbalize blood pressure monitoring atleast 2 times per week for the next 2 weeks   THN CM Short Term Goal #3 Start Date  02/14/15   [facility] CM Short Term Goal #3 Met Date  03/14/15 Barrie Folk met]   Interventions for Short Tern Goal #3  reports doing well. Encouraged patient to call MD if needed.    THN CM Care Plan Problem Two        Most Recent Value   Care Plan Problem Two  Recent admission related to pneumonia   Role Documenting the Problem Two  Care Management Coordinator   Care Plan for Problem Two  Active   Interventions for Problem Two Long Term Goal   Reviewed importance of taking all meds as prescribed. Discussed importance of timely follow up with MD..   [facility] Long Term Goal (31-90) days  Patient will no readmission in the next 31 days.   THN Long Term Goal Start Date  03/20/15   THN CM Short Term Goal #1 (0-30 days)  Patient will report hospital follow up with MD in 1 week   [facility]  CM Short Term Goal #1 Start Date  03/20/15   Interventions for Short Term Goal #2   Encourged wife to call and make follow up MD appointment. Will send note to MD to advise about discharge from hospital      Tomasa Rand, RN, BSN, [facility] [employer] [phone]

## 2015-03-23 ENCOUNTER — Other Ambulatory Visit: Payer: Self-pay

## 2015-03-26 ENCOUNTER — Other Ambulatory Visit: Payer: Self-pay

## 2015-03-26 ENCOUNTER — Other Ambulatory Visit: Payer: Self-pay | Admitting: Licensed Clinical Social Worker

## 2015-03-26 NOTE — Patient Outreach (Signed)
[facility]) Care Management  03/26/2015  [name] [name] 08-Jan-1941 [id number]   Assessment-CSW completed outreach to patient's spouse on 03/26/15. Spouse answered. Spouse reports similar reports that she had informed RNCM. Family arrived back to their residence on 03/23/15. Spouse reports that hospital staff at [facility] were very helpful and friendly which made family feel more comfortable. Patient continues to suffer from weakness. Spouse reports that his appetite has improved over the last few days but that it is still low. Patient using boost still. Spouse shares that patient is not having difficulty with sleep at this time reporting "He sleeps throughout the night and takes several naps during the day." Spouse reports that he is walking with his durable medical equipment. CSW questioned patient's mood and spouse shares that he is staying positive and hopeful. Spouse reports that she is impressed with his progress. CSW encouraged spouse to contact PCP in order to schedule appointment. Spouse reports that she will contact office today to see if patient can be seen this afternoon. CSW informed spouse that RNCM will be in touch this week.  Plan-CSW will continue to provide support and social work assistance to patient and family. CSW will make next outreach by 04/06/15.  [name], BSW, MSW, LCSW [email or web address] Phone: [phone] Fax: [phone]

## 2015-03-26 NOTE — Patient Outreach (Signed)
Transition of care call: Placed call to patient and spoke with wife who reports they are home from [place] and patient is doing well with the exception of feeling weak. Reports patient continues to drink boost for supplements.  Wife reports that patient continues to have a cough.  But states that it is "not bad"   Reports patient is sleeping well. Reports that patient has weaned himself off pain meds.   Plan: Patient has follow up appointment with primary MD on 03/28/2015. Wife to drive.            Will continue transition of care calls weekly.  Next call in 7 days.  Rowe Pavy, RN, BSN, CEN [facility] [employer] [phone]

## 2015-03-28 DIAGNOSIS — J111 Influenza due to unidentified influenza virus with other respiratory manifestations: Secondary | ICD-10-CM | POA: Diagnosis not present

## 2015-04-02 ENCOUNTER — Ambulatory Visit: Payer: Self-pay

## 2015-04-02 ENCOUNTER — Other Ambulatory Visit: Payer: Self-pay

## 2015-04-02 NOTE — Patient Outreach (Signed)
Transition of care call: Spoke with patient who reports that he is feeling stronger. States that he attended his follow up with his primary MD and patient reports he has low iron.   States that he is not taking any new medications. Patient reports that he has a follow up with the vein surgeon soon. Reports that he continues to have swelling on the right leg. Reports that he has been walking a lot to gain his strength.   Patient denies any concerns today.  PLAN: Will continue to follow patient weekly for transition of care calls. Encouraged patient to call sooner if needed.   Rowe Pavy, RN, BSN, CEN [facility] [employer] [phone]

## 2015-04-05 ENCOUNTER — Other Ambulatory Visit: Payer: Self-pay | Admitting: Licensed Clinical Social Worker

## 2015-04-05 NOTE — Patient Outreach (Signed)
[facility]) Care Management  04/05/2015  [name] [name] [birth year] [id number]   Assessment-CSW completed outreach to patient's spouse. Spouse answered and provided HIPPA verifications. Spouse reports that he is doing better but that "He has an odd walking gait. It's like he is on a ship. He goes back and forth and lurches forward but he is not having to really use any aides." Spouse states that he continues to her an elbow crutch when he out of the house. She reports that his leg swelling has gone down. She reports that she is interested in patient gaining physical therapy. She states that his walking has actually getting worse. CSW will update RNCM.   CSW questioned spouse if patient is in need of any further social work assistance. Spouse declines stating that they continue to receive free ensure supplements and free frozen meals from [facility]. Spouse reports that her sister in law picks up those supplies for her. Spouse reports that he likes the frozen meals. CSW educated her on additional programs that they offer but spouse declines interest in the adult day program or the transportation services. However, spouse is agreeable to [place] mailing out information on program to their residence in case they would like to use their services in the future. Spouse and patient are agreeable to social work discharge at this time as all goals have been met. Spouse appreciative of CSW's encouragement and support.  Plan-CSW will update RNCM and will complete discharge at this time. CSW will send out request for community resource information to be mailed to patient's residence.  Eula Fried, BSW, MSW, [email or web address] Phone: [phone] Fax: [phone]

## 2015-04-06 DIAGNOSIS — Z48812 Encounter for surgical aftercare following surgery on the circulatory system: Secondary | ICD-10-CM | POA: Diagnosis not present

## 2015-04-06 NOTE — Patient Outreach (Signed)
[facility]) Care Management  04/06/2015  Jamarrion Longaker [facility] 1940-04-03 [id number]   Request received from [name], LCSW to mail patient information on [facility]. Information mailed today, 04/06/15.   Wynona Canes  [facility] Care Management Assistant

## 2015-04-09 ENCOUNTER — Other Ambulatory Visit: Payer: Self-pay

## 2015-04-09 NOTE — Patient Outreach (Signed)
Transition of care call:  Placed call to patient who reports that he is doing okay. States that he is having increased problems with ambulation. Reports tightness in his calf and right foot. States that he has pain in his right great toe like he did when he had problems with blood flow. Patient reports that he will see his vascular surgeon in about 2 weeks.    Assessment: Encouraged patient to call primary MD ASAP to make and appointment to be seen today as I was concerned about his complaints. Patient agreed. I encouraged patient to call me back if he has difficulty making appointment.  PLAN: Will follow up with patient in 2 days.  Rowe Pavy, RN, BSN, CEN [facility] [employer] [phone]

## 2015-04-11 ENCOUNTER — Other Ambulatory Visit: Payer: Self-pay

## 2015-04-11 NOTE — Patient Outreach (Signed)
Transition of care call: 3pm  Spoke with patient who reports that he has not called Dr. Elenor Legato office to get an appointment. Reports that he continues to have pain and his right foot  Remains cold. Again expressed my concern about his circulation to his foot. Offered to call MD office and make an appointment and patient has agreed to my assistance with appointment.  3:10pm  Placed call to [facility] regarding an appointment for patient Dr. Leonor Liv without available appointments until Monday 04/16/2015 at 2:20pm  I inquired if patient could see another MD sooner. Office staff states they will check and call me back.  4:10pm  Phone call back from MD office. Spoke with [facility] who will talk with Dr. Leonor Liv tomorrow morning and attempt to work patient in to be seen tomorrow. Amber voiced that she would call me back on 2/16 with appointment date and time.  4:15pm Placed call to patient and provided above update about appointment. PLAN: Follow up on appointment tomorrow.    Rowe Pavy, RN, BSN, CEN [facility] [employer] [phone]

## 2015-04-18 DIAGNOSIS — I739 Peripheral vascular disease, unspecified: Secondary | ICD-10-CM | POA: Diagnosis not present

## 2015-04-18 DIAGNOSIS — M79604 Pain in right leg: Secondary | ICD-10-CM | POA: Diagnosis not present

## 2015-04-18 DIAGNOSIS — T82858A Stenosis of vascular prosthetic devices, implants and grafts, initial encounter: Secondary | ICD-10-CM | POA: Diagnosis not present

## 2015-04-18 DIAGNOSIS — I779 Disorder of arteries and arterioles, unspecified: Secondary | ICD-10-CM | POA: Diagnosis not present

## 2015-04-19 ENCOUNTER — Other Ambulatory Visit: Payer: Self-pay

## 2015-04-19 NOTE — Patient Outreach (Signed)
Transition of care call: 4:26pm  Placed call to patient for transition of care. No answer. Left a message requesting a call back.  Rowe Pavy, RN, BSN, CEN [facility] [employer] [phone]

## 2015-04-23 ENCOUNTER — Other Ambulatory Visit: Payer: Self-pay

## 2015-04-23 DIAGNOSIS — I2510 Atherosclerotic heart disease of native coronary artery without angina pectoris: Secondary | ICD-10-CM | POA: Diagnosis not present

## 2015-04-23 DIAGNOSIS — Z7901 Long term (current) use of anticoagulants: Secondary | ICD-10-CM | POA: Diagnosis not present

## 2015-04-23 DIAGNOSIS — E785 Hyperlipidemia, unspecified: Secondary | ICD-10-CM | POA: Diagnosis not present

## 2015-04-23 DIAGNOSIS — I10 Essential (primary) hypertension: Secondary | ICD-10-CM | POA: Diagnosis not present

## 2015-04-23 NOTE — Patient Outreach (Signed)
Transition of care call: Placed call to patient and spoke with wife. Wife reports that patient has recovered from pneumonia with a readmission.  I inquired if patient followed up with vascular surgeon at [facility]. Wife reports that yes; patient was seen on 04/18/2015. Reports that Dr. Vena Rua told patient that femoral bypass was not successful and that there was nothing else that could be done to restore circulation to right leg. Wife reports that MD states to keep leg elevated and keep skin intact to avoid infection. Wife reports that MD states if pain increases or if patient has a sore that will not heal that he would need an above the knee amputation. Wife reports that they will have to wait and see how long he will remain stable. Wife reports that they were saddened by the news but hopeful he will not have to have an amputation.   PLAN: Reviewed current needs with wife and at this time wife denies any current needs.Wife in agreement to case closure are this time. Reports that [facility] has been very helpful and wife was very thankful. I informed wife to call in the future if patient has needs. Also informed wife I would send out a case closure letter to MD and patient.  Will close case. Will send an inbasket message to [facility] assistants.  Rowe Pavy, RN, BSN, CEN [facility] [employer] [phone]

## 2015-04-24 DIAGNOSIS — M4716 Other spondylosis with myelopathy, lumbar region: Secondary | ICD-10-CM | POA: Diagnosis not present

## 2015-04-24 DIAGNOSIS — G629 Polyneuropathy, unspecified: Secondary | ICD-10-CM | POA: Diagnosis not present

## 2015-04-24 DIAGNOSIS — G609 Hereditary and idiopathic neuropathy, unspecified: Secondary | ICD-10-CM | POA: Diagnosis not present

## 2015-05-15 DIAGNOSIS — H43393 Other vitreous opacities, bilateral: Secondary | ICD-10-CM | POA: Diagnosis not present

## 2015-05-15 DIAGNOSIS — H5203 Hypermetropia, bilateral: Secondary | ICD-10-CM | POA: Diagnosis not present

## 2015-05-15 DIAGNOSIS — H43813 Vitreous degeneration, bilateral: Secondary | ICD-10-CM | POA: Diagnosis not present

## 2015-05-15 DIAGNOSIS — H25813 Combined forms of age-related cataract, bilateral: Secondary | ICD-10-CM | POA: Diagnosis not present

## 2015-05-15 DIAGNOSIS — H35373 Puckering of macula, bilateral: Secondary | ICD-10-CM | POA: Diagnosis not present

## 2015-05-15 DIAGNOSIS — H524 Presbyopia: Secondary | ICD-10-CM | POA: Diagnosis not present

## 2015-05-15 DIAGNOSIS — H4323 Crystalline deposits in vitreous body, bilateral: Secondary | ICD-10-CM | POA: Diagnosis not present

## 2015-05-15 DIAGNOSIS — H43313 Vitreous membranes and strands, bilateral: Secondary | ICD-10-CM | POA: Diagnosis not present

## 2015-05-15 DIAGNOSIS — I10 Essential (primary) hypertension: Secondary | ICD-10-CM | POA: Diagnosis not present

## 2015-05-15 DIAGNOSIS — H52223 Regular astigmatism, bilateral: Secondary | ICD-10-CM | POA: Diagnosis not present

## 2015-05-16 DIAGNOSIS — I739 Peripheral vascular disease, unspecified: Secondary | ICD-10-CM | POA: Diagnosis not present

## 2015-05-16 DIAGNOSIS — I82411 Acute embolism and thrombosis of right femoral vein: Secondary | ICD-10-CM | POA: Diagnosis not present

## 2015-05-16 DIAGNOSIS — L538 Other specified erythematous conditions: Secondary | ICD-10-CM | POA: Diagnosis not present

## 2015-05-16 DIAGNOSIS — Z8679 Personal history of other diseases of the circulatory system: Secondary | ICD-10-CM | POA: Diagnosis not present

## 2015-05-25 DIAGNOSIS — Z419 Encounter for procedure for purposes other than remedying health state, unspecified: Secondary | ICD-10-CM | POA: Diagnosis not present

## 2015-05-25 DIAGNOSIS — Z955 Presence of coronary angioplasty implant and graft: Secondary | ICD-10-CM | POA: Diagnosis not present

## 2015-05-25 DIAGNOSIS — I10 Essential (primary) hypertension: Secondary | ICD-10-CM | POA: Diagnosis not present

## 2015-05-25 DIAGNOSIS — F329 Major depressive disorder, single episode, unspecified: Secondary | ICD-10-CM | POA: Diagnosis not present

## 2015-05-25 DIAGNOSIS — R531 Weakness: Secondary | ICD-10-CM | POA: Diagnosis not present

## 2015-05-25 DIAGNOSIS — Z89611 Acquired absence of right leg above knee: Secondary | ICD-10-CM | POA: Diagnosis not present

## 2015-05-25 DIAGNOSIS — D649 Anemia, unspecified: Secondary | ICD-10-CM | POA: Diagnosis not present

## 2015-05-25 DIAGNOSIS — E7800 Pure hypercholesterolemia, unspecified: Secondary | ICD-10-CM | POA: Diagnosis not present

## 2015-05-25 DIAGNOSIS — M79606 Pain in leg, unspecified: Secondary | ICD-10-CM | POA: Diagnosis not present

## 2015-05-25 DIAGNOSIS — R112 Nausea with vomiting, unspecified: Secondary | ICD-10-CM | POA: Diagnosis not present

## 2015-05-25 DIAGNOSIS — F1110 Opioid abuse, uncomplicated: Secondary | ICD-10-CM | POA: Diagnosis not present

## 2015-05-25 DIAGNOSIS — Z8673 Personal history of transient ischemic attack (TIA), and cerebral infarction without residual deficits: Secondary | ICD-10-CM | POA: Diagnosis not present

## 2015-05-25 DIAGNOSIS — R269 Unspecified abnormalities of gait and mobility: Secondary | ICD-10-CM | POA: Diagnosis not present

## 2015-05-25 DIAGNOSIS — R0602 Shortness of breath: Secondary | ICD-10-CM | POA: Diagnosis not present

## 2015-05-25 DIAGNOSIS — R05 Cough: Secondary | ICD-10-CM | POA: Diagnosis not present

## 2015-05-25 DIAGNOSIS — Z4889 Encounter for other specified surgical aftercare: Secondary | ICD-10-CM | POA: Diagnosis not present

## 2015-05-25 DIAGNOSIS — Z7902 Long term (current) use of antithrombotics/antiplatelets: Secondary | ICD-10-CM | POA: Diagnosis not present

## 2015-05-25 DIAGNOSIS — G4733 Obstructive sleep apnea (adult) (pediatric): Secondary | ICD-10-CM | POA: Diagnosis not present

## 2015-05-25 DIAGNOSIS — Z7982 Long term (current) use of aspirin: Secondary | ICD-10-CM | POA: Diagnosis not present

## 2015-05-25 DIAGNOSIS — I255 Ischemic cardiomyopathy: Secondary | ICD-10-CM | POA: Diagnosis not present

## 2015-05-25 DIAGNOSIS — Z823 Family history of stroke: Secondary | ICD-10-CM | POA: Diagnosis not present

## 2015-05-25 DIAGNOSIS — Z8249 Family history of ischemic heart disease and other diseases of the circulatory system: Secondary | ICD-10-CM | POA: Diagnosis not present

## 2015-05-25 DIAGNOSIS — Z885 Allergy status to narcotic agent status: Secondary | ICD-10-CM | POA: Diagnosis not present

## 2015-05-25 DIAGNOSIS — F17210 Nicotine dependence, cigarettes, uncomplicated: Secondary | ICD-10-CM | POA: Diagnosis not present

## 2015-05-25 DIAGNOSIS — I70221 Atherosclerosis of native arteries of extremities with rest pain, right leg: Secondary | ICD-10-CM | POA: Diagnosis not present

## 2015-05-25 DIAGNOSIS — Z87891 Personal history of nicotine dependence: Secondary | ICD-10-CM | POA: Diagnosis not present

## 2015-05-25 DIAGNOSIS — Z79899 Other long term (current) drug therapy: Secondary | ICD-10-CM | POA: Diagnosis not present

## 2015-05-25 DIAGNOSIS — I509 Heart failure, unspecified: Secondary | ICD-10-CM | POA: Diagnosis not present

## 2015-05-25 DIAGNOSIS — I2510 Atherosclerotic heart disease of native coronary artery without angina pectoris: Secondary | ICD-10-CM | POA: Diagnosis not present

## 2015-05-25 DIAGNOSIS — M79605 Pain in left leg: Secondary | ICD-10-CM | POA: Diagnosis not present

## 2015-05-25 DIAGNOSIS — E785 Hyperlipidemia, unspecified: Secondary | ICD-10-CM | POA: Diagnosis not present

## 2015-05-25 DIAGNOSIS — Z01810 Encounter for preprocedural cardiovascular examination: Secondary | ICD-10-CM | POA: Diagnosis not present

## 2015-05-25 DIAGNOSIS — R4182 Altered mental status, unspecified: Secondary | ICD-10-CM | POA: Diagnosis not present

## 2015-05-25 DIAGNOSIS — I5030 Unspecified diastolic (congestive) heart failure: Secondary | ICD-10-CM | POA: Diagnosis not present

## 2015-05-25 DIAGNOSIS — I119 Hypertensive heart disease without heart failure: Secondary | ICD-10-CM | POA: Diagnosis not present

## 2015-05-25 DIAGNOSIS — G8929 Other chronic pain: Secondary | ICD-10-CM | POA: Diagnosis not present

## 2015-05-25 DIAGNOSIS — Z01818 Encounter for other preprocedural examination: Secondary | ICD-10-CM | POA: Diagnosis not present

## 2015-05-25 DIAGNOSIS — R197 Diarrhea, unspecified: Secondary | ICD-10-CM | POA: Diagnosis not present

## 2015-05-25 DIAGNOSIS — I639 Cerebral infarction, unspecified: Secondary | ICD-10-CM | POA: Diagnosis not present

## 2015-05-25 DIAGNOSIS — I70201 Unspecified atherosclerosis of native arteries of extremities, right leg: Secondary | ICD-10-CM | POA: Diagnosis not present

## 2015-05-25 DIAGNOSIS — I998 Other disorder of circulatory system: Secondary | ICD-10-CM | POA: Diagnosis not present

## 2015-05-25 DIAGNOSIS — I739 Peripheral vascular disease, unspecified: Secondary | ICD-10-CM | POA: Diagnosis not present

## 2015-05-25 DIAGNOSIS — F419 Anxiety disorder, unspecified: Secondary | ICD-10-CM | POA: Diagnosis not present

## 2015-05-25 DIAGNOSIS — J449 Chronic obstructive pulmonary disease, unspecified: Secondary | ICD-10-CM | POA: Diagnosis not present

## 2015-05-25 DIAGNOSIS — D508 Other iron deficiency anemias: Secondary | ICD-10-CM | POA: Diagnosis not present

## 2015-05-25 DIAGNOSIS — M79604 Pain in right leg: Secondary | ICD-10-CM | POA: Diagnosis not present

## 2015-05-28 ENCOUNTER — Emergency Department (HOSPITAL_COMMUNITY): Payer: PPO

## 2015-05-28 ENCOUNTER — Emergency Department (HOSPITAL_COMMUNITY)
Admission: EM | Admit: 2015-05-28 | Discharge: 2015-05-30 | Disposition: A | Discharge status: Other Acute Inpatient Hospital | Payer: PPO | Attending: Emergency Medicine | Admitting: Emergency Medicine

## 2015-05-28 ENCOUNTER — Encounter (HOSPITAL_COMMUNITY): Payer: Self-pay | Admitting: Emergency Medicine

## 2015-05-28 DIAGNOSIS — I255 Ischemic cardiomyopathy: Secondary | ICD-10-CM | POA: Insufficient documentation

## 2015-05-28 DIAGNOSIS — F1110 Opioid abuse, uncomplicated: Secondary | ICD-10-CM | POA: Insufficient documentation

## 2015-05-28 DIAGNOSIS — D649 Anemia, unspecified: Secondary | ICD-10-CM | POA: Insufficient documentation

## 2015-05-28 DIAGNOSIS — M79605 Pain in left leg: Secondary | ICD-10-CM | POA: Insufficient documentation

## 2015-05-28 DIAGNOSIS — Z87891 Personal history of nicotine dependence: Secondary | ICD-10-CM | POA: Insufficient documentation

## 2015-05-28 DIAGNOSIS — I2510 Atherosclerotic heart disease of native coronary artery without angina pectoris: Secondary | ICD-10-CM | POA: Insufficient documentation

## 2015-05-28 DIAGNOSIS — I509 Heart failure, unspecified: Secondary | ICD-10-CM | POA: Insufficient documentation

## 2015-05-28 DIAGNOSIS — Z8673 Personal history of transient ischemic attack (TIA), and cerebral infarction without residual deficits: Secondary | ICD-10-CM | POA: Insufficient documentation

## 2015-05-28 DIAGNOSIS — F329 Major depressive disorder, single episode, unspecified: Secondary | ICD-10-CM | POA: Insufficient documentation

## 2015-05-28 DIAGNOSIS — I739 Peripheral vascular disease, unspecified: Secondary | ICD-10-CM | POA: Insufficient documentation

## 2015-05-28 DIAGNOSIS — I10 Essential (primary) hypertension: Secondary | ICD-10-CM | POA: Diagnosis not present

## 2015-05-28 DIAGNOSIS — I639 Cerebral infarction, unspecified: Secondary | ICD-10-CM

## 2015-05-28 DIAGNOSIS — Z79899 Other long term (current) drug therapy: Secondary | ICD-10-CM | POA: Insufficient documentation

## 2015-05-28 DIAGNOSIS — J449 Chronic obstructive pulmonary disease, unspecified: Secondary | ICD-10-CM | POA: Diagnosis not present

## 2015-05-28 DIAGNOSIS — Z7982 Long term (current) use of aspirin: Secondary | ICD-10-CM | POA: Insufficient documentation

## 2015-05-28 DIAGNOSIS — F419 Anxiety disorder, unspecified: Secondary | ICD-10-CM | POA: Insufficient documentation

## 2015-05-28 DIAGNOSIS — R0602 Shortness of breath: Secondary | ICD-10-CM | POA: Diagnosis not present

## 2015-05-28 DIAGNOSIS — R4182 Altered mental status, unspecified: Secondary | ICD-10-CM | POA: Diagnosis not present

## 2015-05-28 DIAGNOSIS — Z7902 Long term (current) use of antithrombotics/antiplatelets: Secondary | ICD-10-CM | POA: Insufficient documentation

## 2015-05-28 DIAGNOSIS — M79604 Pain in right leg: Secondary | ICD-10-CM | POA: Diagnosis not present

## 2015-05-28 LAB — I-STAT CHEM 8, ED
BUN: 16 mg/dL (ref 6–20)
Calcium, Ion: 1.14 mmol/L (ref 1.13–1.30)
Chloride: 104 mmol/L (ref 101–111)
Creatinine, Ser: 0.9 mg/dL (ref 0.61–1.24)
Glucose, Bld: 131 mg/dL — ABNORMAL HIGH (ref 65–99)
HEMATOCRIT: 39 % (ref 39.0–52.0)
HEMOGLOBIN: 13.3 g/dL (ref 13.0–17.0)
Potassium: 3.7 mmol/L (ref 3.5–5.1)
SODIUM: 141 mmol/L (ref 135–145)
TCO2: 22 mmol/L (ref 0–100)

## 2015-05-28 LAB — COMPREHENSIVE METABOLIC PANEL
ALBUMIN: 3.8 g/dL (ref 3.5–5.0)
ALT: 19 U/L (ref 17–63)
ANION GAP: 14 (ref 5–15)
AST: 25 U/L (ref 15–41)
Alkaline Phosphatase: 57 U/L (ref 38–126)
BILIRUBIN TOTAL: 0.3 mg/dL (ref 0.3–1.2)
BUN: 14 mg/dL (ref 6–20)
CO2: 22 mmol/L (ref 22–32)
Calcium: 9.9 mg/dL (ref 8.9–10.3)
Chloride: 104 mmol/L (ref 101–111)
Creatinine, Ser: 1.07 mg/dL (ref 0.61–1.24)
GFR calc non Af Amer: >60 mL/min (ref >60)
GLUCOSE: 140 mg/dL — AB (ref 65–99)
POTASSIUM: 3.6 mmol/L (ref 3.5–5.1)
Sodium: 140 mmol/L (ref 135–145)
TOTAL PROTEIN: 6.9 g/dL (ref 6.5–8.1)

## 2015-05-28 LAB — ETHANOL: Alcohol, Ethyl (B): <5 mg/dL (ref <5)

## 2015-05-28 LAB — CBC
HCT: 37.8 % — ABNORMAL LOW (ref 39.0–52.0)
Hemoglobin: 12.4 g/dL — ABNORMAL LOW (ref 13.0–17.0)
MCH: 29 pg (ref 26.0–34.0)
MCHC: 32.8 g/dL (ref 30.0–36.0)
MCV: 88.5 fL (ref 78.0–100.0)
PLATELETS: 167 10*3/uL (ref 150–400)
RBC: 4.27 MIL/uL (ref 4.22–5.81)
RDW: 14.3 % (ref 11.5–15.5)
WBC: 6.4 10*3/uL (ref 4.0–10.5)

## 2015-05-28 LAB — DIFFERENTIAL
Basophils Absolute: 0 10*3/uL (ref 0.0–0.1)
Basophils Relative: 0 %
EOS ABS: 0.1 10*3/uL (ref 0.0–0.7)
EOS PCT: 2 %
LYMPHS ABS: 1.2 10*3/uL (ref 0.7–4.0)
Lymphocytes Relative: 18 %
Monocytes Absolute: 0.3 10*3/uL (ref 0.1–1.0)
Monocytes Relative: 5 %
NEUTROS PCT: 75 %
Neutro Abs: 4.8 10*3/uL (ref 1.7–7.7)

## 2015-05-28 LAB — APTT: aPTT: 29 seconds (ref 24–37)

## 2015-05-28 LAB — PROTIME-INR
INR: 1.08 (ref 0.00–1.49)
PROTHROMBIN TIME: 14.2 s (ref 11.6–15.2)

## 2015-05-28 LAB — I-STAT TROPONIN, ED: TROPONIN I, POC: 0.01 ng/mL (ref 0.00–0.08)

## 2015-05-28 MED ORDER — HYDROMORPHONE HCL 1 MG/ML IJ SOLN
1.0000 mg | Freq: Once | INTRAMUSCULAR | Status: AC
  Administered 2015-05-28: 1 mg via INTRAVENOUS
  Filled 2015-05-28: qty 1

## 2015-05-28 MED ORDER — FENTANYL CITRATE (PF) 100 MCG/2ML IJ SOLN
50.0000 ug | Freq: Once | INTRAMUSCULAR | Status: AC
  Administered 2015-05-28: 50 ug via INTRAVENOUS
  Filled 2015-05-28: qty 2

## 2015-05-28 MED ORDER — HYDROMORPHONE HCL 1 MG/ML IJ SOLN
2.0000 mg | Freq: Once | INTRAMUSCULAR | Status: AC
  Administered 2015-05-28: 2 mg via INTRAVENOUS
  Filled 2015-05-28: qty 2

## 2015-05-28 NOTE — Progress Notes (Signed)
Code Stroke called on 75 y.o male . LSN 2030. At home Patient developed SOB, EMS called per wife. Code Stroke paged put while Patient en route to hospital due to acute confusion, no focal deficits. Pertinent history includes PVD, COPD, CAD, Stroke, bells palsy. Per EMS his family stated that he has cognitive deficits and anxiety/depression as a result of his past strokes. CT completed STAT. NIHSS completed with Neurologist Dr. Cherylynn Ridges scored 2 for confusion and right leg drift, no other deficits noted. Glucose 131. Right leg baseline weak, for amputation this week due to severe PVD, per EMS. Code Stroke canceled per Neurologist. Pt for CTA and work up per EDP.

## 2015-05-28 NOTE — Consult Note (Signed)
NEURO HOSPITALIST CONSULT NOTE      Reason for Consult: Inability to speak   History obtained from:  Patient  And EMS  HPI:                                                                                                                                          [name] [name] is an 75 y.o. male with hx of stroke, MI and PAD who presents after having SOB. EMS was called to the house, at that time he was non focal but quickly started to breathe more shallow, appearing to be in panic and was not able to answer all questions appropriately and was taken to the ED as a code stroke. As per EMS his family stated that he has cognitive deficits and anxiety/depression as a result of his strokes. Also due to his bad PAD he is having a BKA this Friday, he takes plavix and xoraleto and stopped his plavix today.   Past Medical History  Diagnosis Date  . COPD (chronic obstructive pulmonary disease) (HCC)   . Coronary artery disease   . Stroke (HCC)   . Myocardial infarction (HCC)     X's 2  . Peripheral vascular disease (HCC)   . Bell's palsy   . Carotid artery occlusion   . Anemia   . Anxiety   . Arthritis   . Blood transfusion without reported diagnosis   . CHF (congestive heart failure) (HCC)   . Depression   . Sleep apnea   . Hyperlipidemia   . Hypertension     Past Surgical History  Procedure Laterality Date  . Eye surgery  1949  . Finger amputation  1960    Right  thumb  . Angioplasty  Dec. 2001    with stent  . Catherization  June 2002    Cardiac  . Rotator cuff repair  2007    Right  shoulder  . Colon surgery  Jan. 2009    Ischemic  . Spine surgery  Feb. 2001  . Spinal fusion  Sept. 30, 2012  .  stimulator  Aug. 6, 2012    Implantation of Spinal Stimulator  . Cholecystectomy      Gall Bladder- llaproscopic  . Carotid endarterectomy  Aug. 2002    RIGHT  cea  . Appendectomy    . Femoral bypass    . Fracture surgery      Family History   Problem Relation Age of Onset  . Hypertension Mother   . Heart disease Father   . Hyperlipidemia Father   . Hypertension Father   . Heart attack Father   . Cancer Sister     Breast  . Hypertension Sister   . Hyperlipidemia Brother   . Cancer Daughter  Breast  . Deep vein thrombosis Son   . Heart disease Son     Heart Disease before age 71  . Hyperlipidemia Son   . Hypertension Son   . Heart attack Son       Social History:  reports that he has quit smoking. His smoking use included Cigarettes. He started smoking about 14 years ago. He has never used smokeless tobacco. He reports that he does not drink alcohol or use illicit drugs.  Allergies  Allergen Reactions  . Codeine Other (See Comments)    "Makes me crazy"  . Percocet [Oxycodone-Acetaminophen] Anaphylaxis  . Promethazine Hcl Other (See Comments)    "Makes me crazy"  . Morphine And Related Rash  . Lyrica [Pregabalin]     Aggressive behavior    MEDICATIONS:                                                                                                                     I have reviewed the patient's current medications.   ROS:                                                                                                                                       History obtained from chart review and the patient  General ROS: negative for - chills, fatigue, fever, night sweats, weight gain or weight loss Psychological ROS: negative for - behavioral disorder, hallucinations, memory difficulties, mood swings or suicidal ideation Ophthalmic ROS: negative for - blurry vision, double vision, eye pain or loss of vision ENT ROS: negative for - epistaxis, nasal discharge, oral lesions, sore throat, tinnitus or vertigo Allergy and Immunology ROS: negative for - hives or itchy/watery eyes Hematological and Lymphatic ROS: negative for - bleeding problems, bruising or swollen lymph nodes Endocrine ROS: negative for -  galactorrhea, hair pattern changes, polydipsia/polyuria or temperature intolerance Respiratory ROS: negative for - cough, hemoptysis, shortness of breath or wheezing Cardiovascular ROS: negative for - chest pain, dyspnea on exertion, edema or irregular heartbeat Gastrointestinal ROS: negative for - abdominal pain, diarrhea, hematemesis, nausea/vomiting or stool incontinence Genito-Urinary ROS: negative for - dysuria, hematuria, incontinence or urinary frequency/urgency Musculoskeletal ROS: negative for - joint swelling or muscular weakness Neurological ROS: as noted in HPI Dermatological ROS: negative for rash and skin lesion changes   Blood pressure 175/53, pulse 76, temperature 98.1 F (36.7 C), temperature source Oral, resp. rate 30, SpO2 100 %.  Neurologic Examination:                                                                                                      HEENT-  Normocephalic, no lesions, without obvious abnormality.  Normal external eye and conjunctiva.  Normal TM's bilaterally.  Normal auditory canals and external ears. Normal external nose, mucus membranes and septum.  Normal pharynx. Cardiovascular- regular rate and rhythm, S1, S2 normal, no murmur, click, rub or gallop, pulses palpable throughout   Lungs- chest clear, no wheezing, rales, normal symmetric air entry, Heart exam - S1, S2 normal, no murmur, no gallop, rate regular Abdomen- soft, non-tender; bowel sounds normal; no masses,  no organomegaly Extremities- no edema Lymph-no adenopathy palpable Musculoskeletal-no joint tenderness, deformity or swelling Skin-warm and dry, no hyperpigmentation, vitiligo, or suspicious lesions  Neurological Examination Mental Status: Alert, oriented, thought content appropriate.  Speech fluent without evidence of aphasia but felt pressured.  Able to follow 3 step commands without difficulty. Cranial Nerves: II: Discs flat bilaterally; Visual fields grossly normal, pupils equal,  round, reactive to light  III,IV, VI: ptosis not present, extra-ocular motions intact bilaterally V,VII: smile symmetric, facial light touch sensation normal bilaterally VIII: hearing normal bilaterally IX,X: uvula rises symmetrically XI: bilateral shoulder shrug XII: midline tongue extension Motor: Right : Upper extremity   5/5    Left:     Upper extremity   5/5  Lower extremity   5/5     Lower extremity   5/5 Tone and bulk:normal tone throughout; no atrophy noted Sensory: Pinprick and light touch intact throughout, bilaterally Plantars: Right: downgoing   Left: downgoing Cerebellar: normal finger-to-nose, normal rapid alternating movements and normal heel-to-shin test  NIHSS 0      Lab Results: Basic Metabolic Panel:  Recent Labs Lab 05/28/15 2146 05/28/15 2221  NA 140 141  K 3.6 3.7  CL 104 104  CO2 22  --   GLUCOSE 140* 131*  BUN 14 16  CREATININE 1.07 0.90  CALCIUM 9.9  --     Liver Function Tests:  Recent Labs Lab 05/28/15 2146  AST 25  ALT 19  ALKPHOS 57  BILITOT 0.3  PROT 6.9  ALBUMIN 3.8   No results for input(s): LIPASE, AMYLASE in the last 168 hours. No results for input(s): AMMONIA in the last 168 hours.  CBC:  Recent Labs Lab 05/28/15 2146 05/28/15 2221  WBC 6.4  --   NEUTROABS 4.8  --   HGB 12.4* 13.3  HCT 37.8* 39.0  MCV 88.5  --   PLT 167  --     Cardiac Enzymes: No results for input(s): CKTOTAL, CKMB, CKMBINDEX, TROPONINI in the last 168 hours.  Lipid Panel: No results for input(s): CHOL, TRIG, HDL, CHOLHDL, VLDL, LDLCALC in the last 168 hours.  CBG: No results for input(s): GLUCAP in the last 168 hours.  Microbiology: Results for orders placed or performed during the hospital encounter of 11/23/09  Surgical pcr screen     Status: None   Collection Time: 11/23/09 10:36 AM  Result Value Ref Range Status  MRSA, PCR NEGATIVE NEGATIVE Final   Staphylococcus aureus  NEGATIVE Final    NEGATIVE        The Xpert SA Assay  (FDA approved for NASAL specimens only), is one component of a comprehensive surveillance program.  It is not intended to diagnose infection nor to guide or monitor treatment.    Coagulation Studies:  Recent Labs  05/28/15 2146  LABPROT 14.2  INR 1.08    Imaging: Ct Head Wo Contrast  05/28/2015  CLINICAL DATA:  Altered mental status.  Hallucinations. EXAM: CT HEAD WITHOUT CONTRAST TECHNIQUE: Contiguous axial images were obtained from the base of the skull through the vertex without intravenous contrast. COMPARISON:  11/02/2014 FINDINGS: There is no intracranial hemorrhage, mass or evidence of acute infarction. There is no extra-axial fluid collection. There are patchy white matter hypodensities in both cerebral hemispheres, unchanged from 11/02/2014 and likely due to small vessel disease. Probable remote lacunar infarction in the right pons, also unchanged. No acute findings are evident. Mild generalized atrophy is stable. IMPRESSION: No acute findings. There is unchanged generalized atrophy, patchy small vessel disease, and remote right pontine lacunar infarction. Critical Value/emergent results were called by telephone at the time of interpretation on 05/28/2015 at 10:06 pm to neuro Dr. Trinna Post, who verbally acknowledged these results. Electronically Signed   By: Ellery Plunk M.D.   On: 05/28/2015 22:08         Assessment/Plan:  75 y.o. male with hx of stroke, MI and PAD who presents after having SOB. EMS was called to the house, at that time he was non focal but quickly started to breathe more shallow, appearing to be in panic and was not able to answer all questions appropriately and was taken to the ED as a code stroke. As per EMS his family stated that he has cognitive deficits and anxiety/depression as a result of his strokes. Also due to his bad PAD he is having a BKA this Friday, he takes plavix and xoraleto and stopped his plavix today.   - CTH is negative - CTA head and  neck to finish stroke workup, low suspicion of occlusion -  MRI brain if possible  - SOB workup as per ED, CXR, ECHO -  HgbA1c, fasting lipid panel - PT consult, OT consult, Speech consult - Risk factor modification -Telemetry monitoring - No need for MRI due to non focal exam   Based on clinical exam and history, appears to be having a anxiety/panic attack secondary to his BKA surgery coming up this Friday but should finish stroke workup as above

## 2015-05-28 NOTE — ED Notes (Signed)
Per EMS, initially called out to pt's home for SOB. En route to facility, pt seemed to become AMS suddenly. Pt could answer some questions and follow certain commands, but not others. No identifiable pattern. No unilateral weakness, facial palsy, aphasia, LOC. Past hx includes MI x4, CVA x2, HTN, HLD, PVD, CAD. Scheduled for BKA to R leg on Friday.

## 2015-05-28 NOTE — ED Provider Notes (Addendum)
CSN: [id number]     Arrival date & time 05/28/15  2144 History   First MD Initiated Contact with Patient 05/28/15 2154     Chief Complaint  Patient presents with  . Code Stroke     (Consider location/radiation/quality/duration/timing/severity/associated sxs/prior Treatment) The history is provided by the patient and the spouse.  [name] [name] is a 75 y.o. male hx of COPD, CAD, MI, PVD s/p right extensive iliofemoral endarterectomy, profundoplasty with long patch angioplasty with bovine pericardium, Right femoral to below the knee bypass with reversed great saphenous vein here with severe right leg pain. Patient has hx of chronic leg pain from PVD. Had bypass that failed. Since yesterday, the pain became acutely worse and now he is unable to bear weight on the leg. Wife states that his right foot is always blue. He is already taking dilaudid 4mg  Q4 hrs. Wife called surgeon today and was able to move surgery to this Friday at [facility]. Patient however had much more pain and then called EMS. EMS noticed that he was tachypneic and thought that he was short of breath but patient denies shortness of breath. Patient was initially suppose to go to [place] but almost passed out because of the pain so EMS activated code stroke and brought him to [facility]. Patient awake and alert on arrival and denies slurred speech or unilateral weakness.   Level V caveat- condition of patient    Past Medical History  Diagnosis Date  . COPD (chronic obstructive pulmonary disease) (HCC)   . Coronary artery disease   . Stroke (HCC)   . Myocardial infarction (HCC)     X's 2  . Peripheral vascular disease (HCC)   . Bell's palsy   . Carotid artery occlusion   . Anemia   . Anxiety   . Arthritis   . Blood transfusion without reported diagnosis   . CHF (congestive heart failure) (HCC)   . Depression   . Sleep apnea   . Hyperlipidemia   . Hypertension    Past Surgical History  Procedure Laterality Date  . Eye  surgery  1949  . Finger amputation  1960    Right  thumb  . Angioplasty  Dec. 2001    with stent  . Catherization  June 2002    Cardiac  . Rotator cuff repair  2007    Right  shoulder  . Colon surgery  Jan. 2009    Ischemic  . Spine surgery  Feb. 2001  . Spinal fusion  Sept. 30, 2012  .  stimulator  Aug. 6, 2012    Implantation of Spinal Stimulator  . Cholecystectomy      Gall Bladder- llaproscopic  . Carotid endarterectomy  Aug. 2002    RIGHT  cea  . Appendectomy    . Femoral bypass    . Fracture surgery     Family History  Problem Relation Age of Onset  . Hypertension Mother   . Heart disease Father   . Hyperlipidemia Father   . Hypertension Father   . Heart attack Father   . Cancer Sister     Breast  . Hypertension Sister   . Hyperlipidemia Brother   . Cancer Daughter     Breast  . Deep vein thrombosis Son   . Heart disease Son     Heart Disease before age 1  . Hyperlipidemia Son   . Hypertension Son   . Heart attack Son    Social History  Substance Use Topics  .  Smoking status: Former Smoker    Types: Cigarettes    Start date: 02/24/2001  . Smokeless tobacco: Never Used  . Alcohol Use: No    Review of Systems  Musculoskeletal:       R leg pain   All other systems reviewed and are negative.     Allergies  Codeine; Percocet; Promethazine hcl; Morphine and related; and Lyrica  Home Medications   Prior to Admission medications   Medication Sig Start Date End Date Taking? Authorizing Provider  acetaminophen (TYLENOL) 325 MG tablet Take 650 mg by mouth every 4 (four) hours as needed.    Historical Provider, MD  amLODipine (NORVASC) 2.5 MG tablet Take 2.5 mg by mouth daily.    Historical Provider, MD  aspirin 325 MG tablet Take 325 mg by mouth daily. Reported on 02/21/2015    Historical Provider, MD  cholecalciferol (VITAMIN D) 1000 UNITS tablet Take 1,000 Units by mouth 2 (two) times daily. Reported on 02/21/2015    Historical Provider, MD   cholecalciferol (VITAMIN D) 400 units TABS tablet Take 1,000 tablets by mouth 1 day or 1 dose.    Historical Provider, MD  citalopram (CELEXA) 20 MG tablet daily. 01/06/12   Historical Provider, MD  cloNIDine (CATAPRES) 0.1 MG tablet Take 0.1 mg by mouth every 8 (eight) hours. Reported on 03/20/2015    Historical Provider, MD  clopidogrel (PLAVIX) 75 MG tablet Take 75 mg by mouth daily.    Historical Provider, MD  Cyanocobalamin (VITAMIN B 12 PO) Take 1,000 mg by mouth 2 (two) times daily. Reported on 02/21/2015    Historical Provider, MD  escitalopram (LEXAPRO) 10 MG tablet Take 10 mg by mouth daily. Reported on 02/21/2015    Historical Provider, MD  ferrous sulfate 325 (65 FE) MG tablet Take 325 mg by mouth 2 (two) times daily after a meal.     Historical Provider, MD  folic acid (FOLVITE) 1 MG tablet Take 1 mg by mouth daily.    Historical Provider, MD  furosemide (LASIX) 40 MG tablet Take 40 mg by mouth.    Historical Provider, MD  HYDROcodone-acetaminophen (NORCO/VICODIN) 5-325 MG tablet Take 1 tablet by mouth every 4 (four) hours as needed for moderate pain.    Historical Provider, MD  HYDROmorphone (DILAUDID) 4 MG tablet Take 4 mg by mouth every 4 (four) hours as needed for severe pain.     Historical Provider, MD  metoprolol succinate (TOPROL-XL) 100 MG 24 hr tablet Take 100 mg by mouth daily. Reported on 03/20/2015    Alger Memos, NP  Multiple Vitamin (MULTIVITAMIN) tablet Take 1 tablet by mouth daily.    Historical Provider, MD  Omega-3 Fatty Acids (FISH OIL) 1200 MG CAPS Take 1,200 mg by mouth 2 (two) times daily.    Historical Provider, MD  Omega-3 Fatty Acids (FISH OIL) 1200 MG CPDR Take 2 capsules by mouth.    Historical Provider, MD  ondansetron (ZOFRAN-ODT) 4 MG disintegrating tablet Take 4 mg by mouth every 4 (four) hours. As needed only    Historical Provider, MD  ramipril (ALTACE) 10 MG capsule daily. Reported on 02/21/2015 02/16/12   Historical Provider, MD  rivaroxaban  (XARELTO) 20 MG TABS tablet Take 20 mg by mouth daily with supper.    Penelope Zacarias Pontes, NP  rosuvastatin (CRESTOR) 20 MG tablet Take 20 mg by mouth daily.    Historical Provider, MD  sennosides-docusate sodium (SENOKOT-S) 8.6-50 MG tablet Take 2 tablets by mouth daily.    Historical Provider,  MD  spironolactone-hydrochlorothiazide (ALDACTAZIDE) 25-25 MG per tablet daily. Reported on 02/21/2015 01/06/12   Historical Provider, MD  sulfamethoxazole-trimethoprim (BACTRIM DS,SEPTRA DS) 800-160 MG tablet Take 1 tablet by mouth 2 (two) times daily. Reported on 03/20/2015    Historical Provider, MD  tamsulosin (FLOMAX) 0.4 MG CAPS capsule Take 0.4 mg by mouth. Reported on 02/21/2015    Alger Memos, NP  vitamin B-12 (CYANOCOBALAMIN) 1000 MCG tablet Take 1,000 mcg by mouth daily.    Historical Provider, MD   BP 175/53 mmHg  Pulse 76  Temp(Src) 98.1 F (36.7 C) (Oral)  Resp 30  SpO2 100% Physical Exam  Constitutional: He is oriented to person, place, and time.  Uncomfortable, screaming in pain   HENT:  Head: Normocephalic.  Eyes: Conjunctivae are normal. Pupils are equal, round, and reactive to light.  Neck: Normal range of motion. Neck supple.  Cardiovascular: Normal rate, regular rhythm and normal heart sounds.   Pulmonary/Chest: Effort normal and breath sounds normal. No respiratory distress. He has no wheezes. He has no rales.  tachypneic   Abdominal: Soft. Bowel sounds are normal. He exhibits no distension. There is no tenderness. There is no rebound.  Musculoskeletal:  R foot slightly cold, dec capillary refill on toes. Foot slightly blue. Unable to feel a pulse but able to doppler a faint posterior tibial pulse. Able to wiggle toes   Neurological: He is alert and oriented to person, place, and time.  Skin: Skin is warm and dry.  Psychiatric: He has a normal mood and affect. His behavior is normal. Judgment and thought content normal.  Nursing note and vitals reviewed.   ED Course   Procedures (including critical care time) Labs Review Labs Reviewed  CBC - Abnormal; Notable for the following:    Hemoglobin 12.4 (*)    HCT 37.8 (*)    All other components within normal limits  COMPREHENSIVE METABOLIC PANEL - Abnormal; Notable for the following:    Glucose, Bld 140 (*)    All other components within normal limits  I-STAT CHEM 8, ED - Abnormal; Notable for the following:    Glucose, Bld 131 (*)    All other components within normal limits  ETHANOL  PROTIME-INR  APTT  DIFFERENTIAL  URINE RAPID DRUG SCREEN, HOSP PERFORMED  URINALYSIS, ROUTINE W REFLEX MICROSCOPIC (NOT AT [facility])  Rosezena Sensor, ED    Imaging Review Ct Head Wo Contrast  05/28/2015  CLINICAL DATA:  Altered mental status.  Hallucinations. EXAM: CT HEAD WITHOUT CONTRAST TECHNIQUE: Contiguous axial images were obtained from the base of the skull through the vertex without intravenous contrast. COMPARISON:  11/02/2014 FINDINGS: There is no intracranial hemorrhage, mass or evidence of acute infarction. There is no extra-axial fluid collection. There are patchy white matter hypodensities in both cerebral hemispheres, unchanged from 11/02/2014 and likely due to small vessel disease. Probable remote lacunar infarction in the right pons, also unchanged. No acute findings are evident. Mild generalized atrophy is stable. IMPRESSION: No acute findings. There is unchanged generalized atrophy, patchy small vessel disease, and remote right pontine lacunar infarction. Critical Value/emergent results were called by telephone at the time of interpretation on 05/28/2015 at 10:06 pm to neuro Dr. Trinna Post, who verbally acknowledged these results. Electronically Signed   By: Ellery Plunk M.D.   On: 05/28/2015 22:08   Dg Chest Port 1 View  05/28/2015  CLINICAL DATA:  Altered mental status.  Dyspnea. EXAM: PORTABLE CHEST 1 VIEW COMPARISON:  11/02/2014 FINDINGS: A single AP portable view of  the chest demonstrates no focal airspace  consolidation or alveolar edema. The lungs are grossly clear. There is no large effusion or pneumothorax. Cardiac and mediastinal contours appear unremarkable and unchanged. IMPRESSION: No active disease. Electronically Signed   By: Ellery Plunk M.D.   On: 05/28/2015 23:02   I have personally reviewed and evaluated these images and lab results as part of my medical decision-making.   EKG Interpretation None      MDM   Final diagnoses:  None   [name] [name] is a 75 y.o. male here with R leg pain. Code stroke activated by EMS but canceled by neurology. EMS reported shortness of breath but he is just breathing fast from having a lot of leg pain. Has PVD and scheduled for amputation from Friday so Plavix stopped today. He still has faint dopplerable pulse R posterior tibial. Will try IV pain meds and reassess.   12:01 AM Patient still in severe pain despite multiple rounds of IV pain meds. I consulted Vascular surgery at [facility], Dr. Earlene Plater, who doesn't want to start heparin. Has complicated vascular anatomy and will admit to medsurg at [facility] for pain control. Will likely be able to move up AKA. Will keep NPO for now. Neurologist doesn't think patient has a stroke but does recommend some workup including MRI brain and echo. I feel that patient likely was in pain causing AMS during EMS transfer. Patient admitted to [facility] and can get workup there if he develops focal neurologic deficits or changes in mental status again.     Richardean Canal, MD 05/29/15 1610  Richardean Canal, MD 05/29/15 [id number]

## 2015-05-29 DIAGNOSIS — R4182 Altered mental status, unspecified: Secondary | ICD-10-CM | POA: Diagnosis not present

## 2015-05-29 LAB — RAPID URINE DRUG SCREEN, HOSP PERFORMED
AMPHETAMINES: NOT DETECTED
BARBITURATES: NOT DETECTED
Benzodiazepines: NOT DETECTED
COCAINE: NOT DETECTED
OPIATES: POSITIVE — AB
TETRAHYDROCANNABINOL: NOT DETECTED

## 2015-05-29 LAB — URINALYSIS, ROUTINE W REFLEX MICROSCOPIC
BILIRUBIN URINE: NEGATIVE
Glucose, UA: NEGATIVE mg/dL
Hgb urine dipstick: NEGATIVE
KETONES UR: NEGATIVE mg/dL
LEUKOCYTES UA: NEGATIVE
NITRITE: NEGATIVE
PH: 8.5 — AB (ref 5.0–8.0)
PROTEIN: NEGATIVE mg/dL
Specific Gravity, Urine: 1.009 (ref 1.005–1.030)

## 2015-05-29 MED ORDER — FUROSEMIDE 20 MG PO TABS
40.0000 mg | ORAL_TABLET | Freq: Every day | ORAL | Status: DC
  Administered 2015-05-29: 40 mg via ORAL
  Filled 2015-05-29 (×2): qty 2

## 2015-05-29 MED ORDER — ACETAMINOPHEN 325 MG PO TABS: 650.0000 mg | ORAL_TABLET | ORAL | Status: DC | PRN

## 2015-05-29 MED ORDER — HYDROMORPHONE HCL 1 MG/ML IJ SOLN
1.0000 mg | INTRAMUSCULAR | Status: DC | PRN
  Administered 2015-05-29 – 2015-05-30 (×10): 1 mg via INTRAVENOUS
  Filled 2015-05-29 (×11): qty 1

## 2015-05-29 MED ORDER — ROSUVASTATIN CALCIUM 20 MG PO TABS
20.0000 mg | ORAL_TABLET | Freq: Every day | ORAL | Status: DC
  Administered 2015-05-29: 20 mg via ORAL
  Filled 2015-05-29: qty 1

## 2015-05-29 MED ORDER — SODIUM CHLORIDE 0.9 % IV SOLN
Freq: Once | INTRAVENOUS | Status: AC
  Administered 2015-05-29: 01:00:00 via INTRAVENOUS

## 2015-05-29 MED ORDER — CLOPIDOGREL BISULFATE 75 MG PO TABS: 75.0000 mg | ORAL_TABLET | Freq: Every day | ORAL | Status: DC

## 2015-05-29 MED ORDER — CITALOPRAM HYDROBROMIDE 10 MG PO TABS
20.0000 mg | ORAL_TABLET | Freq: Every day | ORAL | Status: DC
  Administered 2015-05-29: 20 mg via ORAL
  Filled 2015-05-29 (×2): qty 2

## 2015-05-29 MED ORDER — RIVAROXABAN 20 MG PO TABS
20.0000 mg | ORAL_TABLET | Freq: Every day | ORAL | Status: DC
  Administered 2015-05-29: 20 mg via ORAL
  Filled 2015-05-29: qty 1

## 2015-05-29 MED ORDER — SPIRONOLACTONE-HCTZ 25-25 MG PO TABS
1.0000 | ORAL_TABLET | Freq: Every day | ORAL | Status: DC
  Administered 2015-05-29: 1 via ORAL
  Filled 2015-05-29 (×2): qty 1

## 2015-05-29 MED ORDER — ESCITALOPRAM OXALATE 10 MG PO TABS
10.0000 mg | ORAL_TABLET | Freq: Every day | ORAL | Status: DC
  Administered 2015-05-29: 10 mg via ORAL
  Filled 2015-05-29 (×2): qty 1

## 2015-05-29 MED ORDER — ASPIRIN 325 MG PO TABS
325.0000 mg | ORAL_TABLET | Freq: Every day | ORAL | Status: DC
  Administered 2015-05-29: 325 mg via ORAL
  Filled 2015-05-29 (×2): qty 1

## 2015-05-29 MED ORDER — AMLODIPINE BESYLATE 5 MG PO TABS: 2.5000 mg | ORAL_TABLET | Freq: Every day | ORAL | Status: DC

## 2015-05-29 MED ORDER — FOLIC ACID 1 MG PO TABS
1.0000 mg | ORAL_TABLET | Freq: Every day | ORAL | Status: DC
  Administered 2015-05-29: 1 mg via ORAL
  Filled 2015-05-29 (×2): qty 1

## 2015-05-29 MED ORDER — METOPROLOL SUCCINATE ER 25 MG PO TB24
100.0000 mg | ORAL_TABLET | Freq: Every day | ORAL | Status: DC
  Administered 2015-05-29: 100 mg via ORAL
  Filled 2015-05-29 (×2): qty 4

## 2015-05-29 MED ORDER — FERROUS SULFATE 325 (65 FE) MG PO TABS
325.0000 mg | ORAL_TABLET | Freq: Two times a day (BID) | ORAL | Status: DC
  Administered 2015-05-29: 325 mg via ORAL
  Filled 2015-05-29 (×2): qty 1

## 2015-05-29 MED ORDER — CLONIDINE HCL 0.1 MG PO TABS
0.1000 mg | ORAL_TABLET | Freq: Three times a day (TID) | ORAL | Status: DC
  Administered 2015-05-29 – 2015-05-30 (×3): 0.1 mg via ORAL
  Filled 2015-05-29 (×3): qty 1

## 2015-05-29 NOTE — ED Notes (Signed)
NS called [facility] and talked to pt placement, still waiting for bed to become available.

## 2015-05-29 NOTE — ED Notes (Signed)
Patient is NPO at this time. 

## 2015-05-29 NOTE — ED Notes (Signed)
Per MD Plunkett who spoke to [facility] of pt and need of bed, no beds available at this time.  MD to place order for diet and home medications.

## 2015-05-29 NOTE — Progress Notes (Signed)
Interval History:                                                                                                                      [name] [name] is an 75 y.o. male patient presented to the ER with altered mental status as described in the neurology consultation note. He is at his baseline now, normal mental status now. Not in distress. Denies any new neurological symptoms. Continues to report pain in his right lower extremity.  Past Medical History: Past Medical History  Diagnosis Date  . COPD (chronic obstructive pulmonary disease) (HCC)   . Coronary artery disease   . Stroke (HCC)   . Myocardial infarction (HCC)     X's 2  . Peripheral vascular disease (HCC)   . Bell's palsy   . Carotid artery occlusion   . Anemia   . Anxiety   . Arthritis   . Blood transfusion without reported diagnosis   . CHF (congestive heart failure) (HCC)   . Depression   . Sleep apnea   . Hyperlipidemia   . Hypertension     Past Surgical History  Procedure Laterality Date  . Eye surgery  1949  . Finger amputation  1960    Right  thumb  . Angioplasty  Dec. 2001    with stent  . Catherization  June 2002    Cardiac  . Rotator cuff repair  2007    Right  shoulder  . Colon surgery  Jan. 2009    Ischemic  . Spine surgery  Feb. 2001  . Spinal fusion  Sept. 30, 2012  .  stimulator  Aug. 6, 2012    Implantation of Spinal Stimulator  . Cholecystectomy      Gall Bladder- llaproscopic  . Carotid endarterectomy  Aug. 2002    RIGHT  cea  . Appendectomy    . Femoral bypass    . Fracture surgery      Family History: Family History  Problem Relation Age of Onset  . Hypertension Mother   . Heart disease Father   . Hyperlipidemia Father   . Hypertension Father   . Heart attack Father   . Cancer Sister     Breast  . Hypertension Sister   . Hyperlipidemia Brother   . Cancer Daughter     Breast  . Deep vein thrombosis Son   . Heart disease Son     Heart Disease before age 58  .  Hyperlipidemia Son   . Hypertension Son   . Heart attack Son     Social History:   reports that he has quit smoking. His smoking use included Cigarettes. He started smoking about 14 years ago. He has never used smokeless tobacco. He reports that he does not drink alcohol or use illicit drugs.  Allergies:  Allergies  Allergen Reactions  . Codeine Other (See Comments)    "Makes me crazy"  . Percocet [Oxycodone-Acetaminophen] Anaphylaxis  . Promethazine Hcl Other (See Comments)    "  Makes me crazy"  . Morphine And Related Rash  . Lyrica [Pregabalin]     Aggressive behavior     Medications:                                                                                                                         Current facility-administered medications:  .  acetaminophen (TYLENOL) tablet 650 mg, 650 mg, Oral, Q4H PRN, Gwyneth Sprout, MD .  amLODipine (NORVASC) tablet 2.5 mg, 2.5 mg, Oral, Daily, Gwyneth Sprout, MD .  aspirin tablet 325 mg, 325 mg, Oral, Daily, Gwyneth Sprout, MD .  citalopram (CELEXA) tablet 20 mg, 20 mg, Oral, Daily, Gwyneth Sprout, MD .  cloNIDine (CATAPRES) tablet 0.1 mg, 0.1 mg, Oral, 3 times per day, Gwyneth Sprout, MD .  clopidogrel (PLAVIX) tablet 75 mg, 75 mg, Oral, Daily, Gwyneth Sprout, MD, 75 mg at 05/29/15 1812 .  escitalopram (LEXAPRO) tablet 10 mg, 10 mg, Oral, Daily, Gwyneth Sprout, MD .  ferrous sulfate tablet 325 mg, 325 mg, Oral, BID PC, Gwyneth Sprout, MD .  folic acid (FOLVITE) tablet 1 mg, 1 mg, Oral, Daily, Gwyneth Sprout, MD .  furosemide (LASIX) tablet 40 mg, 40 mg, Oral, Daily, Gwyneth Sprout, MD .  HYDROmorphone (DILAUDID) injection 1 mg, 1 mg, Intravenous, Q1H PRN, Richardean Canal, MD, 1 mg at 05/29/15 1740 .  metoprolol succinate (TOPROL-XL) 24 hr tablet 100 mg, 100 mg, Oral, Daily, Gwyneth Sprout, MD .  rivaroxaban (XARELTO) tablet 20 mg, 20 mg, Oral, Q supper, Gwyneth Sprout, MD .  rosuvastatin (CRESTOR) tablet 20 mg, 20  mg, Oral, q1800, Gwyneth Sprout, MD .  spironolactone-hydrochlorothiazide (ALDACTAZIDE) 25-25 MG per tablet 1 tablet, 1 tablet, Oral, Daily, Gwyneth Sprout, MD  Current outpatient prescriptions:  .  acetaminophen (TYLENOL) 325 MG tablet, Take 650 mg by mouth every 4 (four) hours as needed., Disp: , Rfl:  .  amLODipine (NORVASC) 2.5 MG tablet, Take 2.5 mg by mouth daily., Disp: , Rfl:  .  aspirin 325 MG tablet, Take 325 mg by mouth daily. Reported on 02/21/2015, Disp: , Rfl:  .  cholecalciferol (VITAMIN D) 1000 UNITS tablet, Take 1,000 Units by mouth 2 (two) times daily. Reported on 02/21/2015, Disp: , Rfl:  .  cholecalciferol (VITAMIN D) 400 units TABS tablet, Take 1,000 tablets by mouth 1 day or 1 dose., Disp: , Rfl:  .  citalopram (CELEXA) 20 MG tablet, daily., Disp: , Rfl:  .  cloNIDine (CATAPRES) 0.1 MG tablet, Take 0.1 mg by mouth every 8 (eight) hours. Reported on 03/20/2015, Disp: , Rfl:  .  clopidogrel (PLAVIX) 75 MG tablet, Take 75 mg by mouth daily., Disp: , Rfl:  .  Cyanocobalamin (VITAMIN B 12 PO), Take 1,000 mg by mouth 2 (two) times daily. Reported on 02/21/2015, Disp: , Rfl:  .  escitalopram (LEXAPRO) 10 MG tablet, Take 10 mg by mouth daily. Reported on 02/21/2015, Disp: , Rfl:  .  ferrous sulfate 325 (65 FE) MG tablet, Take 325 mg  by mouth 2 (two) times daily after a meal. , Disp: , Rfl:  .  folic acid (FOLVITE) 1 MG tablet, Take 1 mg by mouth daily., Disp: , Rfl:  .  furosemide (LASIX) 40 MG tablet, Take 40 mg by mouth., Disp: , Rfl:  .  HYDROcodone-acetaminophen (NORCO/VICODIN) 5-325 MG tablet, Take 1 tablet by mouth every 4 (four) hours as needed for moderate pain., Disp: , Rfl:  .  HYDROmorphone (DILAUDID) 4 MG tablet, Take 4 mg by mouth every 4 (four) hours as needed for severe pain. , Disp: , Rfl:  .  metoprolol succinate (TOPROL-XL) 100 MG 24 hr tablet, Take 100 mg by mouth daily. Reported on 03/20/2015, Disp: , Rfl:  .  Multiple Vitamin (MULTIVITAMIN) tablet, Take 1  tablet by mouth daily., Disp: , Rfl:  .  Omega-3 Fatty Acids (FISH OIL) 1200 MG CAPS, Take 1,200 mg by mouth 2 (two) times daily., Disp: , Rfl:  .  Omega-3 Fatty Acids (FISH OIL) 1200 MG CPDR, Take 2 capsules by mouth., Disp: , Rfl:  .  ondansetron (ZOFRAN-ODT) 4 MG disintegrating tablet, Take 4 mg by mouth every 4 (four) hours. As needed only, Disp: , Rfl:  .  ramipril (ALTACE) 10 MG capsule, daily. Reported on 02/21/2015, Disp: , Rfl:  .  rivaroxaban (XARELTO) 20 MG TABS tablet, Take 20 mg by mouth daily with supper., Disp: , Rfl:  .  rosuvastatin (CRESTOR) 20 MG tablet, Take 20 mg by mouth daily., Disp: , Rfl:  .  sennosides-docusate sodium (SENOKOT-S) 8.6-50 MG tablet, Take 2 tablets by mouth daily., Disp: , Rfl:  .  spironolactone-hydrochlorothiazide (ALDACTAZIDE) 25-25 MG per tablet, daily. Reported on 02/21/2015, Disp: , Rfl:  .  sulfamethoxazole-trimethoprim (BACTRIM DS,SEPTRA DS) 800-160 MG tablet, Take 1 tablet by mouth 2 (two) times daily. Reported on 03/20/2015, Disp: , Rfl:  .  tamsulosin (FLOMAX) 0.4 MG CAPS capsule, Take 0.4 mg by mouth. Reported on 02/21/2015, Disp: , Rfl:  .  vitamin B-12 (CYANOCOBALAMIN) 1000 MCG tablet, Take 1,000 mcg by mouth daily., Disp: , Rfl:    Neurologic Examination:                                                                                                     Today's Vitals   05/29/15 1400 05/29/15 1417 05/29/15 1600 05/29/15 1741  BP: 122/41     Pulse: 69     Temp:      TempSrc:      Resp: 23     SpO2: 92%     PainSc:  6  4  6      Evaluation of higher integrative functions including: Level of alertness: Alert,  Oriented to time, place and person Speech: fluent, no evidence of dysarthria or aphasia noted.  Test the following cranial nerves: 2-12 grossly intact Motor examination: Normal tone, bulk, full 5/5 motor strength in all 4 extremities Test coordination: Normal finger nose testing, with no evidence of limb appendicular ataxia or  abnormal involuntary movements or tremors noted.     Lab Results: Basic Metabolic Panel:  Recent Labs Lab  05/28/15 2146 05/28/15 2221  NA 140 141  K 3.6 3.7  CL 104 104  CO2 22  --   GLUCOSE 140* 131*  BUN 14 16  CREATININE 1.07 0.90  CALCIUM 9.9  --     Liver Function Tests:  Recent Labs Lab 05/28/15 2146  AST 25  ALT 19  ALKPHOS 57  BILITOT 0.3  PROT 6.9  ALBUMIN 3.8   No results for input(s): LIPASE, AMYLASE in the last 168 hours. No results for input(s): AMMONIA in the last 168 hours.  CBC:  Recent Labs Lab 05/28/15 2146 05/28/15 2221  WBC 6.4  --   NEUTROABS 4.8  --   HGB 12.4* 13.3  HCT 37.8* 39.0  MCV 88.5  --   PLT 167  --     Cardiac Enzymes: No results for input(s): CKTOTAL, CKMB, CKMBINDEX, TROPONINI in the last 168 hours.  Lipid Panel: No results for input(s): CHOL, TRIG, HDL, CHOLHDL, VLDL, LDLCALC in the last 168 hours.  CBG: No results for input(s): GLUCAP in the last 168 hours.  Microbiology: Results for orders placed or performed during the hospital encounter of 11/23/09  Surgical pcr screen     Status: None   Collection Time: 11/23/09 10:36 AM  Result Value Ref Range Status   MRSA, PCR NEGATIVE NEGATIVE Final   Staphylococcus aureus  NEGATIVE Final    NEGATIVE        The Xpert SA Assay (FDA approved for NASAL specimens only), is one component of a comprehensive surveillance program.  It is not intended to diagnose infection nor to guide or monitor treatment.    Imaging: Ct Head Wo Contrast  05/28/2015  CLINICAL DATA:  Altered mental status.  Hallucinations. EXAM: CT HEAD WITHOUT CONTRAST TECHNIQUE: Contiguous axial images were obtained from the base of the skull through the vertex without intravenous contrast. COMPARISON:  11/02/2014 FINDINGS: There is no intracranial hemorrhage, mass or evidence of acute infarction. There is no extra-axial fluid collection. There are patchy white matter hypodensities in both cerebral  hemispheres, unchanged from 11/02/2014 and likely due to small vessel disease. Probable remote lacunar infarction in the right pons, also unchanged. No acute findings are evident. Mild generalized atrophy is stable. IMPRESSION: No acute findings. There is unchanged generalized atrophy, patchy small vessel disease, and remote right pontine lacunar infarction. Critical Value/emergent results were called by telephone at the time of interpretation on 05/28/2015 at 10:06 pm to neuro Dr. Trinna Post, who verbally acknowledged these results. Electronically Signed   By: Ellery Plunk M.D.   On: 05/28/2015 22:08   Dg Chest Port 1 View  05/28/2015  CLINICAL DATA:  Altered mental status.  Dyspnea. EXAM: PORTABLE CHEST 1 VIEW COMPARISON:  11/02/2014 FINDINGS: A single AP portable view of the chest demonstrates no focal airspace consolidation or alveolar edema. The lungs are grossly clear. There is no large effusion or pneumothorax. Cardiac and mediastinal contours appear unremarkable and unchanged. IMPRESSION: No active disease. Electronically Signed   By: Ellery Plunk M.D.   On: 05/28/2015 23:02    Assessment and plan:   [name] [name] is an 75 y.o. male patient with significant anxiety issues, presented with some altered mental status symptoms yesterday as described in the neurology consultation note. At this time his mental status is normal, no motor weakness noted, grossly nonfocal neurological exam. No further neurodiagnostic testing recommended at this time. We'll sign off. please call for any further questions.

## 2015-05-29 NOTE — ED Notes (Signed)
Pt's family at bedside. Pt eating dinner.

## 2015-05-30 DIAGNOSIS — E7800 Pure hypercholesterolemia, unspecified: Secondary | ICD-10-CM | POA: Diagnosis not present

## 2015-05-30 DIAGNOSIS — G8929 Other chronic pain: Secondary | ICD-10-CM | POA: Diagnosis not present

## 2015-05-30 DIAGNOSIS — I119 Hypertensive heart disease without heart failure: Secondary | ICD-10-CM | POA: Diagnosis not present

## 2015-05-30 DIAGNOSIS — Z955 Presence of coronary angioplasty implant and graft: Secondary | ICD-10-CM | POA: Diagnosis not present

## 2015-05-30 DIAGNOSIS — Z8673 Personal history of transient ischemic attack (TIA), and cerebral infarction without residual deficits: Secondary | ICD-10-CM | POA: Diagnosis not present

## 2015-05-30 DIAGNOSIS — Z79899 Other long term (current) drug therapy: Secondary | ICD-10-CM | POA: Diagnosis not present

## 2015-05-30 DIAGNOSIS — G4733 Obstructive sleep apnea (adult) (pediatric): Secondary | ICD-10-CM | POA: Diagnosis not present

## 2015-05-30 DIAGNOSIS — J449 Chronic obstructive pulmonary disease, unspecified: Secondary | ICD-10-CM | POA: Diagnosis not present

## 2015-05-30 DIAGNOSIS — Z8249 Family history of ischemic heart disease and other diseases of the circulatory system: Secondary | ICD-10-CM | POA: Diagnosis not present

## 2015-05-30 DIAGNOSIS — I2510 Atherosclerotic heart disease of native coronary artery without angina pectoris: Secondary | ICD-10-CM | POA: Diagnosis not present

## 2015-05-30 DIAGNOSIS — R197 Diarrhea, unspecified: Secondary | ICD-10-CM | POA: Diagnosis not present

## 2015-05-30 DIAGNOSIS — I5030 Unspecified diastolic (congestive) heart failure: Secondary | ICD-10-CM | POA: Diagnosis not present

## 2015-05-30 DIAGNOSIS — Z87891 Personal history of nicotine dependence: Secondary | ICD-10-CM | POA: Diagnosis not present

## 2015-05-30 DIAGNOSIS — E785 Hyperlipidemia, unspecified: Secondary | ICD-10-CM | POA: Diagnosis not present

## 2015-05-30 DIAGNOSIS — I998 Other disorder of circulatory system: Secondary | ICD-10-CM | POA: Diagnosis not present

## 2015-05-30 DIAGNOSIS — Z885 Allergy status to narcotic agent status: Secondary | ICD-10-CM | POA: Diagnosis not present

## 2015-05-30 DIAGNOSIS — Z823 Family history of stroke: Secondary | ICD-10-CM | POA: Diagnosis not present

## 2015-05-30 DIAGNOSIS — Z7902 Long term (current) use of antithrombotics/antiplatelets: Secondary | ICD-10-CM | POA: Diagnosis not present

## 2015-05-30 DIAGNOSIS — R269 Unspecified abnormalities of gait and mobility: Secondary | ICD-10-CM | POA: Diagnosis not present

## 2015-05-30 DIAGNOSIS — I739 Peripheral vascular disease, unspecified: Secondary | ICD-10-CM | POA: Diagnosis not present

## 2015-05-30 MED ORDER — HYDROCHLOROTHIAZIDE 25 MG PO TABS
25.0000 mg | ORAL_TABLET | Freq: Every day | ORAL | Status: DC
  Filled 2015-05-30: qty 1

## 2015-05-30 MED ORDER — SPIRONOLACTONE 25 MG PO TABS
25.0000 mg | ORAL_TABLET | Freq: Every day | ORAL | Status: DC
  Filled 2015-05-30: qty 1

## 2015-05-30 NOTE — ED Notes (Signed)
Spoke with admission at [facility] and they stated that [name] [name] is working on a bed for the patient and will call me back.

## 2015-05-30 NOTE — ED Notes (Signed)
Pt. Transported by [facility] to [facility] at this time.

## 2015-05-30 NOTE — ED Notes (Signed)
Wife informed RN that she spoke with [facility] and the surgeon is supposed to be moving the patient's surgery up to today. Breakfast/meds held at this time. Secretary contacting [facility] at this time.

## 2015-05-31 DIAGNOSIS — I70201 Unspecified atherosclerosis of native arteries of extremities, right leg: Secondary | ICD-10-CM | POA: Diagnosis not present

## 2015-05-31 DIAGNOSIS — I739 Peripheral vascular disease, unspecified: Secondary | ICD-10-CM | POA: Diagnosis not present

## 2015-05-31 DIAGNOSIS — I70221 Atherosclerosis of native arteries of extremities with rest pain, right leg: Secondary | ICD-10-CM | POA: Diagnosis not present

## 2015-06-03 DIAGNOSIS — R112 Nausea with vomiting, unspecified: Secondary | ICD-10-CM | POA: Diagnosis not present

## 2015-06-04 DIAGNOSIS — I739 Peripheral vascular disease, unspecified: Secondary | ICD-10-CM | POA: Diagnosis not present

## 2015-06-05 DIAGNOSIS — I779 Disorder of arteries and arterioles, unspecified: Secondary | ICD-10-CM | POA: Diagnosis not present

## 2015-06-05 DIAGNOSIS — G8929 Other chronic pain: Secondary | ICD-10-CM | POA: Diagnosis not present

## 2015-06-05 DIAGNOSIS — Z4889 Encounter for other specified surgical aftercare: Secondary | ICD-10-CM | POA: Diagnosis not present

## 2015-06-05 DIAGNOSIS — Z8961 Acquired absence of leg above knee: Secondary | ICD-10-CM | POA: Diagnosis not present

## 2015-06-05 DIAGNOSIS — G8918 Other acute postprocedural pain: Secondary | ICD-10-CM | POA: Diagnosis not present

## 2015-06-05 DIAGNOSIS — I998 Other disorder of circulatory system: Secondary | ICD-10-CM | POA: Diagnosis not present

## 2015-06-05 DIAGNOSIS — R262 Difficulty in walking, not elsewhere classified: Secondary | ICD-10-CM | POA: Diagnosis not present

## 2015-06-05 DIAGNOSIS — Z89611 Acquired absence of right leg above knee: Secondary | ICD-10-CM | POA: Diagnosis not present

## 2015-06-05 DIAGNOSIS — I639 Cerebral infarction, unspecified: Secondary | ICD-10-CM | POA: Diagnosis not present

## 2015-06-05 DIAGNOSIS — I2510 Atherosclerotic heart disease of native coronary artery without angina pectoris: Secondary | ICD-10-CM | POA: Diagnosis not present

## 2015-06-05 DIAGNOSIS — J449 Chronic obstructive pulmonary disease, unspecified: Secondary | ICD-10-CM | POA: Diagnosis not present

## 2015-06-11 DIAGNOSIS — I779 Disorder of arteries and arterioles, unspecified: Secondary | ICD-10-CM | POA: Diagnosis not present

## 2015-06-11 DIAGNOSIS — Z8961 Acquired absence of leg above knee: Secondary | ICD-10-CM | POA: Diagnosis not present

## 2015-06-11 DIAGNOSIS — G8918 Other acute postprocedural pain: Secondary | ICD-10-CM | POA: Diagnosis not present

## 2015-06-11 DIAGNOSIS — R262 Difficulty in walking, not elsewhere classified: Secondary | ICD-10-CM | POA: Diagnosis not present

## 2015-06-24 DIAGNOSIS — K5900 Constipation, unspecified: Secondary | ICD-10-CM | POA: Diagnosis not present

## 2015-06-24 DIAGNOSIS — Z959 Presence of cardiac and vascular implant and graft, unspecified: Secondary | ICD-10-CM | POA: Diagnosis not present

## 2015-06-24 DIAGNOSIS — Z79891 Long term (current) use of opiate analgesic: Secondary | ICD-10-CM | POA: Diagnosis not present

## 2015-06-24 DIAGNOSIS — Z4781 Encounter for orthopedic aftercare following surgical amputation: Secondary | ICD-10-CM | POA: Diagnosis not present

## 2015-06-24 DIAGNOSIS — G629 Polyneuropathy, unspecified: Secondary | ICD-10-CM | POA: Diagnosis not present

## 2015-06-24 DIAGNOSIS — J449 Chronic obstructive pulmonary disease, unspecified: Secondary | ICD-10-CM | POA: Diagnosis not present

## 2015-06-24 DIAGNOSIS — F329 Major depressive disorder, single episode, unspecified: Secondary | ICD-10-CM | POA: Diagnosis not present

## 2015-06-24 DIAGNOSIS — Z7982 Long term (current) use of aspirin: Secondary | ICD-10-CM | POA: Diagnosis not present

## 2015-06-24 DIAGNOSIS — I2510 Atherosclerotic heart disease of native coronary artery without angina pectoris: Secondary | ICD-10-CM | POA: Diagnosis not present

## 2015-06-24 DIAGNOSIS — Z48812 Encounter for surgical aftercare following surgery on the circulatory system: Secondary | ICD-10-CM | POA: Diagnosis not present

## 2015-06-24 DIAGNOSIS — I739 Peripheral vascular disease, unspecified: Secondary | ICD-10-CM | POA: Diagnosis not present

## 2015-06-24 DIAGNOSIS — I252 Old myocardial infarction: Secondary | ICD-10-CM | POA: Diagnosis not present

## 2015-06-24 DIAGNOSIS — Z89612 Acquired absence of left leg above knee: Secondary | ICD-10-CM | POA: Diagnosis not present

## 2015-06-24 DIAGNOSIS — I248 Other forms of acute ischemic heart disease: Secondary | ICD-10-CM | POA: Diagnosis not present

## 2015-06-24 DIAGNOSIS — I10 Essential (primary) hypertension: Secondary | ICD-10-CM | POA: Diagnosis not present

## 2015-06-28 DIAGNOSIS — F329 Major depressive disorder, single episode, unspecified: Secondary | ICD-10-CM | POA: Diagnosis not present

## 2015-06-28 DIAGNOSIS — F5101 Primary insomnia: Secondary | ICD-10-CM | POA: Diagnosis not present

## 2015-06-28 DIAGNOSIS — Z89611 Acquired absence of right leg above knee: Secondary | ICD-10-CM | POA: Diagnosis not present

## 2015-06-28 DIAGNOSIS — I2510 Atherosclerotic heart disease of native coronary artery without angina pectoris: Secondary | ICD-10-CM | POA: Diagnosis not present

## 2015-06-28 DIAGNOSIS — I739 Peripheral vascular disease, unspecified: Secondary | ICD-10-CM | POA: Diagnosis not present

## 2015-06-28 DIAGNOSIS — I10 Essential (primary) hypertension: Secondary | ICD-10-CM | POA: Diagnosis not present

## 2015-07-04 DIAGNOSIS — Z4802 Encounter for removal of sutures: Secondary | ICD-10-CM | POA: Diagnosis not present

## 2015-07-04 DIAGNOSIS — I739 Peripheral vascular disease, unspecified: Secondary | ICD-10-CM | POA: Diagnosis not present

## 2015-07-04 DIAGNOSIS — J449 Chronic obstructive pulmonary disease, unspecified: Secondary | ICD-10-CM | POA: Diagnosis not present

## 2015-07-04 DIAGNOSIS — I2510 Atherosclerotic heart disease of native coronary artery without angina pectoris: Secondary | ICD-10-CM | POA: Diagnosis not present

## 2015-07-04 DIAGNOSIS — Z89611 Acquired absence of right leg above knee: Secondary | ICD-10-CM | POA: Diagnosis not present

## 2015-07-04 DIAGNOSIS — Z8673 Personal history of transient ischemic attack (TIA), and cerebral infarction without residual deficits: Secondary | ICD-10-CM | POA: Diagnosis not present

## 2015-07-04 DIAGNOSIS — Z955 Presence of coronary angioplasty implant and graft: Secondary | ICD-10-CM | POA: Diagnosis not present

## 2015-07-04 DIAGNOSIS — Z969 Presence of functional implant, unspecified: Secondary | ICD-10-CM | POA: Diagnosis not present

## 2015-07-19 DIAGNOSIS — I248 Other forms of acute ischemic heart disease: Secondary | ICD-10-CM | POA: Diagnosis not present

## 2015-07-19 DIAGNOSIS — Z4781 Encounter for orthopedic aftercare following surgical amputation: Secondary | ICD-10-CM | POA: Diagnosis not present

## 2015-07-19 DIAGNOSIS — I739 Peripheral vascular disease, unspecified: Secondary | ICD-10-CM | POA: Diagnosis not present

## 2015-07-19 DIAGNOSIS — Z89611 Acquired absence of right leg above knee: Secondary | ICD-10-CM | POA: Diagnosis not present

## 2015-07-31 DIAGNOSIS — G629 Polyneuropathy, unspecified: Secondary | ICD-10-CM | POA: Diagnosis not present

## 2015-08-08 DIAGNOSIS — R269 Unspecified abnormalities of gait and mobility: Secondary | ICD-10-CM | POA: Diagnosis not present

## 2015-08-08 DIAGNOSIS — I739 Peripheral vascular disease, unspecified: Secondary | ICD-10-CM | POA: Diagnosis not present

## 2015-08-08 DIAGNOSIS — I5020 Unspecified systolic (congestive) heart failure: Secondary | ICD-10-CM | POA: Diagnosis not present

## 2015-08-08 DIAGNOSIS — Z8673 Personal history of transient ischemic attack (TIA), and cerebral infarction without residual deficits: Secondary | ICD-10-CM | POA: Diagnosis not present

## 2015-08-08 DIAGNOSIS — Z89611 Acquired absence of right leg above knee: Secondary | ICD-10-CM | POA: Diagnosis not present

## 2015-08-29 DIAGNOSIS — Z89611 Acquired absence of right leg above knee: Secondary | ICD-10-CM | POA: Diagnosis not present

## 2015-08-29 DIAGNOSIS — S90122A Contusion of left lesser toe(s) without damage to nail, initial encounter: Secondary | ICD-10-CM | POA: Diagnosis not present

## 2015-08-29 NOTE — Patient Outreach (Signed)
[facility]) Care Management  08/29/2015  [name] [name] 1940-04-23 [id number]   Late entry- closed [employer] program for social work for 04/05/15.  [name], BSW, MSW, LCSW [email or web address] Phone: [phone] Fax: [phone]

## 2015-09-07 DIAGNOSIS — I5020 Unspecified systolic (congestive) heart failure: Secondary | ICD-10-CM | POA: Diagnosis not present

## 2015-09-07 DIAGNOSIS — I739 Peripheral vascular disease, unspecified: Secondary | ICD-10-CM | POA: Diagnosis not present

## 2015-09-07 DIAGNOSIS — Z89611 Acquired absence of right leg above knee: Secondary | ICD-10-CM | POA: Diagnosis not present

## 2015-09-07 DIAGNOSIS — R269 Unspecified abnormalities of gait and mobility: Secondary | ICD-10-CM | POA: Diagnosis not present

## 2015-09-07 DIAGNOSIS — Z8673 Personal history of transient ischemic attack (TIA), and cerebral infarction without residual deficits: Secondary | ICD-10-CM | POA: Diagnosis not present

## 2015-09-13 DIAGNOSIS — R293 Abnormal posture: Secondary | ICD-10-CM | POA: Diagnosis not present

## 2015-09-13 DIAGNOSIS — M25651 Stiffness of right hip, not elsewhere classified: Secondary | ICD-10-CM | POA: Diagnosis not present

## 2015-09-13 DIAGNOSIS — Z4789 Encounter for other orthopedic aftercare: Secondary | ICD-10-CM | POA: Diagnosis not present

## 2015-09-13 DIAGNOSIS — R600 Localized edema: Secondary | ICD-10-CM | POA: Diagnosis not present

## 2015-09-13 DIAGNOSIS — M6281 Muscle weakness (generalized): Secondary | ICD-10-CM | POA: Diagnosis not present

## 2015-09-13 DIAGNOSIS — G546 Phantom limb syndrome with pain: Secondary | ICD-10-CM | POA: Diagnosis not present

## 2015-09-13 DIAGNOSIS — Z89611 Acquired absence of right leg above knee: Secondary | ICD-10-CM | POA: Diagnosis not present

## 2015-09-13 DIAGNOSIS — M25652 Stiffness of left hip, not elsewhere classified: Secondary | ICD-10-CM | POA: Diagnosis not present

## 2015-09-13 DIAGNOSIS — R2681 Unsteadiness on feet: Secondary | ICD-10-CM | POA: Diagnosis not present

## 2015-09-13 DIAGNOSIS — R262 Difficulty in walking, not elsewhere classified: Secondary | ICD-10-CM | POA: Diagnosis not present

## 2015-09-26 DIAGNOSIS — M6281 Muscle weakness (generalized): Secondary | ICD-10-CM | POA: Diagnosis not present

## 2015-09-26 DIAGNOSIS — R293 Abnormal posture: Secondary | ICD-10-CM | POA: Diagnosis not present

## 2015-09-26 DIAGNOSIS — R2681 Unsteadiness on feet: Secondary | ICD-10-CM | POA: Diagnosis not present

## 2015-09-26 DIAGNOSIS — R600 Localized edema: Secondary | ICD-10-CM | POA: Diagnosis not present

## 2015-09-26 DIAGNOSIS — M25651 Stiffness of right hip, not elsewhere classified: Secondary | ICD-10-CM | POA: Diagnosis not present

## 2015-09-26 DIAGNOSIS — Z4789 Encounter for other orthopedic aftercare: Secondary | ICD-10-CM | POA: Diagnosis not present

## 2015-09-26 DIAGNOSIS — M25652 Stiffness of left hip, not elsewhere classified: Secondary | ICD-10-CM | POA: Diagnosis not present

## 2015-09-26 DIAGNOSIS — Z89611 Acquired absence of right leg above knee: Secondary | ICD-10-CM | POA: Diagnosis not present

## 2015-09-26 DIAGNOSIS — G546 Phantom limb syndrome with pain: Secondary | ICD-10-CM | POA: Diagnosis not present

## 2015-10-03 DIAGNOSIS — I2510 Atherosclerotic heart disease of native coronary artery without angina pectoris: Secondary | ICD-10-CM | POA: Diagnosis not present

## 2015-10-03 DIAGNOSIS — Z89611 Acquired absence of right leg above knee: Secondary | ICD-10-CM | POA: Diagnosis not present

## 2015-10-03 DIAGNOSIS — I739 Peripheral vascular disease, unspecified: Secondary | ICD-10-CM | POA: Diagnosis not present

## 2015-10-03 DIAGNOSIS — G546 Phantom limb syndrome with pain: Secondary | ICD-10-CM | POA: Diagnosis not present

## 2015-10-08 DIAGNOSIS — R269 Unspecified abnormalities of gait and mobility: Secondary | ICD-10-CM | POA: Diagnosis not present

## 2015-10-08 DIAGNOSIS — Z89611 Acquired absence of right leg above knee: Secondary | ICD-10-CM | POA: Diagnosis not present

## 2015-10-08 DIAGNOSIS — Z8673 Personal history of transient ischemic attack (TIA), and cerebral infarction without residual deficits: Secondary | ICD-10-CM | POA: Diagnosis not present

## 2015-10-08 DIAGNOSIS — I5020 Unspecified systolic (congestive) heart failure: Secondary | ICD-10-CM | POA: Diagnosis not present

## 2015-10-08 DIAGNOSIS — I739 Peripheral vascular disease, unspecified: Secondary | ICD-10-CM | POA: Diagnosis not present

## 2015-10-24 DIAGNOSIS — Z9889 Other specified postprocedural states: Secondary | ICD-10-CM | POA: Diagnosis not present

## 2015-10-24 DIAGNOSIS — Z89611 Acquired absence of right leg above knee: Secondary | ICD-10-CM | POA: Diagnosis not present

## 2015-10-24 DIAGNOSIS — M79672 Pain in left foot: Secondary | ICD-10-CM | POA: Diagnosis not present

## 2015-10-24 DIAGNOSIS — I748 Embolism and thrombosis of other arteries: Secondary | ICD-10-CM | POA: Diagnosis not present

## 2015-10-24 DIAGNOSIS — I739 Peripheral vascular disease, unspecified: Secondary | ICD-10-CM | POA: Diagnosis not present

## 2015-10-24 DIAGNOSIS — I82812 Embolism and thrombosis of superficial veins of left lower extremities: Secondary | ICD-10-CM | POA: Diagnosis not present

## 2015-10-24 DIAGNOSIS — I6529 Occlusion and stenosis of unspecified carotid artery: Secondary | ICD-10-CM | POA: Diagnosis not present

## 2015-10-25 DIAGNOSIS — Z89611 Acquired absence of right leg above knee: Secondary | ICD-10-CM | POA: Diagnosis not present

## 2015-10-26 DIAGNOSIS — G546 Phantom limb syndrome with pain: Secondary | ICD-10-CM | POA: Diagnosis not present

## 2015-10-26 DIAGNOSIS — R262 Difficulty in walking, not elsewhere classified: Secondary | ICD-10-CM | POA: Diagnosis not present

## 2015-10-26 DIAGNOSIS — Z4789 Encounter for other orthopedic aftercare: Secondary | ICD-10-CM | POA: Diagnosis not present

## 2015-10-26 DIAGNOSIS — M25651 Stiffness of right hip, not elsewhere classified: Secondary | ICD-10-CM | POA: Diagnosis not present

## 2015-10-26 DIAGNOSIS — M6281 Muscle weakness (generalized): Secondary | ICD-10-CM | POA: Diagnosis not present

## 2015-10-26 DIAGNOSIS — Z89611 Acquired absence of right leg above knee: Secondary | ICD-10-CM | POA: Diagnosis not present

## 2015-10-26 DIAGNOSIS — M25652 Stiffness of left hip, not elsewhere classified: Secondary | ICD-10-CM | POA: Diagnosis not present

## 2015-10-26 DIAGNOSIS — R2681 Unsteadiness on feet: Secondary | ICD-10-CM | POA: Diagnosis not present

## 2015-10-26 DIAGNOSIS — R293 Abnormal posture: Secondary | ICD-10-CM | POA: Diagnosis not present

## 2015-10-26 DIAGNOSIS — R600 Localized edema: Secondary | ICD-10-CM | POA: Diagnosis not present

## 2015-11-08 DIAGNOSIS — Z8673 Personal history of transient ischemic attack (TIA), and cerebral infarction without residual deficits: Secondary | ICD-10-CM | POA: Diagnosis not present

## 2015-11-08 DIAGNOSIS — R269 Unspecified abnormalities of gait and mobility: Secondary | ICD-10-CM | POA: Diagnosis not present

## 2015-11-08 DIAGNOSIS — I739 Peripheral vascular disease, unspecified: Secondary | ICD-10-CM | POA: Diagnosis not present

## 2015-11-08 DIAGNOSIS — Z89611 Acquired absence of right leg above knee: Secondary | ICD-10-CM | POA: Diagnosis not present

## 2015-11-08 DIAGNOSIS — I5020 Unspecified systolic (congestive) heart failure: Secondary | ICD-10-CM | POA: Diagnosis not present

## 2015-11-15 DIAGNOSIS — M79675 Pain in left toe(s): Secondary | ICD-10-CM | POA: Diagnosis not present

## 2015-11-15 DIAGNOSIS — G8929 Other chronic pain: Secondary | ICD-10-CM | POA: Diagnosis not present

## 2015-11-15 DIAGNOSIS — I739 Peripheral vascular disease, unspecified: Secondary | ICD-10-CM | POA: Diagnosis not present

## 2015-11-27 DIAGNOSIS — Z23 Encounter for immunization: Secondary | ICD-10-CM | POA: Diagnosis not present

## 2015-11-28 DIAGNOSIS — Z89611 Acquired absence of right leg above knee: Secondary | ICD-10-CM | POA: Diagnosis not present

## 2015-11-28 DIAGNOSIS — R600 Localized edema: Secondary | ICD-10-CM | POA: Diagnosis not present

## 2015-11-28 DIAGNOSIS — M6281 Muscle weakness (generalized): Secondary | ICD-10-CM | POA: Diagnosis not present

## 2015-11-28 DIAGNOSIS — Z4789 Encounter for other orthopedic aftercare: Secondary | ICD-10-CM | POA: Diagnosis not present

## 2015-11-28 DIAGNOSIS — M25651 Stiffness of right hip, not elsewhere classified: Secondary | ICD-10-CM | POA: Diagnosis not present

## 2015-11-28 DIAGNOSIS — R293 Abnormal posture: Secondary | ICD-10-CM | POA: Diagnosis not present

## 2015-11-28 DIAGNOSIS — G546 Phantom limb syndrome with pain: Secondary | ICD-10-CM | POA: Diagnosis not present

## 2015-11-28 DIAGNOSIS — R2681 Unsteadiness on feet: Secondary | ICD-10-CM | POA: Diagnosis not present

## 2015-11-28 DIAGNOSIS — M25652 Stiffness of left hip, not elsewhere classified: Secondary | ICD-10-CM | POA: Diagnosis not present

## 2015-12-06 DIAGNOSIS — G546 Phantom limb syndrome with pain: Secondary | ICD-10-CM | POA: Diagnosis not present

## 2015-12-08 DIAGNOSIS — I5020 Unspecified systolic (congestive) heart failure: Secondary | ICD-10-CM | POA: Diagnosis not present

## 2015-12-08 DIAGNOSIS — R269 Unspecified abnormalities of gait and mobility: Secondary | ICD-10-CM | POA: Diagnosis not present

## 2015-12-08 DIAGNOSIS — Z8673 Personal history of transient ischemic attack (TIA), and cerebral infarction without residual deficits: Secondary | ICD-10-CM | POA: Diagnosis not present

## 2015-12-08 DIAGNOSIS — I739 Peripheral vascular disease, unspecified: Secondary | ICD-10-CM | POA: Diagnosis not present

## 2015-12-08 DIAGNOSIS — Z89611 Acquired absence of right leg above knee: Secondary | ICD-10-CM | POA: Diagnosis not present

## 2015-12-26 DIAGNOSIS — Z4789 Encounter for other orthopedic aftercare: Secondary | ICD-10-CM | POA: Diagnosis not present

## 2015-12-26 DIAGNOSIS — R293 Abnormal posture: Secondary | ICD-10-CM | POA: Diagnosis not present

## 2015-12-26 DIAGNOSIS — M25652 Stiffness of left hip, not elsewhere classified: Secondary | ICD-10-CM | POA: Diagnosis not present

## 2015-12-26 DIAGNOSIS — M6281 Muscle weakness (generalized): Secondary | ICD-10-CM | POA: Diagnosis not present

## 2015-12-26 DIAGNOSIS — M25651 Stiffness of right hip, not elsewhere classified: Secondary | ICD-10-CM | POA: Diagnosis not present

## 2015-12-26 DIAGNOSIS — G546 Phantom limb syndrome with pain: Secondary | ICD-10-CM | POA: Diagnosis not present

## 2015-12-26 DIAGNOSIS — R2681 Unsteadiness on feet: Secondary | ICD-10-CM | POA: Diagnosis not present

## 2015-12-26 DIAGNOSIS — R600 Localized edema: Secondary | ICD-10-CM | POA: Diagnosis not present

## 2015-12-26 DIAGNOSIS — Z89611 Acquired absence of right leg above knee: Secondary | ICD-10-CM | POA: Diagnosis not present

## 2016-01-08 DIAGNOSIS — Z89611 Acquired absence of right leg above knee: Secondary | ICD-10-CM | POA: Diagnosis not present

## 2016-01-08 DIAGNOSIS — R269 Unspecified abnormalities of gait and mobility: Secondary | ICD-10-CM | POA: Diagnosis not present

## 2016-01-08 DIAGNOSIS — I5020 Unspecified systolic (congestive) heart failure: Secondary | ICD-10-CM | POA: Diagnosis not present

## 2016-01-08 DIAGNOSIS — Z8673 Personal history of transient ischemic attack (TIA), and cerebral infarction without residual deficits: Secondary | ICD-10-CM | POA: Diagnosis not present

## 2016-01-08 DIAGNOSIS — I739 Peripheral vascular disease, unspecified: Secondary | ICD-10-CM | POA: Diagnosis not present

## 2016-01-09 DIAGNOSIS — I739 Peripheral vascular disease, unspecified: Secondary | ICD-10-CM | POA: Diagnosis not present

## 2016-01-09 DIAGNOSIS — I6523 Occlusion and stenosis of bilateral carotid arteries: Secondary | ICD-10-CM | POA: Diagnosis not present

## 2016-01-09 DIAGNOSIS — I6522 Occlusion and stenosis of left carotid artery: Secondary | ICD-10-CM | POA: Diagnosis not present

## 2016-01-09 DIAGNOSIS — I70202 Unspecified atherosclerosis of native arteries of extremities, left leg: Secondary | ICD-10-CM | POA: Diagnosis not present

## 2016-01-16 DIAGNOSIS — I10 Essential (primary) hypertension: Secondary | ICD-10-CM | POA: Diagnosis not present

## 2016-01-16 DIAGNOSIS — I998 Other disorder of circulatory system: Secondary | ICD-10-CM | POA: Diagnosis not present

## 2016-01-16 DIAGNOSIS — E7800 Pure hypercholesterolemia, unspecified: Secondary | ICD-10-CM | POA: Diagnosis not present

## 2016-01-16 DIAGNOSIS — Z89611 Acquired absence of right leg above knee: Secondary | ICD-10-CM | POA: Diagnosis not present

## 2016-01-16 DIAGNOSIS — I2510 Atherosclerotic heart disease of native coronary artery without angina pectoris: Secondary | ICD-10-CM | POA: Diagnosis not present

## 2016-01-16 DIAGNOSIS — Z8673 Personal history of transient ischemic attack (TIA), and cerebral infarction without residual deficits: Secondary | ICD-10-CM | POA: Diagnosis not present

## 2016-01-16 DIAGNOSIS — Z7902 Long term (current) use of antithrombotics/antiplatelets: Secondary | ICD-10-CM | POA: Diagnosis not present

## 2016-01-16 DIAGNOSIS — I252 Old myocardial infarction: Secondary | ICD-10-CM | POA: Diagnosis not present

## 2016-01-16 DIAGNOSIS — I700 Atherosclerosis of aorta: Secondary | ICD-10-CM | POA: Diagnosis not present

## 2016-01-16 DIAGNOSIS — Z9049 Acquired absence of other specified parts of digestive tract: Secondary | ICD-10-CM | POA: Diagnosis not present

## 2016-01-16 DIAGNOSIS — Z9862 Peripheral vascular angioplasty status: Secondary | ICD-10-CM | POA: Diagnosis not present

## 2016-01-16 DIAGNOSIS — R569 Unspecified convulsions: Secondary | ICD-10-CM | POA: Diagnosis not present

## 2016-01-16 DIAGNOSIS — I739 Peripheral vascular disease, unspecified: Secondary | ICD-10-CM | POA: Diagnosis not present

## 2016-01-16 DIAGNOSIS — Z87891 Personal history of nicotine dependence: Secondary | ICD-10-CM | POA: Diagnosis not present

## 2016-01-16 DIAGNOSIS — I714 Abdominal aortic aneurysm, without rupture: Secondary | ICD-10-CM | POA: Diagnosis not present

## 2016-01-16 DIAGNOSIS — Z79899 Other long term (current) drug therapy: Secondary | ICD-10-CM | POA: Diagnosis not present

## 2016-01-22 DIAGNOSIS — I70222 Atherosclerosis of native arteries of extremities with rest pain, left leg: Secondary | ICD-10-CM | POA: Diagnosis not present

## 2016-01-22 DIAGNOSIS — I779 Disorder of arteries and arterioles, unspecified: Secondary | ICD-10-CM

## 2016-01-22 HISTORY — DX: Disorder of arteries and arterioles, unspecified: I77.9

## 2016-01-23 DIAGNOSIS — I779 Disorder of arteries and arterioles, unspecified: Secondary | ICD-10-CM | POA: Diagnosis not present

## 2016-01-23 DIAGNOSIS — Z01818 Encounter for other preprocedural examination: Secondary | ICD-10-CM | POA: Diagnosis not present

## 2016-01-30 DIAGNOSIS — Z8673 Personal history of transient ischemic attack (TIA), and cerebral infarction without residual deficits: Secondary | ICD-10-CM | POA: Diagnosis not present

## 2016-01-30 DIAGNOSIS — T402X5A Adverse effect of other opioids, initial encounter: Secondary | ICD-10-CM | POA: Diagnosis not present

## 2016-01-30 DIAGNOSIS — Z7982 Long term (current) use of aspirin: Secondary | ICD-10-CM | POA: Diagnosis not present

## 2016-01-30 DIAGNOSIS — I110 Hypertensive heart disease with heart failure: Secondary | ICD-10-CM | POA: Diagnosis not present

## 2016-01-30 DIAGNOSIS — I959 Hypotension, unspecified: Secondary | ICD-10-CM | POA: Diagnosis not present

## 2016-01-30 DIAGNOSIS — Z87891 Personal history of nicotine dependence: Secondary | ICD-10-CM | POA: Diagnosis not present

## 2016-01-30 DIAGNOSIS — D7582 Heparin induced thrombocytopenia (HIT): Secondary | ICD-10-CM | POA: Diagnosis not present

## 2016-01-30 DIAGNOSIS — Z89611 Acquired absence of right leg above knee: Secondary | ICD-10-CM | POA: Diagnosis not present

## 2016-01-30 DIAGNOSIS — I248 Other forms of acute ischemic heart disease: Secondary | ICD-10-CM | POA: Diagnosis not present

## 2016-01-30 DIAGNOSIS — I739 Peripheral vascular disease, unspecified: Secondary | ICD-10-CM | POA: Diagnosis not present

## 2016-01-30 DIAGNOSIS — I480 Paroxysmal atrial fibrillation: Secondary | ICD-10-CM | POA: Diagnosis not present

## 2016-01-30 DIAGNOSIS — G9341 Metabolic encephalopathy: Secondary | ICD-10-CM | POA: Diagnosis not present

## 2016-01-30 DIAGNOSIS — I5021 Acute systolic (congestive) heart failure: Secondary | ICD-10-CM | POA: Diagnosis not present

## 2016-01-30 DIAGNOSIS — Z89511 Acquired absence of right leg below knee: Secondary | ICD-10-CM | POA: Diagnosis not present

## 2016-01-30 DIAGNOSIS — N400 Enlarged prostate without lower urinary tract symptoms: Secondary | ICD-10-CM | POA: Diagnosis not present

## 2016-01-30 DIAGNOSIS — J90 Pleural effusion, not elsewhere classified: Secondary | ICD-10-CM | POA: Diagnosis not present

## 2016-01-30 DIAGNOSIS — I255 Ischemic cardiomyopathy: Secondary | ICD-10-CM | POA: Diagnosis not present

## 2016-01-30 DIAGNOSIS — R4182 Altered mental status, unspecified: Secondary | ICD-10-CM | POA: Diagnosis not present

## 2016-01-30 DIAGNOSIS — I252 Old myocardial infarction: Secondary | ICD-10-CM | POA: Diagnosis not present

## 2016-01-30 DIAGNOSIS — I2510 Atherosclerotic heart disease of native coronary artery without angina pectoris: Secondary | ICD-10-CM | POA: Diagnosis not present

## 2016-01-30 DIAGNOSIS — E7800 Pure hypercholesterolemia, unspecified: Secondary | ICD-10-CM | POA: Diagnosis not present

## 2016-01-30 DIAGNOSIS — G8929 Other chronic pain: Secondary | ICD-10-CM | POA: Diagnosis not present

## 2016-01-30 DIAGNOSIS — G4733 Obstructive sleep apnea (adult) (pediatric): Secondary | ICD-10-CM | POA: Diagnosis not present

## 2016-01-30 DIAGNOSIS — I4891 Unspecified atrial fibrillation: Secondary | ICD-10-CM | POA: Diagnosis not present

## 2016-01-30 DIAGNOSIS — Z7901 Long term (current) use of anticoagulants: Secondary | ICD-10-CM | POA: Diagnosis not present

## 2016-01-30 DIAGNOSIS — G629 Polyneuropathy, unspecified: Secondary | ICD-10-CM | POA: Diagnosis not present

## 2016-01-31 DIAGNOSIS — I4589 Other specified conduction disorders: Secondary | ICD-10-CM | POA: Diagnosis not present

## 2016-01-31 DIAGNOSIS — R4182 Altered mental status, unspecified: Secondary | ICD-10-CM | POA: Diagnosis not present

## 2016-01-31 DIAGNOSIS — I4891 Unspecified atrial fibrillation: Secondary | ICD-10-CM | POA: Diagnosis not present

## 2016-01-31 DIAGNOSIS — I959 Hypotension, unspecified: Secondary | ICD-10-CM | POA: Diagnosis not present

## 2016-01-31 DIAGNOSIS — R7989 Other specified abnormal findings of blood chemistry: Secondary | ICD-10-CM | POA: Diagnosis not present

## 2016-01-31 DIAGNOSIS — I998 Other disorder of circulatory system: Secondary | ICD-10-CM | POA: Diagnosis not present

## 2016-01-31 DIAGNOSIS — I2510 Atherosclerotic heart disease of native coronary artery without angina pectoris: Secondary | ICD-10-CM | POA: Diagnosis not present

## 2016-01-31 DIAGNOSIS — G9341 Metabolic encephalopathy: Secondary | ICD-10-CM | POA: Diagnosis not present

## 2016-01-31 DIAGNOSIS — I5020 Unspecified systolic (congestive) heart failure: Secondary | ICD-10-CM | POA: Diagnosis not present

## 2016-02-01 DIAGNOSIS — R4182 Altered mental status, unspecified: Secondary | ICD-10-CM | POA: Diagnosis not present

## 2016-02-01 DIAGNOSIS — R7989 Other specified abnormal findings of blood chemistry: Secondary | ICD-10-CM | POA: Diagnosis not present

## 2016-02-01 DIAGNOSIS — I255 Ischemic cardiomyopathy: Secondary | ICD-10-CM | POA: Insufficient documentation

## 2016-02-01 DIAGNOSIS — I5042 Chronic combined systolic (congestive) and diastolic (congestive) heart failure: Secondary | ICD-10-CM | POA: Insufficient documentation

## 2016-02-01 DIAGNOSIS — I959 Hypotension, unspecified: Secondary | ICD-10-CM | POA: Diagnosis not present

## 2016-02-01 DIAGNOSIS — I5021 Acute systolic (congestive) heart failure: Secondary | ICD-10-CM | POA: Insufficient documentation

## 2016-02-01 DIAGNOSIS — I480 Paroxysmal atrial fibrillation: Secondary | ICD-10-CM | POA: Diagnosis not present

## 2016-02-01 DIAGNOSIS — G9341 Metabolic encephalopathy: Secondary | ICD-10-CM | POA: Diagnosis not present

## 2016-02-01 DIAGNOSIS — I2510 Atherosclerotic heart disease of native coronary artery without angina pectoris: Secondary | ICD-10-CM | POA: Diagnosis not present

## 2016-02-01 HISTORY — DX: Acute systolic (congestive) heart failure: I50.21

## 2016-02-01 HISTORY — DX: Chronic combined systolic (congestive) and diastolic (congestive) heart failure: I50.42

## 2016-02-01 HISTORY — DX: Ischemic cardiomyopathy: I25.5

## 2016-02-02 DIAGNOSIS — I4891 Unspecified atrial fibrillation: Secondary | ICD-10-CM | POA: Diagnosis not present

## 2016-02-02 DIAGNOSIS — G4733 Obstructive sleep apnea (adult) (pediatric): Secondary | ICD-10-CM | POA: Diagnosis not present

## 2016-02-02 DIAGNOSIS — I248 Other forms of acute ischemic heart disease: Secondary | ICD-10-CM | POA: Diagnosis not present

## 2016-02-02 DIAGNOSIS — I6521 Occlusion and stenosis of right carotid artery: Secondary | ICD-10-CM | POA: Diagnosis not present

## 2016-02-02 DIAGNOSIS — G629 Polyneuropathy, unspecified: Secondary | ICD-10-CM | POA: Diagnosis not present

## 2016-02-02 DIAGNOSIS — N400 Enlarged prostate without lower urinary tract symptoms: Secondary | ICD-10-CM | POA: Diagnosis not present

## 2016-02-02 DIAGNOSIS — D696 Thrombocytopenia, unspecified: Secondary | ICD-10-CM | POA: Diagnosis not present

## 2016-02-02 DIAGNOSIS — I739 Peripheral vascular disease, unspecified: Secondary | ICD-10-CM | POA: Diagnosis not present

## 2016-02-02 DIAGNOSIS — R4182 Altered mental status, unspecified: Secondary | ICD-10-CM | POA: Diagnosis not present

## 2016-02-03 DIAGNOSIS — Z8673 Personal history of transient ischemic attack (TIA), and cerebral infarction without residual deficits: Secondary | ICD-10-CM | POA: Diagnosis not present

## 2016-02-03 DIAGNOSIS — I739 Peripheral vascular disease, unspecified: Secondary | ICD-10-CM | POA: Diagnosis not present

## 2016-02-03 DIAGNOSIS — Z9889 Other specified postprocedural states: Secondary | ICD-10-CM | POA: Diagnosis not present

## 2016-02-03 DIAGNOSIS — I255 Ischemic cardiomyopathy: Secondary | ICD-10-CM | POA: Diagnosis not present

## 2016-02-03 DIAGNOSIS — G4733 Obstructive sleep apnea (adult) (pediatric): Secondary | ICD-10-CM | POA: Diagnosis not present

## 2016-02-03 DIAGNOSIS — I248 Other forms of acute ischemic heart disease: Secondary | ICD-10-CM | POA: Diagnosis not present

## 2016-02-03 DIAGNOSIS — N400 Enlarged prostate without lower urinary tract symptoms: Secondary | ICD-10-CM | POA: Diagnosis not present

## 2016-02-03 DIAGNOSIS — E785 Hyperlipidemia, unspecified: Secondary | ICD-10-CM | POA: Diagnosis not present

## 2016-02-03 DIAGNOSIS — I2510 Atherosclerotic heart disease of native coronary artery without angina pectoris: Secondary | ICD-10-CM | POA: Diagnosis not present

## 2016-02-03 DIAGNOSIS — Z7901 Long term (current) use of anticoagulants: Secondary | ICD-10-CM | POA: Diagnosis not present

## 2016-02-03 DIAGNOSIS — I5021 Acute systolic (congestive) heart failure: Secondary | ICD-10-CM | POA: Diagnosis not present

## 2016-02-03 DIAGNOSIS — G629 Polyneuropathy, unspecified: Secondary | ICD-10-CM | POA: Diagnosis not present

## 2016-02-03 DIAGNOSIS — I4891 Unspecified atrial fibrillation: Secondary | ICD-10-CM | POA: Diagnosis not present

## 2016-02-03 DIAGNOSIS — Z89611 Acquired absence of right leg above knee: Secondary | ICD-10-CM | POA: Diagnosis not present

## 2016-02-03 DIAGNOSIS — R4182 Altered mental status, unspecified: Secondary | ICD-10-CM | POA: Diagnosis not present

## 2016-02-03 DIAGNOSIS — I110 Hypertensive heart disease with heart failure: Secondary | ICD-10-CM | POA: Diagnosis not present

## 2016-02-03 DIAGNOSIS — Z8679 Personal history of other diseases of the circulatory system: Secondary | ICD-10-CM | POA: Diagnosis not present

## 2016-02-03 DIAGNOSIS — Z9861 Coronary angioplasty status: Secondary | ICD-10-CM | POA: Diagnosis not present

## 2016-02-04 DIAGNOSIS — I5021 Acute systolic (congestive) heart failure: Secondary | ICD-10-CM | POA: Diagnosis not present

## 2016-02-04 DIAGNOSIS — R4182 Altered mental status, unspecified: Secondary | ICD-10-CM | POA: Diagnosis not present

## 2016-02-04 DIAGNOSIS — Z8679 Personal history of other diseases of the circulatory system: Secondary | ICD-10-CM | POA: Diagnosis not present

## 2016-02-04 DIAGNOSIS — Z9889 Other specified postprocedural states: Secondary | ICD-10-CM | POA: Diagnosis not present

## 2016-02-04 DIAGNOSIS — Z9861 Coronary angioplasty status: Secondary | ICD-10-CM | POA: Diagnosis not present

## 2016-02-04 DIAGNOSIS — G629 Polyneuropathy, unspecified: Secondary | ICD-10-CM | POA: Diagnosis not present

## 2016-02-04 DIAGNOSIS — I739 Peripheral vascular disease, unspecified: Secondary | ICD-10-CM | POA: Diagnosis not present

## 2016-02-04 DIAGNOSIS — Z8673 Personal history of transient ischemic attack (TIA), and cerebral infarction without residual deficits: Secondary | ICD-10-CM | POA: Diagnosis not present

## 2016-02-04 DIAGNOSIS — I4891 Unspecified atrial fibrillation: Secondary | ICD-10-CM | POA: Diagnosis not present

## 2016-02-04 DIAGNOSIS — I248 Other forms of acute ischemic heart disease: Secondary | ICD-10-CM | POA: Diagnosis not present

## 2016-02-04 DIAGNOSIS — G4733 Obstructive sleep apnea (adult) (pediatric): Secondary | ICD-10-CM | POA: Diagnosis not present

## 2016-02-04 DIAGNOSIS — I255 Ischemic cardiomyopathy: Secondary | ICD-10-CM | POA: Diagnosis not present

## 2016-02-04 DIAGNOSIS — N400 Enlarged prostate without lower urinary tract symptoms: Secondary | ICD-10-CM | POA: Diagnosis not present

## 2016-02-04 DIAGNOSIS — I2510 Atherosclerotic heart disease of native coronary artery without angina pectoris: Secondary | ICD-10-CM | POA: Diagnosis not present

## 2016-02-04 DIAGNOSIS — E785 Hyperlipidemia, unspecified: Secondary | ICD-10-CM | POA: Diagnosis not present

## 2016-02-04 DIAGNOSIS — I110 Hypertensive heart disease with heart failure: Secondary | ICD-10-CM | POA: Diagnosis not present

## 2016-02-04 DIAGNOSIS — Z89611 Acquired absence of right leg above knee: Secondary | ICD-10-CM | POA: Diagnosis not present

## 2016-02-05 DIAGNOSIS — D696 Thrombocytopenia, unspecified: Secondary | ICD-10-CM | POA: Diagnosis not present

## 2016-02-05 DIAGNOSIS — I4891 Unspecified atrial fibrillation: Secondary | ICD-10-CM | POA: Diagnosis not present

## 2016-02-05 DIAGNOSIS — G4733 Obstructive sleep apnea (adult) (pediatric): Secondary | ICD-10-CM | POA: Diagnosis not present

## 2016-02-05 DIAGNOSIS — I739 Peripheral vascular disease, unspecified: Secondary | ICD-10-CM | POA: Diagnosis not present

## 2016-02-05 DIAGNOSIS — G629 Polyneuropathy, unspecified: Secondary | ICD-10-CM | POA: Diagnosis not present

## 2016-02-05 DIAGNOSIS — G9340 Encephalopathy, unspecified: Secondary | ICD-10-CM | POA: Diagnosis not present

## 2016-02-05 DIAGNOSIS — R4182 Altered mental status, unspecified: Secondary | ICD-10-CM | POA: Diagnosis not present

## 2016-02-05 DIAGNOSIS — N400 Enlarged prostate without lower urinary tract symptoms: Secondary | ICD-10-CM | POA: Diagnosis not present

## 2016-02-05 DIAGNOSIS — I509 Heart failure, unspecified: Secondary | ICD-10-CM | POA: Diagnosis not present

## 2016-02-05 DIAGNOSIS — I2510 Atherosclerotic heart disease of native coronary artery without angina pectoris: Secondary | ICD-10-CM | POA: Diagnosis not present

## 2016-02-06 DIAGNOSIS — N400 Enlarged prostate without lower urinary tract symptoms: Secondary | ICD-10-CM | POA: Diagnosis not present

## 2016-02-06 DIAGNOSIS — G629 Polyneuropathy, unspecified: Secondary | ICD-10-CM | POA: Diagnosis not present

## 2016-02-06 DIAGNOSIS — I4891 Unspecified atrial fibrillation: Secondary | ICD-10-CM | POA: Diagnosis not present

## 2016-02-06 DIAGNOSIS — I739 Peripheral vascular disease, unspecified: Secondary | ICD-10-CM | POA: Diagnosis not present

## 2016-02-06 DIAGNOSIS — I509 Heart failure, unspecified: Secondary | ICD-10-CM | POA: Diagnosis not present

## 2016-02-06 DIAGNOSIS — I2510 Atherosclerotic heart disease of native coronary artery without angina pectoris: Secondary | ICD-10-CM | POA: Diagnosis not present

## 2016-02-06 DIAGNOSIS — R4182 Altered mental status, unspecified: Secondary | ICD-10-CM | POA: Diagnosis not present

## 2016-02-06 DIAGNOSIS — G4733 Obstructive sleep apnea (adult) (pediatric): Secondary | ICD-10-CM | POA: Diagnosis not present

## 2016-02-07 DIAGNOSIS — I739 Peripheral vascular disease, unspecified: Secondary | ICD-10-CM | POA: Diagnosis not present

## 2016-02-07 DIAGNOSIS — G4733 Obstructive sleep apnea (adult) (pediatric): Secondary | ICD-10-CM | POA: Diagnosis not present

## 2016-02-07 DIAGNOSIS — G629 Polyneuropathy, unspecified: Secondary | ICD-10-CM | POA: Diagnosis not present

## 2016-02-07 DIAGNOSIS — N400 Enlarged prostate without lower urinary tract symptoms: Secondary | ICD-10-CM | POA: Diagnosis not present

## 2016-02-07 DIAGNOSIS — I5020 Unspecified systolic (congestive) heart failure: Secondary | ICD-10-CM | POA: Diagnosis not present

## 2016-02-07 DIAGNOSIS — R4182 Altered mental status, unspecified: Secondary | ICD-10-CM | POA: Diagnosis not present

## 2016-02-07 DIAGNOSIS — I4891 Unspecified atrial fibrillation: Secondary | ICD-10-CM | POA: Diagnosis not present

## 2016-02-07 DIAGNOSIS — I2510 Atherosclerotic heart disease of native coronary artery without angina pectoris: Secondary | ICD-10-CM | POA: Diagnosis not present

## 2016-02-07 DIAGNOSIS — Z89611 Acquired absence of right leg above knee: Secondary | ICD-10-CM | POA: Diagnosis not present

## 2016-02-07 DIAGNOSIS — I509 Heart failure, unspecified: Secondary | ICD-10-CM | POA: Diagnosis not present

## 2016-02-07 DIAGNOSIS — R269 Unspecified abnormalities of gait and mobility: Secondary | ICD-10-CM | POA: Diagnosis not present

## 2016-02-07 DIAGNOSIS — Z8673 Personal history of transient ischemic attack (TIA), and cerebral infarction without residual deficits: Secondary | ICD-10-CM | POA: Diagnosis not present

## 2016-02-08 DIAGNOSIS — I4891 Unspecified atrial fibrillation: Secondary | ICD-10-CM | POA: Diagnosis not present

## 2016-02-08 DIAGNOSIS — Z7901 Long term (current) use of anticoagulants: Secondary | ICD-10-CM | POA: Diagnosis not present

## 2016-02-15 ENCOUNTER — Other Ambulatory Visit: Payer: Self-pay

## 2016-02-15 DIAGNOSIS — I4891 Unspecified atrial fibrillation: Secondary | ICD-10-CM | POA: Diagnosis not present

## 2016-02-15 DIAGNOSIS — Z7901 Long term (current) use of anticoagulants: Secondary | ICD-10-CM | POA: Diagnosis not present

## 2016-02-19 ENCOUNTER — Other Ambulatory Visit: Payer: Self-pay

## 2016-02-19 NOTE — Patient Outreach (Signed)
[facility]) Care Management  02/19/2016  [name] [name] [birth year] [id number]  Late entry for 02/15/16 Referral date: 02/13/16 Referral source: Post hospital discharge from [facility], [facility] hospital Referral reason:  Transition of care follow up CONSENT: HIPPA verified with patient. Patient gave verbal consent to speak with his wife, [name] [name] regarding all of his health information.  PROVIDERS:  Dr. Juleen China - primary MD Dr. Aleatha Borer - vascular surgeon  SOCIAL:  Patient lives with spouse  SUBJECTIVE:  Telephone call to patient and spouse.  Discussed and offered [facility] care management services. Patient / wife agreed to services. Patient confirms he was recently discharged for [facility] med center due to weakness and his blood pressure. Patient states he has blockage in his left leg and will eventually be having leg amputated.  Patient states his right left has been amputated in the  Past.Patient states he is bound to his wheelchair.  Patient states he has a follow up appointment scheduled with his vascular doctor.  Patient states he is unsure of the exact date.  Patient states he had a follow up  Visit with his primary MD this week.  Patient states while in the hospital test results showed blockage behind his heart. Patients wife states due to patients health issues he is unable to have a bypass.  States patient will have to be managed with medication. Wife states patient has been newly diagnosed with severe atrial fibrillation and congestive heart failure during this hospitalization. Wife states patient still deals with some depression related to the loss of their 2 children.  Wife states the loss one child 10 yeas ago and another child 2 1/2 years ago.  Patient denies offer for [facility] care management social worker follow up for depression. Wife states patient has a prescription for outpatient physical therapy. Wife states patient is  on multiple medications. Patient / wife verbally agreed to pharmacy follow by [facility] care management  Pharmacist.      ASSESSMENT: Transition of care follow up.  Patient will benefit from community case management transition of care follow up and pharmacy referral  PLAN:  RNCM will refer patient to community case manager and pharmacist.  [name] RN,BSN,CCM [facility] Telephonic  [phone]

## 2016-02-19 NOTE — Patient Outreach (Signed)
New referral fro transition of care: New referral. Unknown discharge date. Placed call to patient. No answer. Left a message requesting a call back.  PLAN: Will continue to outreach patient. Rowe Pavy, RN, BSN, CEN [facility] [employer] [phone]

## 2016-02-20 ENCOUNTER — Other Ambulatory Visit: Payer: Self-pay

## 2016-02-20 DIAGNOSIS — R262 Difficulty in walking, not elsewhere classified: Secondary | ICD-10-CM | POA: Diagnosis not present

## 2016-02-20 DIAGNOSIS — M6281 Muscle weakness (generalized): Secondary | ICD-10-CM | POA: Diagnosis not present

## 2016-02-20 DIAGNOSIS — R293 Abnormal posture: Secondary | ICD-10-CM | POA: Diagnosis not present

## 2016-02-20 DIAGNOSIS — R600 Localized edema: Secondary | ICD-10-CM | POA: Diagnosis not present

## 2016-02-20 DIAGNOSIS — M25652 Stiffness of left hip, not elsewhere classified: Secondary | ICD-10-CM | POA: Diagnosis not present

## 2016-02-20 DIAGNOSIS — Z4789 Encounter for other orthopedic aftercare: Secondary | ICD-10-CM | POA: Diagnosis not present

## 2016-02-20 DIAGNOSIS — M25651 Stiffness of right hip, not elsewhere classified: Secondary | ICD-10-CM | POA: Diagnosis not present

## 2016-02-20 DIAGNOSIS — G549 Nerve root and plexus disorder, unspecified: Secondary | ICD-10-CM | POA: Diagnosis not present

## 2016-02-20 DIAGNOSIS — Z89611 Acquired absence of right leg above knee: Secondary | ICD-10-CM | POA: Diagnosis not present

## 2016-02-20 NOTE — Patient Outreach (Signed)
Transition of care: Discharged from [facility] on 02/06/2016  Initial transition of care completed by [name].  Placed call to patient and spoke with wife.  Wife reports that patient is doing well. He has his first physical therapy appointment planned for today. Wife reports that patient has all his medications. Reports that patient gets his INR checked every Friday.  Patient has cardiology follow up planned for 03/10/2016 for evaluation of " blockage behind heart"   PLAN: Offered home visit for 02/26/2016 and wife accepted. Provided my phone number as contact information.  Will complete full assessment on 02/26/2016. Will send MD barrier letter and this note.    [facility] CM Care Plan Problem One   Flowsheet Row Most Recent Value  Care Plan Problem One  Recent hospital admission for afib.  Role Documenting the Problem One  Care Management Coordinator  Care Plan for Problem One  Active  THN Long Term Goal (31-90 days)  Patient will report no readmission for the next 60 days related to atrial fib.  THN Long Term Goal Start Date  02/20/16  Interventions for Problem One Long Term Goal   reviewed importance of taking medications as prescribed. reviewed reasons to call MD. Home visit planned.  THN CM Short Term Goal #1 (0-30 days)  Patient will taking all medications as prescribed for the next 30 days.  THN CM Short Term Goal #1 Start Date  02/20/16  Interventions for Short Term Goal #1  reviewed importance of taking all medications as prescribed.   THN CM Short Term Goal #2 (0-30 days)  Patient will report attending hospital follow up appointment with MD in the next 2 weeks.   THN CM Short Term Goal #2 Start Date  02/20/16  Interventions for Short Term Goal #2  Encouraged patient to make an appointment for hospital follow up with MD.       Rowe Pavy, RN, BSN, CEN [facility] Coordinator [phone]

## 2016-02-22 DIAGNOSIS — Z7901 Long term (current) use of anticoagulants: Secondary | ICD-10-CM | POA: Diagnosis not present

## 2016-02-26 ENCOUNTER — Other Ambulatory Visit: Payer: Self-pay

## 2016-02-26 NOTE — Patient Outreach (Signed)
[facility]) Care Management   02/26/2016  [name] [name] 06-13-1940 [id number]  [name] [name] is an 76 y.o. male Initial home visit with patient and wife, [name] [name]. Subjective:  Patient reports that he has had a rough year. 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 Then a fall with injury to stump.  States seizure in August. Poor circulation diagnosed in September, then new diagnosis of atrial fib, chf and "blockage in heart in December of 2017.  Patient reports that he was told he need his left leg amputated as well. Patient reports this is his biggest concern. States that he is planning to have someone look at his home about renovations to make handicap accessible.   Patient reports that he is unable to use his prosthetic leg due to poor circulation of the left leg. Reports he has a wound on his left great toe that was draining but has now stopped draining. Reports pain in the left foot.  Objective:   Vitals:   02/26/16 1226  BP: 112/62  Pulse: 60  SpO2: 97%  Weight: 164 lb (74.4 kg)  Height: 1.676 m (5\' 6" )   Review of Systems  Constitutional: Negative.   HENT: Negative.   Eyes: Negative.   Respiratory: Negative.   Cardiovascular:       Reports that he can not tell when he is having problems with his a fib.  Gastrointestinal: Negative.   Genitourinary: Negative.   Musculoskeletal: Positive for joint pain.  Skin:       Reports wound the left great toe.  Neurological:       Reports occasional dizziness  Endo/Heme/Allergies: Bruises/bleeds easily.  Psychiatric/Behavioral: Positive for depression.    Physical Exam  Constitutional: He is oriented to person, place, and time. He appears well-developed and well-nourished.  Cardiovascular: Normal rate and normal heart sounds.   Irregular rhythm.  Unable to palpate pedal pulse. Wife states this is normal.  Foot warm and red to the touch. Dry skin noted.   Respiratory: Effort normal and breath sounds  normal.  Lungs clear  GI: Soft. Bowel sounds are normal.  Musculoskeletal: He exhibits edema.  Right above the knee amputation. Stump well healed.   Right foot swollen and red. Unable to palpate pulse. Foot warm to the touch.  Trace edema to the right lower leg and foot.  Neurological: He is alert and oriented to person, place, and time.  Skin: Skin is warm and dry.  See notes about right foot.  Psychiatric: He has a normal mood and affect. His behavior is normal. Judgment and thought content normal.  Appears to be in good spirits    Encounter Medications:   Outpatient Encounter Prescriptions as of 02/26/2016  Medication Sig  . aspirin EC 81 MG tablet Take 81 mg by mouth daily.  . cholecalciferol (VITAMIN D) 1000 UNITS tablet Take 1,000 Units by mouth 2 (two) times daily. Reported on 02/21/2015  . citalopram (CELEXA) 20 MG tablet daily.  . clopidogrel (PLAVIX) 75 MG tablet Take 75 mg by mouth daily.  . Cyanocobalamin (VITAMIN B 12 PO) Take 1,000 mg by mouth 2 (two) times daily. Reported on 02/21/2015  . furosemide (LASIX) 20 MG tablet Take 20 mg by mouth.  . gabapentin (NEURONTIN) 300 MG capsule Take 300 mg by mouth 3 (three) times daily.  Marland Kitchen HYDROcodone-acetaminophen (NORCO/VICODIN) 5-325 MG tablet Take 1 tablet by mouth every 4 (four) hours as needed for moderate pain.  Marland Kitchen lisinopril (PRINIVIL,ZESTRIL) 5 MG tablet Take  5 mg by mouth daily.  . metoprolol succinate (TOPROL-XL) 100 MG 24 hr tablet Take 100 mg by mouth daily. Reported on 03/20/2015  . Omega-3 Fatty Acids (FISH OIL) 1200 MG CPDR Take 2 capsules by mouth.  . ondansetron (ZOFRAN-ODT) 4 MG disintegrating tablet Take 4 mg by mouth every 4 (four) hours. As needed only  . rosuvastatin (CRESTOR) 20 MG tablet Take 20 mg by mouth daily.  . tamsulosin (FLOMAX) 0.4 MG CAPS capsule Take 0.4 mg by mouth. Reported on 02/21/2015  . warfarin (COUMADIN) 2 MG tablet Take 2 mg by mouth daily. Tues, Thursday, Sat.  . warfarin (COUMADIN) 4 MG  tablet Take 4 mg by mouth daily. Sun, Monday, Wednesday, friday  . acetaminophen (TYLENOL) 325 MG tablet Take 650 mg by mouth every 4 (four) hours as needed.  Marland Kitchen amLODipine (NORVASC) 2.5 MG tablet Take 2.5 mg by mouth daily.  Marland Kitchen aspirin 325 MG tablet Take 325 mg by mouth daily. Reported on 02/21/2015  . cholecalciferol (VITAMIN D) 400 units TABS tablet Take 1,000 tablets by mouth 1 day or 1 dose.  . cloNIDine (CATAPRES) 0.1 MG tablet Take 0.1 mg by mouth every 8 (eight) hours. Reported on 03/20/2015  . escitalopram (LEXAPRO) 10 MG tablet Take 10 mg by mouth daily. Reported on 02/21/2015  . ferrous sulfate 325 (65 FE) MG tablet Take 325 mg by mouth 2 (two) times daily after a meal.   . folic acid (FOLVITE) 1 MG tablet Take 1 mg by mouth daily.  . furosemide (LASIX) 40 MG tablet Take 40 mg by mouth.  Marland Kitchen HYDROmorphone (DILAUDID) 4 MG tablet Take 4 mg by mouth every 4 (four) hours as needed for severe pain.   . Multiple Vitamin (MULTIVITAMIN) tablet Take 1 tablet by mouth daily.  . Omega-3 Fatty Acids (FISH OIL) 1200 MG CAPS Take 1,200 mg by mouth 2 (two) times daily.  . ramipril (ALTACE) 10 MG capsule daily. Reported on 02/21/2015  . rivaroxaban (XARELTO) 20 MG TABS tablet Take 20 mg by mouth daily with supper.  . sennosides-docusate sodium (SENOKOT-S) 8.6-50 MG tablet Take 2 tablets by mouth daily.  Marland Kitchen spironolactone-hydrochlorothiazide (ALDACTAZIDE) 25-25 MG per tablet daily. Reported on 02/21/2015  . sulfamethoxazole-trimethoprim (BACTRIM DS,SEPTRA DS) 800-160 MG tablet Take 1 tablet by mouth 2 (two) times daily. Reported on 03/20/2015  . vitamin B-12 (CYANOCOBALAMIN) 1000 MCG tablet Take 1,000 mcg by mouth daily.   No facility-administered encounter medications on file as of 02/26/2016.     Functional Status:   In your present state of health, do you have any difficulty performing the following activities: 02/26/2016  Hearing? Y  Vision? Y  Difficulty concentrating or making decisions? Y  Walking  or climbing stairs? Y  Dressing or bathing? N  Doing errands, shopping? Y  Preparing Food and eating ? N  Using the Toilet? Y  In the past six months, have you accidently leaked urine? Y  Do you have problems with loss of bowel control? N  Managing your Medications? N  Managing your Finances? Y  Housekeeping or managing your Housekeeping? Y  Some recent data might be hidden    Fall/Depression Screening:    PHQ 2/9 Scores 02/26/2016 02/15/2016 02/21/2015 02/08/2015  PHQ - 2 Score 4 4 6 3   PHQ- 9 Score 13 13 14 7    Fall Risk  02/26/2016 02/21/2015 02/08/2015  Falls in the past year? Yes Yes No  Number falls in past yr: 2 or more 1 -  Injury with Fall? Yes  No -  Risk for fall due to : History of fall(s) Impaired mobility;Other (Comment) -  Risk for fall due to (comments): - mobility is improving. no assistive devices. active with therapy -  Follow up Falls prevention discussed Falls prevention discussed -   Assessment:   (1) Reviewed [facility] program. Provided new  HTA patient packet and new consent form.  (2) new onset of a fib.  (3) pending amputation of the right lower leg.  Unable to palpate pulses to the left leg.  Wife reports this is normal.  (4) Pending cardiology appointment on 03/11/2015. (5) pending home modifications. (6) uses motorized wheelchair in home for mobility. PT is working with patient on outpatient basis for ambulation with walker. (7) wife reports that she thinks advanced directives need updating.  Plan:  (1) consent scanned into Medical record. Copy provided to patient and wife. (2) Provided afib education to patient.  a fib packet, Emmi education.  Reviewed importance of recognition of symptoms like dizziness, weakness, and rapid heart rate. (3)reviewed reasons to call MD for increased pain, swelling and or drainage of foot. Wife states that they are aware of warning signs.  (4) reviewed importance of calling cardiology for any changes in condition.   (5) contractor in the home today to assess needs for improvement to make home handicap accessible. [employer] provided information about grants for home modification programs.  (6) encouraged patient to use fall precautions as much as possible and ambulate with walker as encouraged by physical therapist. Encouraged patient to increase his upper arm strength by doing home exercises with bands and weights for preparation of pending right leg amputation.  (7) Provided new advance directive packet and reviewed with wife how to complete.   Goal setting and care planning during home visit and primary goal is to avoid readmission.  Patient will receive weekly transition of care calls. Next call in 7 days. Encouraged wife to call me if needed. Provided my business card and 24 hour nurse line contact number.    [facility] CM Care Plan Problem One   Flowsheet Row Most Recent Value  Care Plan Problem One  Recent hospital admission for afib.  Role Documenting the Problem One  Care Management Coordinator  Care Plan for Problem One  Active  THN Long Term Goal (31-90 days)  Patient will report no readmission for the next 60 days related to atrial fib.  THN Long Term Goal Start Date  02/20/16  Interventions for Problem One Long Term Goal  home visit completed. Reviewed atrial fib and warning signs.   THN CM Short Term Goal #1 (0-30 days)  Patient will taking all medications as prescribed for the next 30 days.  THN CM Short Term Goal #1 Start Date  02/20/16  Interventions for Short Term Goal #1  Reviewed discharge medication sheet and all medications. Encouraged patient to take all medications as prescribed.   THN CM Short Term Goal #2 (0-30 days)  Patient will report attending hospital follow up appointment with MD in the next 2 weeks.   THN CM Short Term Goal #2 Start Date  02/20/16  Interventions for Short Term Goal #2  Encouraged wife to make a hospital follow up appointment.   THN CM Short Term Goal  #3 (0-30 days)  Patient will review educational materials provided about a fib in teh next 2 weeks.  THN CM Short Term Goal #3 Start Date  02/26/16  Interventions for Short Tern Goal #3  Provided Emmi  education about A fib, Provided printed [place] atrial fib booklet. reviewed reasons to call MD and EMS.. reviewed importance of anti coagulation to reduce risk of strokes and clots.      This note sent to MD.  Rowe Pavy, RN, BSN, CEN [employer] [facility] Coordinator [phone]

## 2016-02-29 DIAGNOSIS — Z7901 Long term (current) use of anticoagulants: Secondary | ICD-10-CM | POA: Diagnosis not present

## 2016-03-04 ENCOUNTER — Other Ambulatory Visit: Payer: Self-pay | Admitting: Pharmacist

## 2016-03-04 ENCOUNTER — Other Ambulatory Visit: Payer: Self-pay

## 2016-03-04 NOTE — Patient Outreach (Signed)
[facility]) Care Management  03/04/2016  [name] [name] [facility] 1940/10/23 [id number]  Patient was called regarding medication management per referral from [facility] RN, [name] [name].  Spoke with the patient's wife who is on [facility] consent and was able to provide HIPAA identifiers.  [name] [name] said she was interested in discussing the patient's medications but they were currently at the beach.  She asked that they be called next week when they have returned home for medication review.  Plan:  Call patient/spouse within the next 7-10 business days to complete medication review.  [name], PharmD, [facility] Clinical Pharmacist [facility] [phone]  Phone outreach made by [facility] PharmD, [name], whom does not have CHL access yet, I am assisting her with her documentation at this time.

## 2016-03-04 NOTE — Patient Outreach (Signed)
Transition of care: Placed call to patient who reports that he is ding well. Reports that he is at the beach.   Denies any new problems or concerns.  PLAN: Will continue weekly transition of care calls next week.  Rowe Pavy, RN, BSN, CEN [facility] [employer] [phone]

## 2016-03-09 DIAGNOSIS — Z89611 Acquired absence of right leg above knee: Secondary | ICD-10-CM | POA: Diagnosis not present

## 2016-03-09 DIAGNOSIS — I5020 Unspecified systolic (congestive) heart failure: Secondary | ICD-10-CM | POA: Diagnosis not present

## 2016-03-09 DIAGNOSIS — Z8673 Personal history of transient ischemic attack (TIA), and cerebral infarction without residual deficits: Secondary | ICD-10-CM | POA: Diagnosis not present

## 2016-03-09 DIAGNOSIS — I739 Peripheral vascular disease, unspecified: Secondary | ICD-10-CM | POA: Diagnosis not present

## 2016-03-09 DIAGNOSIS — R269 Unspecified abnormalities of gait and mobility: Secondary | ICD-10-CM | POA: Diagnosis not present

## 2016-03-10 DIAGNOSIS — Z7901 Long term (current) use of anticoagulants: Secondary | ICD-10-CM | POA: Diagnosis not present

## 2016-03-10 DIAGNOSIS — I214 Non-ST elevation (NSTEMI) myocardial infarction: Secondary | ICD-10-CM | POA: Diagnosis not present

## 2016-03-10 DIAGNOSIS — I10 Essential (primary) hypertension: Secondary | ICD-10-CM | POA: Diagnosis not present

## 2016-03-10 DIAGNOSIS — I481 Persistent atrial fibrillation: Secondary | ICD-10-CM | POA: Diagnosis not present

## 2016-03-11 ENCOUNTER — Other Ambulatory Visit: Payer: Self-pay

## 2016-03-11 ENCOUNTER — Ambulatory Visit: Payer: Self-pay | Admitting: Pharmacist

## 2016-03-11 ENCOUNTER — Ambulatory Visit: Payer: PPO

## 2016-03-11 ENCOUNTER — Other Ambulatory Visit: Payer: Self-pay | Admitting: Pharmacist

## 2016-03-11 NOTE — Patient Outreach (Signed)
[facility]) Care Management  [facility]   03/11/2016  April Tomac [facility] Sep 06, 1940 [id number]  Subjective:   Patient was recalled and I was able to speak with his wife ([name] Finau) regarding medication management, on consent form, HIPAA verified.  Mr. Edward is a 76 year old male with multiple medical conditions including but not limited to:  Hypertension, peripheral vascular disease s/p right leg AKA, atrial fibrillation, hyperlipidemia and history of CVA. He was hospitalized at [facility], 01/30/16 for atrial fibrillation and altered mental status.  Mrs. Matheny reports there is not an issue paying for Mr. Noun medications as most of them are generic.   Mrs. Loveday declined a pharmacy home visit.   Objective:   Current Medications: Current Outpatient Prescriptions  Medication Sig Dispense Refill  . acetaminophen (TYLENOL) 325 MG tablet Take 650 mg by mouth every 4 (four) hours as needed.    Marland Kitchen aspirin EC 81 MG tablet Take 81 mg by mouth daily.    . cholecalciferol (VITAMIN D) 1000 UNITS tablet Take 1,000 Units by mouth 2 (two) times daily. Reported on 02/21/2015    . citalopram (CELEXA) 20 MG tablet daily.    . clopidogrel (PLAVIX) 75 MG tablet Take 75 mg by mouth daily.    . Cyanocobalamin (VITAMIN B-12) 5000 MCG TBDP Take 1 tablet by mouth daily.    . ferrous sulfate 325 (65 FE) MG tablet Take 325 mg by mouth 2 (two) times daily after a meal.     . folic acid (FOLVITE) 1 MG tablet Take 1 mg by mouth daily.    . furosemide (LASIX) 20 MG tablet Take 20 mg by mouth.    . gabapentin (NEURONTIN) 300 MG capsule Take 300 mg by mouth 3 (three) times daily.    Marland Kitchen HYDROcodone-acetaminophen (NORCO/VICODIN) 5-325 MG tablet Take 1 tablet by mouth every 4 (four) hours as needed for moderate pain.    Marland Kitchen HYDROmorphone (DILAUDID) 2 MG tablet Take 2 mg by mouth every 4 (four) hours as needed for severe pain.     Marland Kitchen lisinopril  (PRINIVIL,ZESTRIL) 5 MG tablet Take 5 mg by mouth daily.    . metoprolol succinate (TOPROL-XL) 100 MG 24 hr tablet Take 100 mg by mouth daily. Reported on 03/20/2015    . Multiple Vitamin (MULTIVITAMIN) tablet Take 1 tablet by mouth daily.    . Omega-3 Fatty Acids (FISH OIL) 1200 MG CPDR Take 2 capsules by mouth.    . ondansetron (ZOFRAN) 4 MG tablet Take 4 mg by mouth every 8 (eight) hours as needed for nausea/vomiting.    . rosuvastatin (CRESTOR) 20 MG tablet Take 20 mg by mouth daily.    . sennosides-docusate sodium (SENOKOT-S) 8.6-50 MG tablet Take 2 tablets by mouth daily.    . tamsulosin (FLOMAX) 0.4 MG CAPS capsule Take 0.4 mg by mouth. Reported on 02/21/2015    . warfarin (COUMADIN) 2 MG tablet Take 2 mg by mouth daily. Take 2 tablets daily on Sun, Monday, Wednesday & Friday and 1 tablet daily on Tues, Thursday and Saturday     No current facility-administered medications for this visit.     Functional Status: In your present state of health, do you have any difficulty performing the following activities: 02/26/2016  Hearing? Y  Vision? Y  Difficulty concentrating or making decisions? Y  Walking or climbing stairs? Y  Dressing or bathing? N  Doing errands, shopping? Y  Preparing Food and eating ? N  Using  the Toilet? Y  In the past six months, have you accidently leaked urine? Y  Do you have problems with loss of bowel control? N  Managing your Medications? N  Managing your Finances? Y  Housekeeping or managing your Housekeeping? Y  Some recent data might be hidden    Fall/Depression Screening: PHQ 2/9 Scores 02/26/2016 02/15/2016 02/21/2015 02/08/2015  PHQ - 2 Score 4 4 6 3   PHQ- 9 Score 13 13 14 7     Assessment:  Patient's medications were reconciled by comparing the [facility] discharge summary, the medication list from the most recent [facility] medication list, the medication list in the patient's chart and via telephone with the patient's wife.  Drugs sorted by  system:  Neurologic/Psychologic: Citalopram  Cardiovascular: Clopidogrel Furosemide Lisinopril Metoprolol succinate Aspirin Rosuvastatin Warfarin Fish Oil  Gastrointestinal: Sennosides-Docusate (only uses PRN) Ondansetron  Pain: Hydrocodone/Acetaminophen Hydromorphone Gabapentin Acetaminophen (prn only but not currently taking since is on Norco)  Vitamins/Minerals: Ferrous Sulfate Folic Acid Multiple Vitamin Vitamin D  Vitamin B 12  Miscellaneous: Tamsulosin  Medication List Discrepancies:  1.  The following medications were removed from the medication list as it was confirmed the patient is not taking:  -Escitalopram 10 mg (patient is taking citalopram) -Asprin 325 mg (patient is on Aspirin 81 mg) -amlodipine 2.5 mg (not taking) -clonidine 0.1 mg (not taking) -furosemide 40 mg (is taking furosemide 20 mg) -cyanocobalamin 1000 mcg (is taking cyanocobalamin 5000 mcg) -ramipril 10 mg (is not taking) -rivaroxaban 20mg  (is on warfarin) -spironolactone-hydrochlorothiazide 25/25 mg (is not taking) -Sulfamethoxazole-trimethoprim 800/160 mg (completed  therapy) -warfarin 4mg  (is taking warfarin 2 mg)  Drug interactions: Increased risk of bleeding with aspirin, warfarin, clopidogrel and citalopram.  Suggest close monitoring of patient for signs/symptoms of bleeding.    Plan:  1. Route note to PCP.  2. Close pharmacy case as patient declined pharmacy services and did not have any additional medication questions or concerns.  Patient/spouse confirmed they have [facility] phone number, happy to assist if new pharmacy issues arise in the future.    [name], PharmD, [facility] Clinical Pharmacist [facility] [phone]  Phone outreach was completed by [facility] PharmD [name], whom does not have CHL access and I am assisting her with her documentation.  Patient case was discussed between myself and [place].

## 2016-03-11 NOTE — Patient Outreach (Signed)
Transition of care: Placed call to patient for transition of care.  No answer.   PLAN: left a message requesting a call back. Will continue to outreach weekly for transition of care.  Rowe Pavy, RN, BSN, CEN [facility] [employer] [phone]

## 2016-03-11 NOTE — Patient Outreach (Signed)
[facility]) Care Management  03/11/2016  [name] [name] [facility] 1940-07-26 [id number]  Patient was called regarding medication assistance and management. HIPAA identifiers were obtained.  Patient requested I call back and speak with his wife as she manages his medications.    Plan: I will call the patient and his wife back later this afternoon.   Patient outreach was completed by [facility] Pharmacist, Nunzio Cobbs.  I am helping her with documentation as she does not have access.  [name], PharmD, [facility] Clinical Pharmacist [facility] [phone]

## 2016-03-17 DIAGNOSIS — Z7982 Long term (current) use of aspirin: Secondary | ICD-10-CM | POA: Diagnosis not present

## 2016-03-17 DIAGNOSIS — I420 Dilated cardiomyopathy: Secondary | ICD-10-CM | POA: Diagnosis not present

## 2016-03-17 DIAGNOSIS — Z7901 Long term (current) use of anticoagulants: Secondary | ICD-10-CM | POA: Diagnosis not present

## 2016-03-17 DIAGNOSIS — Z888 Allergy status to other drugs, medicaments and biological substances status: Secondary | ICD-10-CM | POA: Diagnosis not present

## 2016-03-17 DIAGNOSIS — I255 Ischemic cardiomyopathy: Secondary | ICD-10-CM | POA: Diagnosis not present

## 2016-03-17 DIAGNOSIS — I2510 Atherosclerotic heart disease of native coronary artery without angina pectoris: Secondary | ICD-10-CM | POA: Diagnosis not present

## 2016-03-17 DIAGNOSIS — I4891 Unspecified atrial fibrillation: Secondary | ICD-10-CM | POA: Diagnosis not present

## 2016-03-17 DIAGNOSIS — Z885 Allergy status to narcotic agent status: Secondary | ICD-10-CM | POA: Diagnosis not present

## 2016-03-17 DIAGNOSIS — Z79899 Other long term (current) drug therapy: Secondary | ICD-10-CM | POA: Diagnosis not present

## 2016-03-17 DIAGNOSIS — I739 Peripheral vascular disease, unspecified: Secondary | ICD-10-CM | POA: Diagnosis not present

## 2016-03-17 DIAGNOSIS — Z87891 Personal history of nicotine dependence: Secondary | ICD-10-CM | POA: Diagnosis not present

## 2016-03-17 DIAGNOSIS — Z89511 Acquired absence of right leg below knee: Secondary | ICD-10-CM | POA: Diagnosis not present

## 2016-03-18 ENCOUNTER — Other Ambulatory Visit: Payer: Self-pay

## 2016-03-18 DIAGNOSIS — R2681 Unsteadiness on feet: Secondary | ICD-10-CM | POA: Diagnosis not present

## 2016-03-18 DIAGNOSIS — M25652 Stiffness of left hip, not elsewhere classified: Secondary | ICD-10-CM | POA: Diagnosis not present

## 2016-03-18 DIAGNOSIS — I481 Persistent atrial fibrillation: Secondary | ICD-10-CM | POA: Diagnosis not present

## 2016-03-18 DIAGNOSIS — R600 Localized edema: Secondary | ICD-10-CM | POA: Diagnosis not present

## 2016-03-18 DIAGNOSIS — I214 Non-ST elevation (NSTEMI) myocardial infarction: Secondary | ICD-10-CM | POA: Diagnosis not present

## 2016-03-18 DIAGNOSIS — M25651 Stiffness of right hip, not elsewhere classified: Secondary | ICD-10-CM | POA: Diagnosis not present

## 2016-03-18 DIAGNOSIS — Z89611 Acquired absence of right leg above knee: Secondary | ICD-10-CM | POA: Diagnosis not present

## 2016-03-18 DIAGNOSIS — Z4789 Encounter for other orthopedic aftercare: Secondary | ICD-10-CM | POA: Diagnosis not present

## 2016-03-18 DIAGNOSIS — M6281 Muscle weakness (generalized): Secondary | ICD-10-CM | POA: Diagnosis not present

## 2016-03-18 DIAGNOSIS — I10 Essential (primary) hypertension: Secondary | ICD-10-CM | POA: Diagnosis not present

## 2016-03-18 DIAGNOSIS — G546 Phantom limb syndrome with pain: Secondary | ICD-10-CM | POA: Diagnosis not present

## 2016-03-18 DIAGNOSIS — Z7901 Long term (current) use of anticoagulants: Secondary | ICD-10-CM | POA: Diagnosis not present

## 2016-03-18 DIAGNOSIS — R293 Abnormal posture: Secondary | ICD-10-CM | POA: Diagnosis not present

## 2016-03-18 NOTE — Patient Outreach (Signed)
Transition of care call:  Placed call to patient and spoke with wife. Wife reports patient is about the same. Reports patient saw cardiologist at [facility] yesterday and was scheduled for a cardiac cath.   Wife reports patient continues to have pain in the foot and leg.    Wife states that they are considering home modifications and trying to figure out a plan for when other leg has to amputated.   PLAN: reviewed with wife one step at a time.  Will plan to continue transition of care calls weekly for one more week.  Rowe Pavy, RN, BSN, CEN [facility] [employer] [phone]

## 2016-03-25 ENCOUNTER — Other Ambulatory Visit: Payer: Self-pay

## 2016-03-25 ENCOUNTER — Ambulatory Visit: Payer: Self-pay

## 2016-03-25 DIAGNOSIS — M961 Postlaminectomy syndrome, not elsewhere classified: Secondary | ICD-10-CM | POA: Diagnosis not present

## 2016-03-25 DIAGNOSIS — G894 Chronic pain syndrome: Secondary | ICD-10-CM | POA: Diagnosis not present

## 2016-03-25 DIAGNOSIS — Z8673 Personal history of transient ischemic attack (TIA), and cerebral infarction without residual deficits: Secondary | ICD-10-CM | POA: Diagnosis not present

## 2016-03-25 DIAGNOSIS — G629 Polyneuropathy, unspecified: Secondary | ICD-10-CM | POA: Diagnosis not present

## 2016-03-25 DIAGNOSIS — T8789 Other complications of amputation stump: Secondary | ICD-10-CM | POA: Diagnosis not present

## 2016-03-25 DIAGNOSIS — I739 Peripheral vascular disease, unspecified: Secondary | ICD-10-CM | POA: Diagnosis not present

## 2016-03-25 DIAGNOSIS — M5136 Other intervertebral disc degeneration, lumbar region: Secondary | ICD-10-CM | POA: Diagnosis not present

## 2016-03-25 DIAGNOSIS — G547 Phantom limb syndrome without pain: Secondary | ICD-10-CM | POA: Diagnosis not present

## 2016-03-25 NOTE — Patient Outreach (Signed)
Final transition of care call: Placed call to patient for follow up. Patient reports that he is going to the pain center on [address] in [place].  Reports that he is having a cardiac cath tomorrow at [facility].  States that he has no new problems or concerns.  PLAN: Patient has successfully completed transition of care program. Will continue to follow for another 30 days. Encouraged patient to call me if needed.   Rowe Pavy, RN, BSN, CEN [facility] [employer] [phone]

## 2016-03-26 DIAGNOSIS — Z01810 Encounter for preprocedural cardiovascular examination: Secondary | ICD-10-CM | POA: Diagnosis not present

## 2016-03-26 DIAGNOSIS — I5021 Acute systolic (congestive) heart failure: Secondary | ICD-10-CM | POA: Diagnosis not present

## 2016-03-26 DIAGNOSIS — I4891 Unspecified atrial fibrillation: Secondary | ICD-10-CM | POA: Diagnosis not present

## 2016-03-26 DIAGNOSIS — Z955 Presence of coronary angioplasty implant and graft: Secondary | ICD-10-CM | POA: Diagnosis not present

## 2016-03-26 DIAGNOSIS — Z8679 Personal history of other diseases of the circulatory system: Secondary | ICD-10-CM | POA: Diagnosis not present

## 2016-03-26 DIAGNOSIS — R002 Palpitations: Secondary | ICD-10-CM | POA: Diagnosis not present

## 2016-03-26 DIAGNOSIS — I429 Cardiomyopathy, unspecified: Secondary | ICD-10-CM

## 2016-03-26 DIAGNOSIS — Z7901 Long term (current) use of anticoagulants: Secondary | ICD-10-CM | POA: Diagnosis not present

## 2016-03-26 DIAGNOSIS — Z87891 Personal history of nicotine dependence: Secondary | ICD-10-CM | POA: Diagnosis not present

## 2016-03-26 DIAGNOSIS — I2584 Coronary atherosclerosis due to calcified coronary lesion: Secondary | ICD-10-CM | POA: Diagnosis not present

## 2016-03-26 DIAGNOSIS — I2582 Chronic total occlusion of coronary artery: Secondary | ICD-10-CM | POA: Diagnosis not present

## 2016-03-26 DIAGNOSIS — I420 Dilated cardiomyopathy: Secondary | ICD-10-CM | POA: Diagnosis not present

## 2016-03-26 DIAGNOSIS — I2510 Atherosclerotic heart disease of native coronary artery without angina pectoris: Secondary | ICD-10-CM | POA: Diagnosis not present

## 2016-03-26 DIAGNOSIS — I255 Ischemic cardiomyopathy: Secondary | ICD-10-CM | POA: Diagnosis not present

## 2016-03-26 HISTORY — DX: Cardiomyopathy, unspecified: I42.9

## 2016-03-27 DIAGNOSIS — R079 Chest pain, unspecified: Secondary | ICD-10-CM | POA: Diagnosis not present

## 2016-04-02 DIAGNOSIS — Z89611 Acquired absence of right leg above knee: Secondary | ICD-10-CM | POA: Diagnosis not present

## 2016-04-02 DIAGNOSIS — Z4789 Encounter for other orthopedic aftercare: Secondary | ICD-10-CM | POA: Diagnosis not present

## 2016-04-02 DIAGNOSIS — R262 Difficulty in walking, not elsewhere classified: Secondary | ICD-10-CM | POA: Diagnosis not present

## 2016-04-02 DIAGNOSIS — R293 Abnormal posture: Secondary | ICD-10-CM | POA: Diagnosis not present

## 2016-04-02 DIAGNOSIS — M6281 Muscle weakness (generalized): Secondary | ICD-10-CM | POA: Diagnosis not present

## 2016-04-02 DIAGNOSIS — M25651 Stiffness of right hip, not elsewhere classified: Secondary | ICD-10-CM | POA: Diagnosis not present

## 2016-04-02 DIAGNOSIS — M25652 Stiffness of left hip, not elsewhere classified: Secondary | ICD-10-CM | POA: Diagnosis not present

## 2016-04-02 DIAGNOSIS — G546 Phantom limb syndrome with pain: Secondary | ICD-10-CM | POA: Diagnosis not present

## 2016-04-02 DIAGNOSIS — R600 Localized edema: Secondary | ICD-10-CM | POA: Diagnosis not present

## 2016-04-04 DIAGNOSIS — M25651 Stiffness of right hip, not elsewhere classified: Secondary | ICD-10-CM | POA: Diagnosis not present

## 2016-04-04 DIAGNOSIS — Z4789 Encounter for other orthopedic aftercare: Secondary | ICD-10-CM | POA: Diagnosis not present

## 2016-04-04 DIAGNOSIS — R293 Abnormal posture: Secondary | ICD-10-CM | POA: Diagnosis not present

## 2016-04-04 DIAGNOSIS — M6281 Muscle weakness (generalized): Secondary | ICD-10-CM | POA: Diagnosis not present

## 2016-04-04 DIAGNOSIS — G546 Phantom limb syndrome with pain: Secondary | ICD-10-CM | POA: Diagnosis not present

## 2016-04-04 DIAGNOSIS — R600 Localized edema: Secondary | ICD-10-CM | POA: Diagnosis not present

## 2016-04-04 DIAGNOSIS — Z89611 Acquired absence of right leg above knee: Secondary | ICD-10-CM | POA: Diagnosis not present

## 2016-04-04 DIAGNOSIS — M25652 Stiffness of left hip, not elsewhere classified: Secondary | ICD-10-CM | POA: Diagnosis not present

## 2016-04-04 DIAGNOSIS — R262 Difficulty in walking, not elsewhere classified: Secondary | ICD-10-CM | POA: Diagnosis not present

## 2016-04-09 DIAGNOSIS — I5020 Unspecified systolic (congestive) heart failure: Secondary | ICD-10-CM | POA: Diagnosis not present

## 2016-04-09 DIAGNOSIS — Z8673 Personal history of transient ischemic attack (TIA), and cerebral infarction without residual deficits: Secondary | ICD-10-CM | POA: Diagnosis not present

## 2016-04-09 DIAGNOSIS — Z89611 Acquired absence of right leg above knee: Secondary | ICD-10-CM | POA: Diagnosis not present

## 2016-04-09 DIAGNOSIS — R269 Unspecified abnormalities of gait and mobility: Secondary | ICD-10-CM | POA: Diagnosis not present

## 2016-04-09 DIAGNOSIS — I739 Peripheral vascular disease, unspecified: Secondary | ICD-10-CM | POA: Diagnosis not present

## 2016-04-15 DIAGNOSIS — R002 Palpitations: Secondary | ICD-10-CM | POA: Diagnosis not present

## 2016-04-21 DIAGNOSIS — M25475 Effusion, left foot: Secondary | ICD-10-CM | POA: Diagnosis not present

## 2016-04-22 ENCOUNTER — Other Ambulatory Visit: Payer: Self-pay

## 2016-04-22 DIAGNOSIS — G547 Phantom limb syndrome without pain: Secondary | ICD-10-CM | POA: Diagnosis not present

## 2016-04-22 DIAGNOSIS — I739 Peripheral vascular disease, unspecified: Secondary | ICD-10-CM | POA: Diagnosis not present

## 2016-04-22 DIAGNOSIS — G629 Polyneuropathy, unspecified: Secondary | ICD-10-CM | POA: Diagnosis not present

## 2016-04-22 DIAGNOSIS — G894 Chronic pain syndrome: Secondary | ICD-10-CM | POA: Diagnosis not present

## 2016-04-22 DIAGNOSIS — M5136 Other intervertebral disc degeneration, lumbar region: Secondary | ICD-10-CM | POA: Diagnosis not present

## 2016-04-22 DIAGNOSIS — M961 Postlaminectomy syndrome, not elsewhere classified: Secondary | ICD-10-CM | POA: Diagnosis not present

## 2016-04-22 DIAGNOSIS — T8789 Other complications of amputation stump: Secondary | ICD-10-CM | POA: Diagnosis not present

## 2016-04-22 DIAGNOSIS — Z8673 Personal history of transient ischemic attack (TIA), and cerebral infarction without residual deficits: Secondary | ICD-10-CM | POA: Diagnosis not present

## 2016-04-22 NOTE — Patient Outreach (Signed)
Telephone assessment: Placed call to patient and spoke with wife. Wife provided update that patient's cardiac cath went well. States patient is doing well with the pain management group. Wife states that patient saw primary MD yesterday when patient developed increase pain, swelling, and discoloration to low leg.  Wife reports that Dr. Donzetta Sprung office is calling to get appointment with Dr. Shaune Pollack the vascular doctor.  Wife reports her only concern is home modification that will be needed when patient is a double amputee.  Wife reports that they are getting currently getting quotes.   Wife reports everything else is going well. We reviewed patient completion of 60 days without a readmission..  Reviewed with wife if she needs help with care coordination to please call me. We agreed to keep case open for another 2 weeks with this new development of altered circulation.   Rowe Pavy, RN, BSN, CEN [facility] [employer] [phone]

## 2016-04-23 DIAGNOSIS — I779 Disorder of arteries and arterioles, unspecified: Secondary | ICD-10-CM | POA: Diagnosis not present

## 2016-04-29 DIAGNOSIS — D649 Anemia, unspecified: Secondary | ICD-10-CM | POA: Diagnosis not present

## 2016-04-29 DIAGNOSIS — W06XXXA Fall from bed, initial encounter: Secondary | ICD-10-CM | POA: Diagnosis not present

## 2016-04-29 DIAGNOSIS — Z7409 Other reduced mobility: Secondary | ICD-10-CM | POA: Diagnosis not present

## 2016-04-29 DIAGNOSIS — Z9862 Peripheral vascular angioplasty status: Secondary | ICD-10-CM | POA: Diagnosis not present

## 2016-04-29 DIAGNOSIS — N400 Enlarged prostate without lower urinary tract symptoms: Secondary | ICD-10-CM | POA: Diagnosis not present

## 2016-04-29 DIAGNOSIS — I252 Old myocardial infarction: Secondary | ICD-10-CM | POA: Diagnosis not present

## 2016-04-29 DIAGNOSIS — Z89612 Acquired absence of left leg above knee: Secondary | ICD-10-CM | POA: Diagnosis not present

## 2016-04-29 DIAGNOSIS — Z978 Presence of other specified devices: Secondary | ICD-10-CM | POA: Diagnosis not present

## 2016-04-29 DIAGNOSIS — G4733 Obstructive sleep apnea (adult) (pediatric): Secondary | ICD-10-CM | POA: Diagnosis not present

## 2016-04-29 DIAGNOSIS — R4182 Altered mental status, unspecified: Secondary | ICD-10-CM | POA: Diagnosis not present

## 2016-04-29 DIAGNOSIS — E785 Hyperlipidemia, unspecified: Secondary | ICD-10-CM | POA: Diagnosis not present

## 2016-04-29 DIAGNOSIS — Z885 Allergy status to narcotic agent status: Secondary | ICD-10-CM | POA: Diagnosis not present

## 2016-04-29 DIAGNOSIS — Z89611 Acquired absence of right leg above knee: Secondary | ICD-10-CM | POA: Diagnosis not present

## 2016-04-29 DIAGNOSIS — G629 Polyneuropathy, unspecified: Secondary | ICD-10-CM | POA: Diagnosis not present

## 2016-04-29 DIAGNOSIS — Z888 Allergy status to other drugs, medicaments and biological substances status: Secondary | ICD-10-CM | POA: Diagnosis not present

## 2016-04-29 DIAGNOSIS — I779 Disorder of arteries and arterioles, unspecified: Secondary | ICD-10-CM

## 2016-04-29 DIAGNOSIS — I5022 Chronic systolic (congestive) heart failure: Secondary | ICD-10-CM | POA: Diagnosis not present

## 2016-04-29 DIAGNOSIS — Z7901 Long term (current) use of anticoagulants: Secondary | ICD-10-CM | POA: Diagnosis not present

## 2016-04-29 DIAGNOSIS — M8580 Other specified disorders of bone density and structure, unspecified site: Secondary | ICD-10-CM | POA: Diagnosis not present

## 2016-04-29 DIAGNOSIS — G8918 Other acute postprocedural pain: Secondary | ICD-10-CM | POA: Diagnosis not present

## 2016-04-29 DIAGNOSIS — Z9181 History of falling: Secondary | ICD-10-CM | POA: Diagnosis not present

## 2016-04-29 DIAGNOSIS — I119 Hypertensive heart disease without heart failure: Secondary | ICD-10-CM | POA: Diagnosis not present

## 2016-04-29 DIAGNOSIS — Y92230 Patient room in hospital as the place of occurrence of the external cause: Secondary | ICD-10-CM | POA: Diagnosis not present

## 2016-04-29 DIAGNOSIS — I739 Peripheral vascular disease, unspecified: Secondary | ICD-10-CM | POA: Diagnosis not present

## 2016-04-29 DIAGNOSIS — I480 Paroxysmal atrial fibrillation: Secondary | ICD-10-CM | POA: Diagnosis not present

## 2016-04-29 DIAGNOSIS — M533 Sacrococcygeal disorders, not elsewhere classified: Secondary | ICD-10-CM | POA: Diagnosis not present

## 2016-04-29 DIAGNOSIS — D696 Thrombocytopenia, unspecified: Secondary | ICD-10-CM | POA: Diagnosis not present

## 2016-04-29 DIAGNOSIS — Z8679 Personal history of other diseases of the circulatory system: Secondary | ICD-10-CM

## 2016-04-29 DIAGNOSIS — M545 Low back pain: Secondary | ICD-10-CM | POA: Diagnosis not present

## 2016-04-29 DIAGNOSIS — M160 Bilateral primary osteoarthritis of hip: Secondary | ICD-10-CM | POA: Diagnosis not present

## 2016-04-29 DIAGNOSIS — I70222 Atherosclerosis of native arteries of extremities with rest pain, left leg: Secondary | ICD-10-CM | POA: Diagnosis not present

## 2016-04-29 DIAGNOSIS — Z87891 Personal history of nicotine dependence: Secondary | ICD-10-CM | POA: Diagnosis not present

## 2016-04-29 DIAGNOSIS — R569 Unspecified convulsions: Secondary | ICD-10-CM | POA: Diagnosis not present

## 2016-04-29 DIAGNOSIS — M79605 Pain in left leg: Secondary | ICD-10-CM | POA: Diagnosis not present

## 2016-04-29 DIAGNOSIS — I420 Dilated cardiomyopathy: Secondary | ICD-10-CM | POA: Diagnosis not present

## 2016-04-29 DIAGNOSIS — I110 Hypertensive heart disease with heart failure: Secondary | ICD-10-CM | POA: Diagnosis not present

## 2016-04-29 DIAGNOSIS — G8929 Other chronic pain: Secondary | ICD-10-CM | POA: Diagnosis not present

## 2016-04-29 DIAGNOSIS — Z4781 Encounter for orthopedic aftercare following surgical amputation: Secondary | ICD-10-CM | POA: Diagnosis not present

## 2016-04-29 DIAGNOSIS — R269 Unspecified abnormalities of gait and mobility: Secondary | ICD-10-CM | POA: Diagnosis not present

## 2016-04-29 DIAGNOSIS — Z7902 Long term (current) use of antithrombotics/antiplatelets: Secondary | ICD-10-CM | POA: Diagnosis not present

## 2016-04-29 DIAGNOSIS — G894 Chronic pain syndrome: Secondary | ICD-10-CM | POA: Diagnosis not present

## 2016-04-29 DIAGNOSIS — R338 Other retention of urine: Secondary | ICD-10-CM | POA: Diagnosis not present

## 2016-04-29 DIAGNOSIS — I639 Cerebral infarction, unspecified: Secondary | ICD-10-CM | POA: Insufficient documentation

## 2016-04-29 DIAGNOSIS — I5020 Unspecified systolic (congestive) heart failure: Secondary | ICD-10-CM | POA: Diagnosis not present

## 2016-04-29 DIAGNOSIS — I70223 Atherosclerosis of native arteries of extremities with rest pain, bilateral legs: Secondary | ICD-10-CM | POA: Diagnosis not present

## 2016-04-29 DIAGNOSIS — G546 Phantom limb syndrome with pain: Secondary | ICD-10-CM | POA: Diagnosis not present

## 2016-04-29 DIAGNOSIS — E7800 Pure hypercholesterolemia, unspecified: Secondary | ICD-10-CM | POA: Diagnosis not present

## 2016-04-29 DIAGNOSIS — I255 Ischemic cardiomyopathy: Secondary | ICD-10-CM | POA: Diagnosis not present

## 2016-04-29 DIAGNOSIS — I2510 Atherosclerotic heart disease of native coronary artery without angina pectoris: Secondary | ICD-10-CM | POA: Diagnosis not present

## 2016-04-29 DIAGNOSIS — Z79899 Other long term (current) drug therapy: Secondary | ICD-10-CM | POA: Diagnosis not present

## 2016-04-29 DIAGNOSIS — D7582 Heparin induced thrombocytopenia (HIT): Secondary | ICD-10-CM

## 2016-04-29 DIAGNOSIS — I4891 Unspecified atrial fibrillation: Secondary | ICD-10-CM | POA: Diagnosis not present

## 2016-04-29 DIAGNOSIS — Z7982 Long term (current) use of aspirin: Secondary | ICD-10-CM | POA: Diagnosis not present

## 2016-04-29 DIAGNOSIS — I10 Essential (primary) hypertension: Secondary | ICD-10-CM | POA: Diagnosis not present

## 2016-04-29 DIAGNOSIS — I951 Orthostatic hypotension: Secondary | ICD-10-CM | POA: Diagnosis not present

## 2016-04-29 DIAGNOSIS — I959 Hypotension, unspecified: Secondary | ICD-10-CM | POA: Diagnosis not present

## 2016-04-29 DIAGNOSIS — D75829 Heparin-induced thrombocytopenia, unspecified: Secondary | ICD-10-CM

## 2016-04-29 DIAGNOSIS — Z8673 Personal history of transient ischemic attack (TIA), and cerebral infarction without residual deficits: Secondary | ICD-10-CM | POA: Diagnosis not present

## 2016-04-29 HISTORY — DX: Heparin-induced thrombocytopenia, unspecified: D75.829

## 2016-04-29 HISTORY — DX: Heparin induced thrombocytopenia (HIT): D75.82

## 2016-04-29 HISTORY — DX: Disorder of arteries and arterioles, unspecified: I77.9

## 2016-04-29 HISTORY — DX: Personal history of other diseases of the circulatory system: Z86.79

## 2016-05-05 ENCOUNTER — Other Ambulatory Visit: Payer: Self-pay

## 2016-05-05 NOTE — Patient Outreach (Signed)
Care coordination: Placed call to patient and spoke with wife who states that patient had his leg amputated on 05/02/2016.  Wife reports that patient started physical therapy today. Wife states patient is in good spirits.  Wife states that she can't talk as a doctor just walked in.  Patient is at [facility].  PLAN: told wife that I would call her back in 1 week. She agreed to phone call.  Rowe Pavy, RN, BSN, CEN [facility] [employer] [phone]

## 2016-05-06 ENCOUNTER — Ambulatory Visit: Payer: Self-pay

## 2016-05-12 ENCOUNTER — Other Ambulatory Visit: Payer: Self-pay

## 2016-05-12 DIAGNOSIS — Z89612 Acquired absence of left leg above knee: Secondary | ICD-10-CM

## 2016-05-12 DIAGNOSIS — Z789 Other specified health status: Secondary | ICD-10-CM | POA: Insufficient documentation

## 2016-05-12 DIAGNOSIS — Z7409 Other reduced mobility: Secondary | ICD-10-CM

## 2016-05-12 HISTORY — DX: Other reduced mobility: Z74.09

## 2016-05-12 HISTORY — DX: Other specified health status: Z78.9

## 2016-05-12 HISTORY — DX: Acquired absence of left leg above knee: Z89.612

## 2016-05-12 NOTE — Patient Outreach (Signed)
Telephone call: Placed follow up call to wife, [name].  Mrs. Bechen states that patient remains at [facility]. Patient plans to transfer to inpatient rehab at [facility] later today.  Wife reports that patient had a fall out of bed while in the hospital due to confusion.  Wife reports physically patient is doing well but having a hard time adjusting to missing both legs.    Home modificatioin: Wife reports that she has someone working on the their home bathroom to make bathroom wheelchair accessible .  Wife reports that she does not know how long patient will be at rehab.  Wife states that insurance has approved the first 7 days.  PLAN: will follow up with wife in 1 week.  Encouraged wife to call sooner if needed.   Rowe Pavy, RN, BSN, CEN [facility] [employer] [phone]

## 2016-05-13 DIAGNOSIS — I779 Disorder of arteries and arterioles, unspecified: Secondary | ICD-10-CM | POA: Diagnosis not present

## 2016-05-13 DIAGNOSIS — Z89612 Acquired absence of left leg above knee: Secondary | ICD-10-CM | POA: Diagnosis not present

## 2016-05-13 DIAGNOSIS — Z7409 Other reduced mobility: Secondary | ICD-10-CM | POA: Diagnosis not present

## 2016-05-13 DIAGNOSIS — I739 Peripheral vascular disease, unspecified: Secondary | ICD-10-CM | POA: Diagnosis not present

## 2016-05-13 DIAGNOSIS — I951 Orthostatic hypotension: Secondary | ICD-10-CM | POA: Diagnosis not present

## 2016-05-13 DIAGNOSIS — G8918 Other acute postprocedural pain: Secondary | ICD-10-CM | POA: Diagnosis not present

## 2016-05-13 DIAGNOSIS — Z89611 Acquired absence of right leg above knee: Secondary | ICD-10-CM | POA: Diagnosis not present

## 2016-05-13 DIAGNOSIS — I2510 Atherosclerotic heart disease of native coronary artery without angina pectoris: Secondary | ICD-10-CM | POA: Diagnosis not present

## 2016-05-13 DIAGNOSIS — I480 Paroxysmal atrial fibrillation: Secondary | ICD-10-CM | POA: Diagnosis not present

## 2016-05-13 DIAGNOSIS — Z7982 Long term (current) use of aspirin: Secondary | ICD-10-CM | POA: Diagnosis not present

## 2016-05-13 DIAGNOSIS — I119 Hypertensive heart disease without heart failure: Secondary | ICD-10-CM | POA: Diagnosis not present

## 2016-05-13 DIAGNOSIS — I252 Old myocardial infarction: Secondary | ICD-10-CM | POA: Diagnosis not present

## 2016-05-14 DIAGNOSIS — Z89612 Acquired absence of left leg above knee: Secondary | ICD-10-CM | POA: Diagnosis not present

## 2016-05-14 DIAGNOSIS — I252 Old myocardial infarction: Secondary | ICD-10-CM | POA: Diagnosis not present

## 2016-05-14 DIAGNOSIS — I2510 Atherosclerotic heart disease of native coronary artery without angina pectoris: Secondary | ICD-10-CM | POA: Diagnosis not present

## 2016-05-14 DIAGNOSIS — I779 Disorder of arteries and arterioles, unspecified: Secondary | ICD-10-CM | POA: Diagnosis not present

## 2016-05-14 DIAGNOSIS — I119 Hypertensive heart disease without heart failure: Secondary | ICD-10-CM | POA: Diagnosis not present

## 2016-05-14 DIAGNOSIS — I739 Peripheral vascular disease, unspecified: Secondary | ICD-10-CM | POA: Diagnosis not present

## 2016-05-14 DIAGNOSIS — Z7409 Other reduced mobility: Secondary | ICD-10-CM | POA: Diagnosis not present

## 2016-05-14 DIAGNOSIS — I480 Paroxysmal atrial fibrillation: Secondary | ICD-10-CM | POA: Diagnosis not present

## 2016-05-14 DIAGNOSIS — G8918 Other acute postprocedural pain: Secondary | ICD-10-CM | POA: Diagnosis not present

## 2016-05-14 DIAGNOSIS — Z89611 Acquired absence of right leg above knee: Secondary | ICD-10-CM | POA: Diagnosis not present

## 2016-05-14 DIAGNOSIS — N400 Enlarged prostate without lower urinary tract symptoms: Secondary | ICD-10-CM | POA: Diagnosis not present

## 2016-05-14 DIAGNOSIS — Z7982 Long term (current) use of aspirin: Secondary | ICD-10-CM | POA: Diagnosis not present

## 2016-05-15 DIAGNOSIS — I739 Peripheral vascular disease, unspecified: Secondary | ICD-10-CM | POA: Diagnosis not present

## 2016-05-15 DIAGNOSIS — Z7982 Long term (current) use of aspirin: Secondary | ICD-10-CM | POA: Diagnosis not present

## 2016-05-15 DIAGNOSIS — I252 Old myocardial infarction: Secondary | ICD-10-CM | POA: Diagnosis not present

## 2016-05-15 DIAGNOSIS — N400 Enlarged prostate without lower urinary tract symptoms: Secondary | ICD-10-CM | POA: Diagnosis not present

## 2016-05-15 DIAGNOSIS — I2510 Atherosclerotic heart disease of native coronary artery without angina pectoris: Secondary | ICD-10-CM | POA: Diagnosis not present

## 2016-05-15 DIAGNOSIS — Z7409 Other reduced mobility: Secondary | ICD-10-CM | POA: Diagnosis not present

## 2016-05-15 DIAGNOSIS — I119 Hypertensive heart disease without heart failure: Secondary | ICD-10-CM | POA: Diagnosis not present

## 2016-05-15 DIAGNOSIS — I480 Paroxysmal atrial fibrillation: Secondary | ICD-10-CM | POA: Diagnosis not present

## 2016-05-15 DIAGNOSIS — Z89612 Acquired absence of left leg above knee: Secondary | ICD-10-CM | POA: Diagnosis not present

## 2016-05-15 DIAGNOSIS — G8918 Other acute postprocedural pain: Secondary | ICD-10-CM | POA: Diagnosis not present

## 2016-05-15 DIAGNOSIS — Z89611 Acquired absence of right leg above knee: Secondary | ICD-10-CM | POA: Diagnosis not present

## 2016-05-16 DIAGNOSIS — I480 Paroxysmal atrial fibrillation: Secondary | ICD-10-CM | POA: Diagnosis not present

## 2016-05-16 DIAGNOSIS — I2510 Atherosclerotic heart disease of native coronary artery without angina pectoris: Secondary | ICD-10-CM | POA: Diagnosis not present

## 2016-05-16 DIAGNOSIS — Z7409 Other reduced mobility: Secondary | ICD-10-CM | POA: Diagnosis not present

## 2016-05-16 DIAGNOSIS — G8918 Other acute postprocedural pain: Secondary | ICD-10-CM | POA: Diagnosis not present

## 2016-05-16 DIAGNOSIS — I739 Peripheral vascular disease, unspecified: Secondary | ICD-10-CM | POA: Diagnosis not present

## 2016-05-16 DIAGNOSIS — I119 Hypertensive heart disease without heart failure: Secondary | ICD-10-CM | POA: Diagnosis not present

## 2016-05-16 DIAGNOSIS — Z89612 Acquired absence of left leg above knee: Secondary | ICD-10-CM | POA: Diagnosis not present

## 2016-05-16 DIAGNOSIS — N400 Enlarged prostate without lower urinary tract symptoms: Secondary | ICD-10-CM | POA: Diagnosis not present

## 2016-05-16 DIAGNOSIS — Z7982 Long term (current) use of aspirin: Secondary | ICD-10-CM | POA: Diagnosis not present

## 2016-05-16 DIAGNOSIS — Z89611 Acquired absence of right leg above knee: Secondary | ICD-10-CM | POA: Diagnosis not present

## 2016-05-16 DIAGNOSIS — I252 Old myocardial infarction: Secondary | ICD-10-CM | POA: Diagnosis not present

## 2016-05-18 DIAGNOSIS — G8918 Other acute postprocedural pain: Secondary | ICD-10-CM | POA: Diagnosis not present

## 2016-05-18 DIAGNOSIS — I739 Peripheral vascular disease, unspecified: Secondary | ICD-10-CM | POA: Diagnosis not present

## 2016-05-18 DIAGNOSIS — Z7409 Other reduced mobility: Secondary | ICD-10-CM | POA: Diagnosis not present

## 2016-05-18 DIAGNOSIS — I480 Paroxysmal atrial fibrillation: Secondary | ICD-10-CM | POA: Diagnosis not present

## 2016-05-18 DIAGNOSIS — Z7982 Long term (current) use of aspirin: Secondary | ICD-10-CM | POA: Diagnosis not present

## 2016-05-18 DIAGNOSIS — I2510 Atherosclerotic heart disease of native coronary artery without angina pectoris: Secondary | ICD-10-CM | POA: Diagnosis not present

## 2016-05-18 DIAGNOSIS — N400 Enlarged prostate without lower urinary tract symptoms: Secondary | ICD-10-CM | POA: Diagnosis not present

## 2016-05-18 DIAGNOSIS — I119 Hypertensive heart disease without heart failure: Secondary | ICD-10-CM | POA: Diagnosis not present

## 2016-05-18 DIAGNOSIS — Z89612 Acquired absence of left leg above knee: Secondary | ICD-10-CM | POA: Diagnosis not present

## 2016-05-18 DIAGNOSIS — I252 Old myocardial infarction: Secondary | ICD-10-CM | POA: Diagnosis not present

## 2016-05-18 DIAGNOSIS — Z89611 Acquired absence of right leg above knee: Secondary | ICD-10-CM | POA: Diagnosis not present

## 2016-05-19 ENCOUNTER — Other Ambulatory Visit: Payer: Self-pay

## 2016-05-19 DIAGNOSIS — Z89612 Acquired absence of left leg above knee: Secondary | ICD-10-CM | POA: Diagnosis not present

## 2016-05-19 DIAGNOSIS — Z89611 Acquired absence of right leg above knee: Secondary | ICD-10-CM | POA: Diagnosis not present

## 2016-05-19 DIAGNOSIS — G8918 Other acute postprocedural pain: Secondary | ICD-10-CM | POA: Diagnosis not present

## 2016-05-19 DIAGNOSIS — I252 Old myocardial infarction: Secondary | ICD-10-CM | POA: Diagnosis not present

## 2016-05-19 DIAGNOSIS — Z8673 Personal history of transient ischemic attack (TIA), and cerebral infarction without residual deficits: Secondary | ICD-10-CM | POA: Diagnosis not present

## 2016-05-19 DIAGNOSIS — Z7982 Long term (current) use of aspirin: Secondary | ICD-10-CM | POA: Diagnosis not present

## 2016-05-19 DIAGNOSIS — N400 Enlarged prostate without lower urinary tract symptoms: Secondary | ICD-10-CM | POA: Diagnosis not present

## 2016-05-19 DIAGNOSIS — I739 Peripheral vascular disease, unspecified: Secondary | ICD-10-CM | POA: Diagnosis not present

## 2016-05-19 DIAGNOSIS — Z7409 Other reduced mobility: Secondary | ICD-10-CM | POA: Diagnosis not present

## 2016-05-19 DIAGNOSIS — I119 Hypertensive heart disease without heart failure: Secondary | ICD-10-CM | POA: Diagnosis not present

## 2016-05-19 DIAGNOSIS — E7800 Pure hypercholesterolemia, unspecified: Secondary | ICD-10-CM | POA: Diagnosis not present

## 2016-05-19 DIAGNOSIS — I2510 Atherosclerotic heart disease of native coronary artery without angina pectoris: Secondary | ICD-10-CM | POA: Diagnosis not present

## 2016-05-19 NOTE — Patient Outreach (Signed)
Telephone follow up: Placed call to wife who reports that patient is still admitted to inpatient rehab at [facility].  Wife reports that patient is making progress.  Wife states anticipated discharge on 3/ 29 or 3/30.  PLAN: will plan follow up call on 3/ 30/2018.   Rowe Pavy, RN, BSN, CEN [facility] [employer] [phone]

## 2016-05-20 DIAGNOSIS — Z7409 Other reduced mobility: Secondary | ICD-10-CM | POA: Diagnosis not present

## 2016-05-20 DIAGNOSIS — I2510 Atherosclerotic heart disease of native coronary artery without angina pectoris: Secondary | ICD-10-CM | POA: Diagnosis not present

## 2016-05-20 DIAGNOSIS — N400 Enlarged prostate without lower urinary tract symptoms: Secondary | ICD-10-CM | POA: Diagnosis not present

## 2016-05-20 DIAGNOSIS — Z89612 Acquired absence of left leg above knee: Secondary | ICD-10-CM | POA: Diagnosis not present

## 2016-05-20 DIAGNOSIS — G8918 Other acute postprocedural pain: Secondary | ICD-10-CM | POA: Diagnosis not present

## 2016-05-20 DIAGNOSIS — I739 Peripheral vascular disease, unspecified: Secondary | ICD-10-CM | POA: Diagnosis not present

## 2016-05-20 DIAGNOSIS — Z89611 Acquired absence of right leg above knee: Secondary | ICD-10-CM | POA: Diagnosis not present

## 2016-05-20 DIAGNOSIS — E7800 Pure hypercholesterolemia, unspecified: Secondary | ICD-10-CM | POA: Diagnosis not present

## 2016-05-20 DIAGNOSIS — I119 Hypertensive heart disease without heart failure: Secondary | ICD-10-CM | POA: Diagnosis not present

## 2016-05-20 DIAGNOSIS — Z7982 Long term (current) use of aspirin: Secondary | ICD-10-CM | POA: Diagnosis not present

## 2016-05-20 DIAGNOSIS — I252 Old myocardial infarction: Secondary | ICD-10-CM | POA: Diagnosis not present

## 2016-05-20 DIAGNOSIS — Z8673 Personal history of transient ischemic attack (TIA), and cerebral infarction without residual deficits: Secondary | ICD-10-CM | POA: Diagnosis not present

## 2016-05-21 DIAGNOSIS — E7800 Pure hypercholesterolemia, unspecified: Secondary | ICD-10-CM | POA: Diagnosis not present

## 2016-05-21 DIAGNOSIS — Z7409 Other reduced mobility: Secondary | ICD-10-CM | POA: Diagnosis not present

## 2016-05-21 DIAGNOSIS — I119 Hypertensive heart disease without heart failure: Secondary | ICD-10-CM | POA: Diagnosis not present

## 2016-05-21 DIAGNOSIS — N400 Enlarged prostate without lower urinary tract symptoms: Secondary | ICD-10-CM | POA: Diagnosis not present

## 2016-05-21 DIAGNOSIS — I2510 Atherosclerotic heart disease of native coronary artery without angina pectoris: Secondary | ICD-10-CM | POA: Diagnosis not present

## 2016-05-21 DIAGNOSIS — Z89612 Acquired absence of left leg above knee: Secondary | ICD-10-CM | POA: Diagnosis not present

## 2016-05-21 DIAGNOSIS — G8918 Other acute postprocedural pain: Secondary | ICD-10-CM | POA: Diagnosis not present

## 2016-05-21 DIAGNOSIS — Z7982 Long term (current) use of aspirin: Secondary | ICD-10-CM | POA: Diagnosis not present

## 2016-05-21 DIAGNOSIS — I252 Old myocardial infarction: Secondary | ICD-10-CM | POA: Diagnosis not present

## 2016-05-21 DIAGNOSIS — Z8673 Personal history of transient ischemic attack (TIA), and cerebral infarction without residual deficits: Secondary | ICD-10-CM | POA: Diagnosis not present

## 2016-05-21 DIAGNOSIS — I739 Peripheral vascular disease, unspecified: Secondary | ICD-10-CM | POA: Diagnosis not present

## 2016-05-21 DIAGNOSIS — Z89611 Acquired absence of right leg above knee: Secondary | ICD-10-CM | POA: Diagnosis not present

## 2016-05-22 DIAGNOSIS — Z7409 Other reduced mobility: Secondary | ICD-10-CM | POA: Diagnosis not present

## 2016-05-22 DIAGNOSIS — Z89612 Acquired absence of left leg above knee: Secondary | ICD-10-CM | POA: Diagnosis not present

## 2016-05-22 DIAGNOSIS — G8918 Other acute postprocedural pain: Secondary | ICD-10-CM | POA: Diagnosis not present

## 2016-05-22 DIAGNOSIS — M792 Neuralgia and neuritis, unspecified: Secondary | ICD-10-CM | POA: Diagnosis not present

## 2016-05-22 DIAGNOSIS — Z89611 Acquired absence of right leg above knee: Secondary | ICD-10-CM | POA: Diagnosis not present

## 2016-05-23 ENCOUNTER — Other Ambulatory Visit: Payer: Self-pay

## 2016-05-23 NOTE — Patient Outreach (Signed)
Transition of care: Patient discharged from [facility] ( inpatient rehab at [facility]) on 3/29.  Son picked up patient.  Placed call to patient and spoke with wife. Wife reports that patient is easily frustrated that he can not get around his home like he wants to right now.  Sharyon Cable and at the home today completing repairs for home modifications in the bathroom.   Wife states that she has not yet had to review all discharge instructions. Reports that she is planning to do that today.  States that home health physical therapy, occupational therapy, and speech have been ordered through [facility] health. Wife plans to call them today to confirm. Wife states that she is concerned because patient  has a 25.00 co pay for every visit and she can not afford this co pay.   Wife has not yet reviewed discharge medication list so therefore not reviewed during this phone call.  Wife is a previous Psychologist, sport and exercise and feels comfortable managing medications.   Wife states sutures intact to stump without any drainage.     Patient has follow up planned with primary MD on 06/03/2016                                                         Surgeon on     06/02/2016   [facility] CM Care Plan Problem One     Most Recent Value  Care Plan Problem One  Recent hospital admission for afib.  Role Documenting the Problem One  Care Management [place] for Problem One  Active  THN Long Term Goal (31-90 days)  Patient will report no readmission for the next 60 days related to atrial fib.  THN Long Term Goal Start Date  02/20/16  THN Long Term Goal Met Date  04/22/16  Interventions for Problem One Long Term Goal  Reviewed signs and symtoms of atrial fib and when to call md.  THN CM Short Term Goal #1 (0-30 days)  Patient will taking all medications as prescribed for the next 30 days.  THN CM Short Term Goal #1 Start Date  02/20/16  [facility] CM Short Term Goal #1 Met Date  03/25/16  Interventions for Short Term  Goal #1  Encouraged patient to take all medications as prescribed.   THN CM Short Term Goal #2 (0-30 days)  Patient will report attending hospital follow up appointment with MD in the next 2 weeks.   THN CM Short Term Goal #2 Start Date  02/20/16  [facility] CM Short Term Goal #2 Met Date  03/18/16  Interventions for Short Term Goal #2  Encouraged wife to make a hospital follow up appointment.   THN CM Short Term Goal #3 (0-30 days)  Patient will review educational materials provided about a fib in teh next 2 weeks.  THN CM Short Term Goal #3 Start Date  02/26/16  [facility] CM Short Term Goal #3 Met Date  03/18/16  Interventions for Short Tern Goal #3  Provided Emmi education about A fib, Provided printed [place] atrial fib booklet. reviewed reasons to call MD and EMS.. reviewed importance of anti coagulation to reduce risk of strokes and clots.   THN CM Short Term Goal #4 (0-30 days)  Wife and or patient will call if needing assistance with care  coordinationin the next 2 weeks for appointments.  THN CM Short Term Goal #4 Start Date  04/22/16  [facility] CM Short Term Goal #4 Met Date  05/02/16  Interventions for Short Term Goal #4  Reviewed with wife plan and ask to call me if she needs assistance. Will follow up in 2 weeks.    THN CM Care Plan Problem Two     Most Recent Value  Care Plan Problem Two  Recent amuptation of leg.   Role Documenting the Problem Two  Care Management [place] for Problem Two  Active  Interventions for Problem Two Long Term Goal   Reviewed importance of timely follow up with home helath and MD's.  Home visit scheduled.   THN Long Term Goal (31-90) days  Patient will report no readmissions in the next 60 days.   THN Long Term Goal Start Date  05/23/16  THN CM Short Term Goal #1 (0-30 days)  Patient will report attending follow up appointments with MD in the next 2 weeks.   THN CM Short Term Goal #1 Start Date  05/23/16  Interventions for Short Term Goal #2   Reviewed  importance of follow up and notifiying MD of any changes in condition  THN CM Short Term Goal #2 (0-30 days)  Patient will report being comfortable getting around in his home in the next 30 days.   THN CM Short Term Goal #2 Start Date  05/23/16  Interventions for Short Term Goal #2  Reviewed importance of PT, OT to assistance with adjustment to new life style.  Offered emotional support as well. Home visit planned to assess current status.       PLAN: Offered home visit for 05/29/2016 and wife has accepted.  Reviewed importance of calling home health to confirm orders were received.  She states she will do this. Reviewed importance of calling MD for any changes in condition.  Will send this note to MD.   Tomasa Rand, RN, BSN, CEN [place] [facility] [phone]

## 2016-05-26 DIAGNOSIS — Z79891 Long term (current) use of opiate analgesic: Secondary | ICD-10-CM | POA: Diagnosis not present

## 2016-05-26 DIAGNOSIS — Z7901 Long term (current) use of anticoagulants: Secondary | ICD-10-CM | POA: Diagnosis not present

## 2016-05-26 DIAGNOSIS — Z89612 Acquired absence of left leg above knee: Secondary | ICD-10-CM | POA: Diagnosis not present

## 2016-05-26 DIAGNOSIS — Z7982 Long term (current) use of aspirin: Secondary | ICD-10-CM | POA: Diagnosis not present

## 2016-05-26 DIAGNOSIS — I248 Other forms of acute ischemic heart disease: Secondary | ICD-10-CM | POA: Diagnosis not present

## 2016-05-26 DIAGNOSIS — Z8673 Personal history of transient ischemic attack (TIA), and cerebral infarction without residual deficits: Secondary | ICD-10-CM | POA: Diagnosis not present

## 2016-05-26 DIAGNOSIS — I5021 Acute systolic (congestive) heart failure: Secondary | ICD-10-CM | POA: Diagnosis not present

## 2016-05-26 DIAGNOSIS — Z89611 Acquired absence of right leg above knee: Secondary | ICD-10-CM | POA: Diagnosis not present

## 2016-05-26 DIAGNOSIS — Z4781 Encounter for orthopedic aftercare following surgical amputation: Secondary | ICD-10-CM | POA: Diagnosis not present

## 2016-05-26 DIAGNOSIS — I2510 Atherosclerotic heart disease of native coronary artery without angina pectoris: Secondary | ICD-10-CM | POA: Diagnosis not present

## 2016-05-26 DIAGNOSIS — J449 Chronic obstructive pulmonary disease, unspecified: Secondary | ICD-10-CM | POA: Diagnosis not present

## 2016-05-26 DIAGNOSIS — I110 Hypertensive heart disease with heart failure: Secondary | ICD-10-CM | POA: Diagnosis not present

## 2016-05-26 DIAGNOSIS — G629 Polyneuropathy, unspecified: Secondary | ICD-10-CM | POA: Diagnosis not present

## 2016-05-26 DIAGNOSIS — I779 Disorder of arteries and arterioles, unspecified: Secondary | ICD-10-CM | POA: Diagnosis not present

## 2016-05-26 DIAGNOSIS — I739 Peripheral vascular disease, unspecified: Secondary | ICD-10-CM | POA: Diagnosis not present

## 2016-05-26 DIAGNOSIS — F329 Major depressive disorder, single episode, unspecified: Secondary | ICD-10-CM | POA: Diagnosis not present

## 2016-05-27 ENCOUNTER — Other Ambulatory Visit: Payer: Self-pay

## 2016-05-27 NOTE — Patient Outreach (Signed)
[facility]) Care Management   05/27/2016  [name] [name] [birth year] [id number]  [name] [name] is an 76 y.o. male Arrived for home visit. Wife, [name] present. Subjective:  Patient reports that he is trying to get use to a new normal.  Reports that he is managing pretty well at home. States revisions to bathroom are complete and his is able to manage in bathroom alone. Wife reports patient continues to struggle with depression and physical limitations.  Reports patient was discharged from home health physical therapy due to no needs. Patient remains active with speech therapy to improve memory.  ( occupational therapy has not started yet).  Wife manages all medications at this time.  Wife reports that patient has a small area to bottom in the hospital that was healed at discharged. Patient declined skin assessment to bottom today during home visit. Patient has follow up planned with vascular and primary MD's.  Also plans to follow up with the pain center.   Objective:  No height due to bilateral amputations. Patient demonstrated ability to get power scooter into bathroom.   Awake and alert.  Bilateral stumps without any signs of infection.  Vitals:   05/27/16 1112  BP: 116/60  Pulse: 60  Resp: 20  SpO2: 98%  Weight: 143 lb (64.9 kg)   Review of Systems  Constitutional: Negative.   HENT: Positive for hearing loss.   Eyes:       Reports "fuzzy"vision since surgery  Cardiovascular: Negative for chest pain, palpitations and orthopnea.  Gastrointestinal: Negative.   Genitourinary: Negative.   Musculoskeletal: Positive for falls and joint pain.  Skin:       Recent amputation to the left stump  Neurological: Negative.   Endo/Heme/Allergies: Negative.   Psychiatric/Behavioral: Negative.     Physical Exam  Constitutional: He is oriented to person, place, and time. He appears well-developed and well-nourished.  Cardiovascular: Normal rate and normal heart sounds.    Respiratory: Effort normal and breath sounds normal.  GI: Soft. Bowel sounds are normal.  Musculoskeletal: He exhibits no edema.  Neurological: He is alert and oriented to person, place, and time.  Grips equal and strong  Skin: Skin is warm and dry.  Right stump healed. Left stump with staple line intact. No drainage.  Psychiatric: He has a normal mood and affect. His behavior is normal. Judgment and thought content normal.    Encounter Medications:   Outpatient Encounter Prescriptions as of 05/27/2016  Medication Sig Note  . acetaminophen (TYLENOL) 325 MG tablet Take 650 mg by mouth every 4 (four) hours as needed.   Marland Kitchen aspirin EC 81 MG tablet Take 81 mg by mouth daily.   . cholecalciferol (VITAMIN D) 1000 UNITS tablet Take 1,000 Units by mouth 2 (two) times daily. Reported on 02/21/2015   . citalopram (CELEXA) 20 MG tablet daily.   . clopidogrel (PLAVIX) 75 MG tablet Take 75 mg by mouth daily.   . Cyanocobalamin (VITAMIN B-12) 5000 MCG TBDP Take 1 tablet by mouth daily.   . ferrous sulfate 325 (65 FE) MG tablet Take 325 mg by mouth 2 (two) times daily after a meal.    . folic acid (FOLVITE) 1 MG tablet Take 1 mg by mouth daily.   . furosemide (LASIX) 20 MG tablet Take 20 mg by mouth.   . gabapentin (NEURONTIN) 300 MG capsule Take 900 mg by mouth 3 (three) times daily.    Marland Kitchen HYDROcodone-acetaminophen (NORCO/VICODIN) 5-325 MG tablet Take 1 tablet by mouth  every 4 (four) hours as needed for moderate pain. 03/11/2016: Wife reports taking Norco OR hydromorphone but not both   . lisinopril (PRINIVIL,ZESTRIL) 5 MG tablet Take 5 mg by mouth daily.   . metoprolol succinate (TOPROL-XL) 25 MG 24 hr tablet Take 25 mg by mouth 2 (two) times daily.   . Multiple Vitamin (MULTIVITAMIN) tablet Take 1 tablet by mouth daily.   . Omega-3 Fatty Acids (FISH OIL) 1200 MG CPDR Take 2 capsules by mouth.   . ondansetron (ZOFRAN) 4 MG tablet Take 4 mg by mouth every 8 (eight) hours as needed for nausea/vomiting.   .  rosuvastatin (CRESTOR) 20 MG tablet Take 20 mg by mouth daily.   . tamsulosin (FLOMAX) 0.4 MG CAPS capsule Take 0.4 mg by mouth. Reported on 02/21/2015   . warfarin (COUMADIN) 3 MG tablet Take 3 mg by mouth daily.   Marland Kitchen HYDROmorphone (DILAUDID) 2 MG tablet Take 2 mg by mouth every 4 (four) hours as needed for severe pain.    . metoprolol succinate (TOPROL-XL) 100 MG 24 hr tablet Take 100 mg by mouth daily. Reported on 03/20/2015   . sennosides-docusate sodium (SENOKOT-S) 8.6-50 MG tablet Take 2 tablets by mouth daily. 03/11/2016: Only uses PRN  . warfarin (COUMADIN) 2 MG tablet 3 mg daily. Take 2 tablets daily on Sun, Monday, Wednesday & Friday and 1 tablet daily on Tues, Thursday and Saturday    No facility-administered encounter medications on file as of 05/27/2016.     Functional Status:   In your present state of health, do you have any difficulty performing the following activities: 05/27/2016 02/26/2016  Hearing? Tempie Donning  Vision? Y Y  Difficulty concentrating or making decisions? Tempie Donning  Walking or climbing stairs? Y Y  Dressing or bathing? Y N  Doing errands, shopping? Tempie Donning  Preparing Food and eating ? N N  Using the Toilet? N Y  In the past six months, have you accidently leaked urine? N Y  Do you have problems with loss of bowel control? N N  Managing your Medications? Y N  Managing your Finances? Tempie Donning  Housekeeping or managing your Housekeeping? Tempie Donning  Some recent data might be hidden    Fall/Depression Screening:    PHQ 2/9 Scores 05/27/2016 02/26/2016 02/15/2016 02/21/2015 02/08/2015  PHQ - 2 Score '4 4 4 6 3  ' PHQ- 9 Score '7 13 13 14 7   ' Fall Risk  05/27/2016 02/26/2016 02/21/2015 02/08/2015  Falls in the past year? Yes Yes Yes No  Number falls in past yr: 2 or more 2 or more 1 -  Injury with Fall? Yes Yes No -  Risk Factor Category  High Fall Risk - - -  Risk for fall due to : History of fall(s) History of fall(s) Impaired mobility;Other (Comment) -  Risk for fall due to (comments): - -  mobility is improving. no assistive devices. active with therapy -  Follow up Falls prevention discussed Falls prevention discussed Falls prevention discussed -   Assessment:   (1) reviewed transition of care program and weekly contacts. Consent up to date. Has contact information.  Declines any questions. (2) discharged from [facility] on 3/29 after left leg amputation.   10 days of inpatient rehab. (3) active with home health (4) has follow up scheduled. (5) positive depression screening. Patient reports that anyone would be depressed after losing both legs. (6) recent fall while inpatient.  (7) Skin: Right stump healed.  Left stump with staples intact. No  signs of infection. Refused skin assessment to buttocks.  Plan:  (1) consent in EMR. (2) Reviewed discharge instructions with patient and wife. (3) encouraged patient to take advantage of all pointers from therapy. (4) encouraged patient to attend follow up with MD's. (5) will send this note to MD.  Encouraged patient to consider amputee support groups. (6) review fall precautions in the home.  (7) reviewed importance of patient allowing wife to assess skin to buttocks at least 2 times per week. Reviewed importance of maintaining good skin hygiene and observing for any open skin areas and reporting to MD.   Goal setting and care planning during home visit and primary goal is to avoid readmission. Next telephone outreach in 1 week.  [facility] CM Care Plan Problem One     Most Recent Value  Care Plan Problem One  Recent hospital admission for afib.  Role Documenting the Problem One  Care Management [place] for Problem One  Active  THN Long Term Goal (31-90 days)  Patient will report no readmission for the next 60 days related to atrial fib.  THN Long Term Goal Start Date  02/20/16  THN Long Term Goal Met Date  04/22/16  Interventions for Problem One Long Term Goal  Reviewed signs and symtoms of atrial fib and when to call md.   THN CM Short Term Goal #1 (0-30 days)  Patient will taking all medications as prescribed for the next 30 days.  THN CM Short Term Goal #1 Start Date  02/20/16  [facility] CM Short Term Goal #1 Met Date  03/25/16  Interventions for Short Term Goal #1  Encouraged patient to take all medications as prescribed.   THN CM Short Term Goal #2 (0-30 days)  Patient will report attending hospital follow up appointment with MD in the next 2 weeks.   THN CM Short Term Goal #2 Start Date  02/20/16  [facility] CM Short Term Goal #2 Met Date  03/18/16  Interventions for Short Term Goal #2  Encouraged wife to make a hospital follow up appointment.   THN CM Short Term Goal #3 (0-30 days)  Patient will review educational materials provided about a fib in teh next 2 weeks.  THN CM Short Term Goal #3 Start Date  02/26/16  [facility] CM Short Term Goal #3 Met Date  03/18/16  Interventions for Short Tern Goal #3  Provided Emmi education about A fib, Provided printed  atrial fib booklet. reviewed reasons to call MD and EMS.. reviewed importance of anti coagulation to reduce risk of strokes and clots.   THN CM Short Term Goal #4 (0-30 days)  Wife and or patient will call if needing assistance with care coordinationin the next 2 weeks for appointments.  THN CM Short Term Goal #4 Start Date  04/22/16  [facility] CM Short Term Goal #4 Met Date  05/02/16  Interventions for Short Term Goal #4  Reviewed with wife plan and ask to call me if she needs assistance. Will follow up in 2 weeks.    THN CM Care Plan Problem Two     Most Recent Value  Care Plan Problem Two  Recent amuptation of leg.   Role Documenting the Problem Two  Care Management [place] for Problem Two  Active  Interventions for Problem Two Long Term Goal   home visit completed. note sent to MD.  [facility] Long Term Goal (31-90) days  Patient will report no readmissions in the next 60  days.   THN Long Term Goal Start Date  05/23/16  THN CM Short Term Goal #1 (0-30  days)  Patient will report attending follow up appointments with MD in the next 2 weeks.   THN CM Short Term Goal #1 Start Date  05/23/16  Interventions for Short Term Goal #2   Encouraged patient to attend follow up with MD  [facility] CM Short Term Goal #2 (0-30 days)  Patient will report being comfortable getting around in his home in the next 30 days.   THN CM Short Term Goal #2 Start Date  05/23/16  Interventions for Short Term Goal #2  Reviewed transition period. Offered support for progress at this time.       Tomasa Rand, RN, BSN, CEN [facility] [employer] [phone]

## 2016-05-29 ENCOUNTER — Ambulatory Visit: Payer: PPO

## 2016-06-02 DIAGNOSIS — I779 Disorder of arteries and arterioles, unspecified: Secondary | ICD-10-CM | POA: Diagnosis not present

## 2016-06-03 ENCOUNTER — Other Ambulatory Visit: Payer: Self-pay

## 2016-06-03 DIAGNOSIS — S78111A Complete traumatic amputation at level between right hip and knee, initial encounter: Secondary | ICD-10-CM | POA: Diagnosis not present

## 2016-06-03 DIAGNOSIS — I4891 Unspecified atrial fibrillation: Secondary | ICD-10-CM | POA: Diagnosis not present

## 2016-06-03 DIAGNOSIS — Z7901 Long term (current) use of anticoagulants: Secondary | ICD-10-CM | POA: Diagnosis not present

## 2016-06-03 DIAGNOSIS — R2689 Other abnormalities of gait and mobility: Secondary | ICD-10-CM | POA: Diagnosis not present

## 2016-06-03 NOTE — Patient Outreach (Signed)
Transition of care call: Placed call to patient and spoke with wife who reports that patient is doing amazing. States that he went back to surgeon yesterday and had staples removed and patient was discharged.  Reports that patient continues to struggle emotionally with the loss of both legs.  Currently [employer] is building patient a ramp out the back of his home for patient to get to patio. Patient has hospital follow up planned with primary MD today.  Wife is going to inquire about referral for biotech and outpatient PT and OT.   Wife denies any new concerns today.  PLAN: Will continue weekly outreach for transition of care.  Rowe Pavy, RN, BSN, CEN [facility] [employer] [phone]

## 2016-06-07 DIAGNOSIS — S0190XA Unspecified open wound of unspecified part of head, initial encounter: Secondary | ICD-10-CM | POA: Diagnosis not present

## 2016-06-07 DIAGNOSIS — Z89611 Acquired absence of right leg above knee: Secondary | ICD-10-CM | POA: Diagnosis not present

## 2016-06-07 DIAGNOSIS — S0091XA Abrasion of unspecified part of head, initial encounter: Secondary | ICD-10-CM | POA: Diagnosis not present

## 2016-06-07 DIAGNOSIS — S098XXA Other specified injuries of head, initial encounter: Secondary | ICD-10-CM | POA: Diagnosis not present

## 2016-06-07 DIAGNOSIS — I5020 Unspecified systolic (congestive) heart failure: Secondary | ICD-10-CM | POA: Diagnosis not present

## 2016-06-07 DIAGNOSIS — Z8673 Personal history of transient ischemic attack (TIA), and cerebral infarction without residual deficits: Secondary | ICD-10-CM | POA: Diagnosis not present

## 2016-06-07 DIAGNOSIS — R269 Unspecified abnormalities of gait and mobility: Secondary | ICD-10-CM | POA: Diagnosis not present

## 2016-06-07 DIAGNOSIS — I739 Peripheral vascular disease, unspecified: Secondary | ICD-10-CM | POA: Diagnosis not present

## 2016-06-07 DIAGNOSIS — S0990XA Unspecified injury of head, initial encounter: Secondary | ICD-10-CM | POA: Diagnosis not present

## 2016-06-09 DIAGNOSIS — Z7901 Long term (current) use of anticoagulants: Secondary | ICD-10-CM | POA: Diagnosis not present

## 2016-06-09 DIAGNOSIS — I110 Hypertensive heart disease with heart failure: Secondary | ICD-10-CM | POA: Diagnosis not present

## 2016-06-09 DIAGNOSIS — E785 Hyperlipidemia, unspecified: Secondary | ICD-10-CM | POA: Diagnosis not present

## 2016-06-09 DIAGNOSIS — I2510 Atherosclerotic heart disease of native coronary artery without angina pectoris: Secondary | ICD-10-CM | POA: Diagnosis not present

## 2016-06-09 DIAGNOSIS — Z4781 Encounter for orthopedic aftercare following surgical amputation: Secondary | ICD-10-CM | POA: Diagnosis not present

## 2016-06-09 DIAGNOSIS — I481 Persistent atrial fibrillation: Secondary | ICD-10-CM | POA: Diagnosis not present

## 2016-06-10 ENCOUNTER — Other Ambulatory Visit: Payer: Self-pay

## 2016-06-10 DIAGNOSIS — M25652 Stiffness of left hip, not elsewhere classified: Secondary | ICD-10-CM | POA: Diagnosis not present

## 2016-06-10 DIAGNOSIS — R5383 Other fatigue: Secondary | ICD-10-CM | POA: Diagnosis not present

## 2016-06-10 DIAGNOSIS — R293 Abnormal posture: Secondary | ICD-10-CM | POA: Diagnosis not present

## 2016-06-10 DIAGNOSIS — Z89612 Acquired absence of left leg above knee: Secondary | ICD-10-CM | POA: Diagnosis not present

## 2016-06-10 DIAGNOSIS — M6281 Muscle weakness (generalized): Secondary | ICD-10-CM | POA: Diagnosis not present

## 2016-06-10 DIAGNOSIS — M25651 Stiffness of right hip, not elsewhere classified: Secondary | ICD-10-CM | POA: Diagnosis not present

## 2016-06-10 DIAGNOSIS — G546 Phantom limb syndrome with pain: Secondary | ICD-10-CM | POA: Diagnosis not present

## 2016-06-10 DIAGNOSIS — R2689 Other abnormalities of gait and mobility: Secondary | ICD-10-CM | POA: Diagnosis not present

## 2016-06-10 NOTE — Patient Outreach (Signed)
Transition of care call: Placed call to patient and spoke with wife. Wife states that patient is doing well.  Reports one issue, when patient motorized chair/scooter flipped over backwards.  States that patient hit head on ground. Went to the ED with a negative CT head.  Reports that patient was carrying something in his lap on scotter and leaned backwards and tipped scooter over.  Denies hitting stumps.  No other injuries.    Wife reports that patient did see primary MD for follow up and everything is going well.   PLAN: Reviewed fall precautions and special attention needed when out doors. Wife voiced understanding. Will continue weekly transition of care.  Rowe Pavy, RN, BSN, CEN [facility] [employer] [phone]

## 2016-06-13 DIAGNOSIS — Z4781 Encounter for orthopedic aftercare following surgical amputation: Secondary | ICD-10-CM | POA: Diagnosis not present

## 2016-06-13 DIAGNOSIS — Z89611 Acquired absence of right leg above knee: Secondary | ICD-10-CM | POA: Diagnosis not present

## 2016-06-13 DIAGNOSIS — Z89612 Acquired absence of left leg above knee: Secondary | ICD-10-CM | POA: Diagnosis not present

## 2016-06-13 DIAGNOSIS — I739 Peripheral vascular disease, unspecified: Secondary | ICD-10-CM | POA: Diagnosis not present

## 2016-06-13 DIAGNOSIS — I5020 Unspecified systolic (congestive) heart failure: Secondary | ICD-10-CM | POA: Diagnosis not present

## 2016-06-13 DIAGNOSIS — I779 Disorder of arteries and arterioles, unspecified: Secondary | ICD-10-CM | POA: Diagnosis not present

## 2016-06-13 DIAGNOSIS — Z8673 Personal history of transient ischemic attack (TIA), and cerebral infarction without residual deficits: Secondary | ICD-10-CM | POA: Diagnosis not present

## 2016-06-13 DIAGNOSIS — R269 Unspecified abnormalities of gait and mobility: Secondary | ICD-10-CM | POA: Diagnosis not present

## 2016-06-16 DIAGNOSIS — G629 Polyneuropathy, unspecified: Secondary | ICD-10-CM | POA: Diagnosis not present

## 2016-06-16 DIAGNOSIS — M961 Postlaminectomy syndrome, not elsewhere classified: Secondary | ICD-10-CM | POA: Diagnosis not present

## 2016-06-16 DIAGNOSIS — I739 Peripheral vascular disease, unspecified: Secondary | ICD-10-CM | POA: Diagnosis not present

## 2016-06-16 DIAGNOSIS — Z8673 Personal history of transient ischemic attack (TIA), and cerebral infarction without residual deficits: Secondary | ICD-10-CM | POA: Diagnosis not present

## 2016-06-16 DIAGNOSIS — M5136 Other intervertebral disc degeneration, lumbar region: Secondary | ICD-10-CM | POA: Diagnosis not present

## 2016-06-16 DIAGNOSIS — G894 Chronic pain syndrome: Secondary | ICD-10-CM | POA: Diagnosis not present

## 2016-06-16 DIAGNOSIS — G547 Phantom limb syndrome without pain: Secondary | ICD-10-CM | POA: Diagnosis not present

## 2016-06-16 DIAGNOSIS — T8789 Other complications of amputation stump: Secondary | ICD-10-CM | POA: Diagnosis not present

## 2016-06-16 DIAGNOSIS — Z89612 Acquired absence of left leg above knee: Secondary | ICD-10-CM | POA: Diagnosis not present

## 2016-06-17 ENCOUNTER — Other Ambulatory Visit: Payer: Self-pay

## 2016-06-17 NOTE — Patient Outreach (Signed)
Transition of care: Placed call to patient and spoke with wife. Wife states that patient is doing well.  Reports that patient has a trapeze bar that is helping at home.  Wife states that patient followed up with Peyton Najjar at [employer].   Wife denies any further falls or concerns today.  PLAN: Will continue to follow up weekly for transition of care.  Encouraged wife to monitor incline from gravel to ramp and risk if tipping over with uneven surfaces.  Encouraged wife to remind patient to move from one surface to another slowly in chair or to see if they need a concrete pad from ramp to drive way.    Rowe Pavy, RN, BSN, CEN [facility] [employer] [phone]

## 2016-06-18 DIAGNOSIS — H25042 Posterior subcapsular polar age-related cataract, left eye: Secondary | ICD-10-CM | POA: Diagnosis not present

## 2016-06-18 DIAGNOSIS — H52223 Regular astigmatism, bilateral: Secondary | ICD-10-CM | POA: Diagnosis not present

## 2016-06-18 DIAGNOSIS — I10 Essential (primary) hypertension: Secondary | ICD-10-CM | POA: Diagnosis not present

## 2016-06-18 DIAGNOSIS — H524 Presbyopia: Secondary | ICD-10-CM | POA: Diagnosis not present

## 2016-06-18 DIAGNOSIS — H43812 Vitreous degeneration, left eye: Secondary | ICD-10-CM | POA: Diagnosis not present

## 2016-06-18 DIAGNOSIS — H5203 Hypermetropia, bilateral: Secondary | ICD-10-CM | POA: Diagnosis not present

## 2016-06-18 DIAGNOSIS — H4322 Crystalline deposits in vitreous body, left eye: Secondary | ICD-10-CM | POA: Diagnosis not present

## 2016-06-24 ENCOUNTER — Other Ambulatory Visit: Payer: Self-pay

## 2016-06-24 NOTE — Patient Outreach (Signed)
Final transition of care call:  Placed call to patient and spoke with wife who states that patient is doing well and she feels his overall health has improved.  Patient continues to go to rehab at the hospital as an outpatient to work on balance issues.  Denies any new problems or concerns today.   PLAN: reviewed completion of transition of care program successfully. Will plan follow up call in 30 days and then close if no other needs.  Encouraged wife to call sooner if needed. She agreed.  Rowe Pavy, RN, BSN, CEN [facility] [employer] [phone]

## 2016-06-25 DIAGNOSIS — R5383 Other fatigue: Secondary | ICD-10-CM | POA: Diagnosis not present

## 2016-06-25 DIAGNOSIS — M25651 Stiffness of right hip, not elsewhere classified: Secondary | ICD-10-CM | POA: Diagnosis not present

## 2016-06-25 DIAGNOSIS — R2689 Other abnormalities of gait and mobility: Secondary | ICD-10-CM | POA: Diagnosis not present

## 2016-06-25 DIAGNOSIS — G546 Phantom limb syndrome with pain: Secondary | ICD-10-CM | POA: Diagnosis not present

## 2016-06-25 DIAGNOSIS — R293 Abnormal posture: Secondary | ICD-10-CM | POA: Diagnosis not present

## 2016-06-25 DIAGNOSIS — M6281 Muscle weakness (generalized): Secondary | ICD-10-CM | POA: Diagnosis not present

## 2016-06-25 DIAGNOSIS — Z89612 Acquired absence of left leg above knee: Secondary | ICD-10-CM | POA: Diagnosis not present

## 2016-06-25 DIAGNOSIS — M25652 Stiffness of left hip, not elsewhere classified: Secondary | ICD-10-CM | POA: Diagnosis not present

## 2016-07-07 DIAGNOSIS — Z8673 Personal history of transient ischemic attack (TIA), and cerebral infarction without residual deficits: Secondary | ICD-10-CM | POA: Diagnosis not present

## 2016-07-07 DIAGNOSIS — Z89611 Acquired absence of right leg above knee: Secondary | ICD-10-CM | POA: Diagnosis not present

## 2016-07-07 DIAGNOSIS — R269 Unspecified abnormalities of gait and mobility: Secondary | ICD-10-CM | POA: Diagnosis not present

## 2016-07-07 DIAGNOSIS — I5020 Unspecified systolic (congestive) heart failure: Secondary | ICD-10-CM | POA: Diagnosis not present

## 2016-07-07 DIAGNOSIS — I739 Peripheral vascular disease, unspecified: Secondary | ICD-10-CM | POA: Diagnosis not present

## 2016-07-10 DIAGNOSIS — Z7901 Long term (current) use of anticoagulants: Secondary | ICD-10-CM | POA: Diagnosis not present

## 2016-07-13 DIAGNOSIS — I5020 Unspecified systolic (congestive) heart failure: Secondary | ICD-10-CM | POA: Diagnosis not present

## 2016-07-13 DIAGNOSIS — Z8673 Personal history of transient ischemic attack (TIA), and cerebral infarction without residual deficits: Secondary | ICD-10-CM | POA: Diagnosis not present

## 2016-07-13 DIAGNOSIS — I739 Peripheral vascular disease, unspecified: Secondary | ICD-10-CM | POA: Diagnosis not present

## 2016-07-13 DIAGNOSIS — Z4781 Encounter for orthopedic aftercare following surgical amputation: Secondary | ICD-10-CM | POA: Diagnosis not present

## 2016-07-13 DIAGNOSIS — R269 Unspecified abnormalities of gait and mobility: Secondary | ICD-10-CM | POA: Diagnosis not present

## 2016-07-13 DIAGNOSIS — I779 Disorder of arteries and arterioles, unspecified: Secondary | ICD-10-CM | POA: Diagnosis not present

## 2016-07-13 DIAGNOSIS — Z89612 Acquired absence of left leg above knee: Secondary | ICD-10-CM | POA: Diagnosis not present

## 2016-07-13 DIAGNOSIS — Z89611 Acquired absence of right leg above knee: Secondary | ICD-10-CM | POA: Diagnosis not present

## 2016-07-15 DIAGNOSIS — Z7901 Long term (current) use of anticoagulants: Secondary | ICD-10-CM | POA: Diagnosis not present

## 2016-07-23 ENCOUNTER — Other Ambulatory Visit: Payer: Self-pay

## 2016-07-23 NOTE — Patient Outreach (Signed)
60 days post discharge phone follow up: Placed call to patient who reports that he is doing well. Reports that he continues to have phantom pain. States that he has discussed with PT and MD Reports gabapentin was increase.  Reports no recent falls in the last month. Reports that he is trying to get enough money together for a new electric scooter. .  Denies any new problems at this time.   Reviewed case closure and patient is in agreement. Goals met.  PLAN: will close case as goals met. Will notify MD and send patient a case closure letter.  [facility] CM Care Plan Problem One     Most Recent Value  Care Plan Problem One  Recent hospital admission for afib.  Role Documenting the Problem One  Care Management [place] for Problem One  Active  [facility] Long Term Goal   Patient will report no readmission for the next 60 days related to atrial fib.  [facility] Long Term Goal Start Date  02/20/16  [facility] Long Term Goal Met Date  04/22/16  Interventions for Problem One Long Term Goal  Reviewed signs and symtoms of atrial fib and when to call md.  [facility] CM Short Term Goal #1   Patient will taking all medications as prescribed for the next 30 days.  [facility] CM Short Term Goal #1 Start Date  02/20/16  [facility] CM Short Term Goal #1 Met Date  03/25/16  Interventions for Short Term Goal #1  Encouraged patient to take all medications as prescribed.   [facility] CM Short Term Goal #2   Patient will report attending hospital follow up appointment with MD in the next 2 weeks.   [facility] CM Short Term Goal #2 Start Date  02/20/16  [facility] CM Short Term Goal #2 Met Date  03/18/16  Interventions for Short Term Goal #2  Encouraged wife to make a hospital follow up appointment.   [facility]N CM Short Term Goal #3  Patient will review educational materials provided about a fib in teh next 2 weeks.  [facility] CM Short Term Goal #3 Start Date  02/26/16  [facility] CM Short Term Goal #3 Met Date  03/18/16  Interventions for Short Tern Goal #3  Provided Emmi education  about A fib, Provided printed [place] atrial fib booklet. reviewed reasons to call MD and EMS.. reviewed importance of anti coagulation to reduce risk of strokes and clots.   [facility] CM Short Term Goal #4  Wife and or patient will call if needing assistance with care coordinationin the next 2 weeks for appointments.  [facility] CM Short Term Goal #4 Start Date  04/22/16  [facility] CM Short Term Goal #4 Met Date  05/02/16  Interventions for Short Term Goal #4  Reviewed with wife plan and ask to call me if she needs assistance. Will follow up in 2 weeks.    [facility] CM Care Plan Problem Two     Most Recent Value  Care Plan Problem Two  Recent amuptation of leg.   Role Documenting the Problem Two  Care Management Coordinator  Care Plan for Problem Two  Active  Interventions for Problem Two Long Term Goal   Reviewed importance of notifying MD for and changes in condition.   [facility]N Long Term Goal  Patient will report no readmissions in the next 60 days.   [facility]N Long Term Goal Start Date  05/23/16  [facility] Long Term Goal Met Date  07/23/16  [facility] CM Short Term Goal #1  Patient will report attending follow up appointments with MD in the next 2 weeks.   [facility]N CM Short Term Goal #1 Start Date  05/23/16  [facility] CM Short Term Goal #1 Met Date   06/03/16  Interventions for Short Term Goal #2   Encouraged patient to attend follow up with MD  [facility] CM Short Term Goal #2   Patient will report being comfortable getting around in his home in the next 30 days.   [facility] CM Short Term Goal #2 Start Date  05/23/16  [facility] CM Short Term Goal #2 Met Date  06/24/16  Interventions for Short Term Goal #2  Encouraged patient to use trapeze bars to assist with movements. Encouraged patient to seek support from local ampuation support groups.      Tomasa Rand, RN, BSN, CEN [facility] [employer] [phone]

## 2016-07-24 ENCOUNTER — Other Ambulatory Visit: Payer: Self-pay

## 2016-07-30 DIAGNOSIS — Z8673 Personal history of transient ischemic attack (TIA), and cerebral infarction without residual deficits: Secondary | ICD-10-CM | POA: Diagnosis not present

## 2016-07-30 DIAGNOSIS — I517 Cardiomegaly: Secondary | ICD-10-CM | POA: Diagnosis not present

## 2016-07-30 DIAGNOSIS — I509 Heart failure, unspecified: Secondary | ICD-10-CM | POA: Diagnosis not present

## 2016-07-30 DIAGNOSIS — Z89612 Acquired absence of left leg above knee: Secondary | ICD-10-CM | POA: Diagnosis not present

## 2016-07-30 DIAGNOSIS — J45909 Unspecified asthma, uncomplicated: Secondary | ICD-10-CM | POA: Diagnosis not present

## 2016-07-30 DIAGNOSIS — I10 Essential (primary) hypertension: Secondary | ICD-10-CM | POA: Diagnosis not present

## 2016-07-30 DIAGNOSIS — Z7902 Long term (current) use of antithrombotics/antiplatelets: Secondary | ICD-10-CM | POA: Diagnosis not present

## 2016-07-30 DIAGNOSIS — I6523 Occlusion and stenosis of bilateral carotid arteries: Secondary | ICD-10-CM | POA: Diagnosis not present

## 2016-07-30 DIAGNOSIS — I2510 Atherosclerotic heart disease of native coronary artery without angina pectoris: Secondary | ICD-10-CM | POA: Diagnosis not present

## 2016-07-30 DIAGNOSIS — R531 Weakness: Secondary | ICD-10-CM | POA: Diagnosis not present

## 2016-07-30 DIAGNOSIS — I959 Hypotension, unspecified: Secondary | ICD-10-CM | POA: Diagnosis not present

## 2016-07-30 DIAGNOSIS — Z7982 Long term (current) use of aspirin: Secondary | ICD-10-CM | POA: Diagnosis not present

## 2016-07-30 DIAGNOSIS — Z89611 Acquired absence of right leg above knee: Secondary | ICD-10-CM | POA: Diagnosis not present

## 2016-07-30 DIAGNOSIS — I252 Old myocardial infarction: Secondary | ICD-10-CM | POA: Diagnosis not present

## 2016-07-30 DIAGNOSIS — E7800 Pure hypercholesterolemia, unspecified: Secondary | ICD-10-CM | POA: Diagnosis not present

## 2016-08-06 DIAGNOSIS — M6281 Muscle weakness (generalized): Secondary | ICD-10-CM | POA: Diagnosis not present

## 2016-08-06 DIAGNOSIS — R2689 Other abnormalities of gait and mobility: Secondary | ICD-10-CM | POA: Diagnosis not present

## 2016-08-06 DIAGNOSIS — G546 Phantom limb syndrome with pain: Secondary | ICD-10-CM | POA: Diagnosis not present

## 2016-08-06 DIAGNOSIS — M25651 Stiffness of right hip, not elsewhere classified: Secondary | ICD-10-CM | POA: Diagnosis not present

## 2016-08-06 DIAGNOSIS — R293 Abnormal posture: Secondary | ICD-10-CM | POA: Diagnosis not present

## 2016-08-06 DIAGNOSIS — R5383 Other fatigue: Secondary | ICD-10-CM | POA: Diagnosis not present

## 2016-08-06 DIAGNOSIS — Z89612 Acquired absence of left leg above knee: Secondary | ICD-10-CM | POA: Diagnosis not present

## 2016-08-06 DIAGNOSIS — M25652 Stiffness of left hip, not elsewhere classified: Secondary | ICD-10-CM | POA: Diagnosis not present

## 2016-08-07 DIAGNOSIS — Z89611 Acquired absence of right leg above knee: Secondary | ICD-10-CM | POA: Diagnosis not present

## 2016-08-07 DIAGNOSIS — I739 Peripheral vascular disease, unspecified: Secondary | ICD-10-CM | POA: Diagnosis not present

## 2016-08-07 DIAGNOSIS — I5020 Unspecified systolic (congestive) heart failure: Secondary | ICD-10-CM | POA: Diagnosis not present

## 2016-08-07 DIAGNOSIS — R269 Unspecified abnormalities of gait and mobility: Secondary | ICD-10-CM | POA: Diagnosis not present

## 2016-08-07 DIAGNOSIS — Z8673 Personal history of transient ischemic attack (TIA), and cerebral infarction without residual deficits: Secondary | ICD-10-CM | POA: Diagnosis not present

## 2016-08-11 DIAGNOSIS — Z8673 Personal history of transient ischemic attack (TIA), and cerebral infarction without residual deficits: Secondary | ICD-10-CM | POA: Diagnosis not present

## 2016-08-11 DIAGNOSIS — K862 Cyst of pancreas: Secondary | ICD-10-CM | POA: Diagnosis not present

## 2016-08-11 DIAGNOSIS — J449 Chronic obstructive pulmonary disease, unspecified: Secondary | ICD-10-CM | POA: Diagnosis not present

## 2016-08-11 DIAGNOSIS — S20211A Contusion of right front wall of thorax, initial encounter: Secondary | ICD-10-CM | POA: Diagnosis not present

## 2016-08-11 DIAGNOSIS — S20219A Contusion of unspecified front wall of thorax, initial encounter: Secondary | ICD-10-CM | POA: Diagnosis not present

## 2016-08-11 DIAGNOSIS — R791 Abnormal coagulation profile: Secondary | ICD-10-CM | POA: Diagnosis not present

## 2016-08-11 DIAGNOSIS — R195 Other fecal abnormalities: Secondary | ICD-10-CM | POA: Diagnosis not present

## 2016-08-11 DIAGNOSIS — R296 Repeated falls: Secondary | ICD-10-CM | POA: Diagnosis not present

## 2016-08-11 DIAGNOSIS — W19XXXA Unspecified fall, initial encounter: Secondary | ICD-10-CM | POA: Diagnosis not present

## 2016-08-11 DIAGNOSIS — Z79899 Other long term (current) drug therapy: Secondary | ICD-10-CM | POA: Diagnosis not present

## 2016-08-11 DIAGNOSIS — R0781 Pleurodynia: Secondary | ICD-10-CM | POA: Diagnosis not present

## 2016-08-11 DIAGNOSIS — I509 Heart failure, unspecified: Secondary | ICD-10-CM | POA: Diagnosis not present

## 2016-08-11 DIAGNOSIS — I10 Essential (primary) hypertension: Secondary | ICD-10-CM | POA: Diagnosis not present

## 2016-08-11 DIAGNOSIS — W050XXA Fall from non-moving wheelchair, initial encounter: Secondary | ICD-10-CM | POA: Diagnosis not present

## 2016-08-12 DIAGNOSIS — K869 Disease of pancreas, unspecified: Secondary | ICD-10-CM | POA: Diagnosis not present

## 2016-08-12 DIAGNOSIS — E785 Hyperlipidemia, unspecified: Secondary | ICD-10-CM | POA: Diagnosis not present

## 2016-08-12 DIAGNOSIS — M549 Dorsalgia, unspecified: Secondary | ICD-10-CM | POA: Diagnosis not present

## 2016-08-12 DIAGNOSIS — R195 Other fecal abnormalities: Secondary | ICD-10-CM | POA: Diagnosis not present

## 2016-08-12 DIAGNOSIS — R791 Abnormal coagulation profile: Secondary | ICD-10-CM | POA: Diagnosis not present

## 2016-08-12 DIAGNOSIS — W19XXXA Unspecified fall, initial encounter: Secondary | ICD-10-CM | POA: Diagnosis not present

## 2016-08-12 DIAGNOSIS — S20219A Contusion of unspecified front wall of thorax, initial encounter: Secondary | ICD-10-CM | POA: Diagnosis not present

## 2016-08-12 DIAGNOSIS — K862 Cyst of pancreas: Secondary | ICD-10-CM | POA: Diagnosis not present

## 2016-08-13 DIAGNOSIS — Z4781 Encounter for orthopedic aftercare following surgical amputation: Secondary | ICD-10-CM | POA: Diagnosis not present

## 2016-08-13 DIAGNOSIS — R269 Unspecified abnormalities of gait and mobility: Secondary | ICD-10-CM | POA: Diagnosis not present

## 2016-08-13 DIAGNOSIS — Z8673 Personal history of transient ischemic attack (TIA), and cerebral infarction without residual deficits: Secondary | ICD-10-CM | POA: Diagnosis not present

## 2016-08-13 DIAGNOSIS — E784 Other hyperlipidemia: Secondary | ICD-10-CM | POA: Diagnosis not present

## 2016-08-13 DIAGNOSIS — I2510 Atherosclerotic heart disease of native coronary artery without angina pectoris: Secondary | ICD-10-CM | POA: Diagnosis not present

## 2016-08-13 DIAGNOSIS — Z89611 Acquired absence of right leg above knee: Secondary | ICD-10-CM | POA: Diagnosis not present

## 2016-08-13 DIAGNOSIS — I779 Disorder of arteries and arterioles, unspecified: Secondary | ICD-10-CM | POA: Diagnosis not present

## 2016-08-13 DIAGNOSIS — I739 Peripheral vascular disease, unspecified: Secondary | ICD-10-CM | POA: Diagnosis not present

## 2016-08-13 DIAGNOSIS — I5020 Unspecified systolic (congestive) heart failure: Secondary | ICD-10-CM | POA: Diagnosis not present

## 2016-08-13 DIAGNOSIS — Z89612 Acquired absence of left leg above knee: Secondary | ICD-10-CM | POA: Diagnosis not present

## 2016-08-15 DIAGNOSIS — Z7901 Long term (current) use of anticoagulants: Secondary | ICD-10-CM | POA: Diagnosis not present

## 2016-08-22 DIAGNOSIS — K869 Disease of pancreas, unspecified: Secondary | ICD-10-CM | POA: Diagnosis not present

## 2016-08-22 DIAGNOSIS — K625 Hemorrhage of anus and rectum: Secondary | ICD-10-CM | POA: Diagnosis not present

## 2016-08-22 DIAGNOSIS — Z7901 Long term (current) use of anticoagulants: Secondary | ICD-10-CM | POA: Diagnosis not present

## 2016-09-05 DIAGNOSIS — S61412A Laceration without foreign body of left hand, initial encounter: Secondary | ICD-10-CM | POA: Diagnosis not present

## 2016-09-05 DIAGNOSIS — Z23 Encounter for immunization: Secondary | ICD-10-CM | POA: Diagnosis not present

## 2016-09-05 DIAGNOSIS — Z7901 Long term (current) use of anticoagulants: Secondary | ICD-10-CM | POA: Diagnosis not present

## 2016-09-08 DIAGNOSIS — K862 Cyst of pancreas: Secondary | ICD-10-CM | POA: Diagnosis not present

## 2016-09-08 DIAGNOSIS — R195 Other fecal abnormalities: Secondary | ICD-10-CM | POA: Diagnosis not present

## 2016-09-09 DIAGNOSIS — S61412A Laceration without foreign body of left hand, initial encounter: Secondary | ICD-10-CM | POA: Diagnosis not present

## 2016-09-12 DIAGNOSIS — Z89611 Acquired absence of right leg above knee: Secondary | ICD-10-CM | POA: Diagnosis not present

## 2016-09-12 DIAGNOSIS — Z8673 Personal history of transient ischemic attack (TIA), and cerebral infarction without residual deficits: Secondary | ICD-10-CM | POA: Diagnosis not present

## 2016-09-12 DIAGNOSIS — Z89612 Acquired absence of left leg above knee: Secondary | ICD-10-CM | POA: Diagnosis not present

## 2016-09-12 DIAGNOSIS — I5020 Unspecified systolic (congestive) heart failure: Secondary | ICD-10-CM | POA: Diagnosis not present

## 2016-09-12 DIAGNOSIS — I739 Peripheral vascular disease, unspecified: Secondary | ICD-10-CM | POA: Diagnosis not present

## 2016-09-12 DIAGNOSIS — I779 Disorder of arteries and arterioles, unspecified: Secondary | ICD-10-CM | POA: Diagnosis not present

## 2016-09-12 DIAGNOSIS — D510 Vitamin B12 deficiency anemia due to intrinsic factor deficiency: Secondary | ICD-10-CM | POA: Diagnosis not present

## 2016-09-12 DIAGNOSIS — Z7901 Long term (current) use of anticoagulants: Secondary | ICD-10-CM | POA: Diagnosis not present

## 2016-09-12 DIAGNOSIS — Z4781 Encounter for orthopedic aftercare following surgical amputation: Secondary | ICD-10-CM | POA: Diagnosis not present

## 2016-09-12 DIAGNOSIS — R269 Unspecified abnormalities of gait and mobility: Secondary | ICD-10-CM | POA: Diagnosis not present

## 2016-09-30 DIAGNOSIS — Z7901 Long term (current) use of anticoagulants: Secondary | ICD-10-CM | POA: Diagnosis not present

## 2016-10-13 DIAGNOSIS — I5020 Unspecified systolic (congestive) heart failure: Secondary | ICD-10-CM | POA: Diagnosis not present

## 2016-10-13 DIAGNOSIS — Z89612 Acquired absence of left leg above knee: Secondary | ICD-10-CM | POA: Diagnosis not present

## 2016-10-13 DIAGNOSIS — Z4781 Encounter for orthopedic aftercare following surgical amputation: Secondary | ICD-10-CM | POA: Diagnosis not present

## 2016-10-13 DIAGNOSIS — Z8673 Personal history of transient ischemic attack (TIA), and cerebral infarction without residual deficits: Secondary | ICD-10-CM | POA: Diagnosis not present

## 2016-10-13 DIAGNOSIS — I739 Peripheral vascular disease, unspecified: Secondary | ICD-10-CM | POA: Diagnosis not present

## 2016-10-13 DIAGNOSIS — Z89611 Acquired absence of right leg above knee: Secondary | ICD-10-CM | POA: Diagnosis not present

## 2016-10-13 DIAGNOSIS — R269 Unspecified abnormalities of gait and mobility: Secondary | ICD-10-CM | POA: Diagnosis not present

## 2016-10-13 DIAGNOSIS — I779 Disorder of arteries and arterioles, unspecified: Secondary | ICD-10-CM | POA: Diagnosis not present

## 2016-10-15 DIAGNOSIS — R195 Other fecal abnormalities: Secondary | ICD-10-CM | POA: Diagnosis not present

## 2016-10-15 DIAGNOSIS — K862 Cyst of pancreas: Secondary | ICD-10-CM | POA: Diagnosis not present

## 2016-10-15 DIAGNOSIS — K5289 Other specified noninfective gastroenteritis and colitis: Secondary | ICD-10-CM | POA: Diagnosis not present

## 2016-10-19 IMAGING — CT CT HEAD W/O CM
2 series · 15 of 30 positions shown, 19 images · non-contrast
Comparison: 11/02/2014

CLINICAL DATA: Altered mental status.  Hallucinations.

EXAM:
CT HEAD WITHOUT CONTRAST
TECHNIQUE: Contiguous axial images were obtained from the base of the skull
through the vertex without intravenous contrast.

[Series 201: head w/o, idose (1) · axial · non-contrast · 0.38mm/px · z∈[+56,+196]mm · 13 of 34 slices shown, 17 images]
[im 3/34  brain]
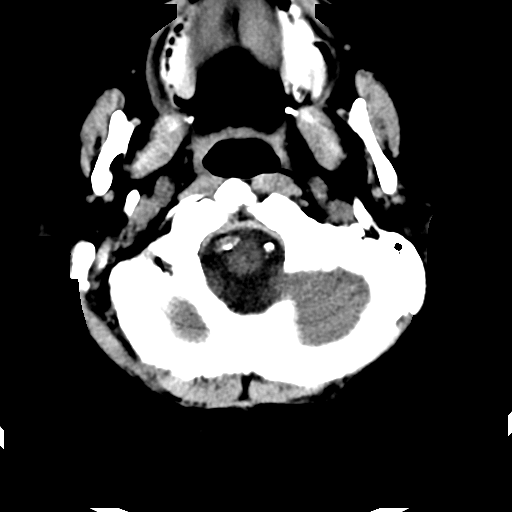
[im 3/34  bone]
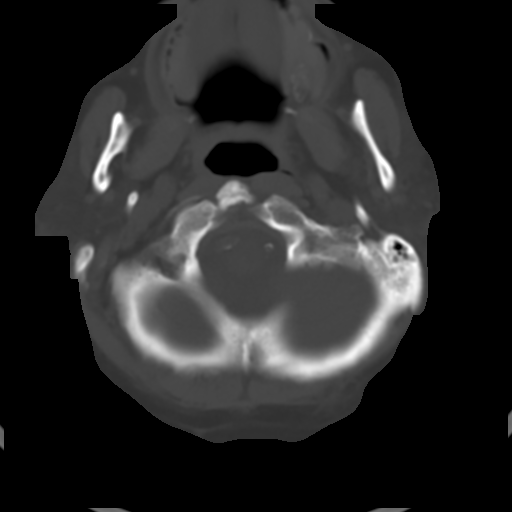
[im 5/34  brain]
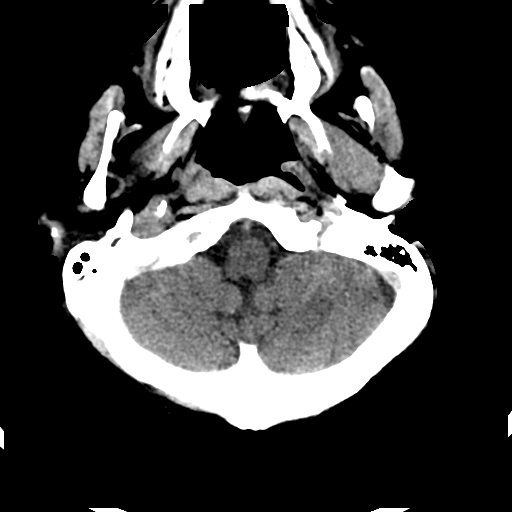
[im 8/34  brain]
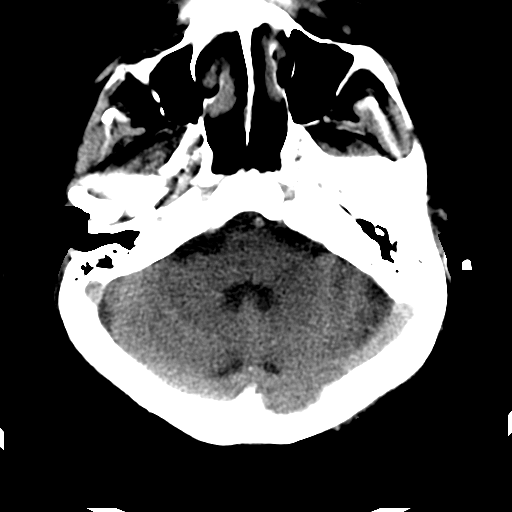
[im 10/34  brain]
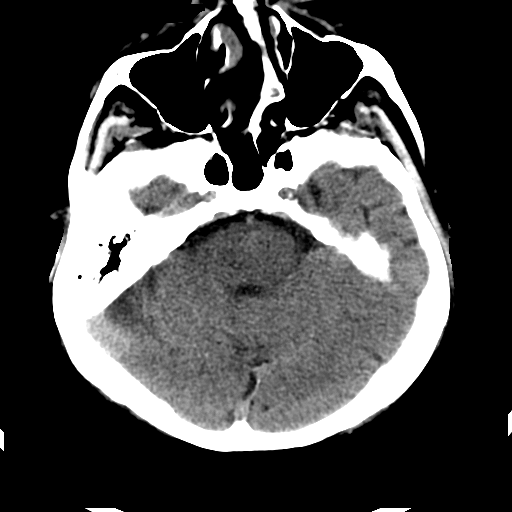
[im 12/34  brain]
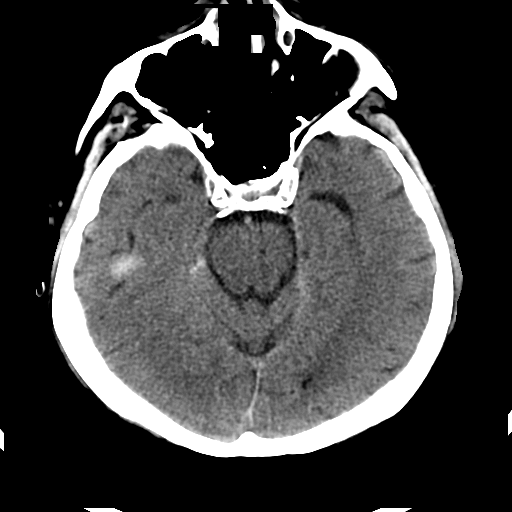
[im 12/34  bone]
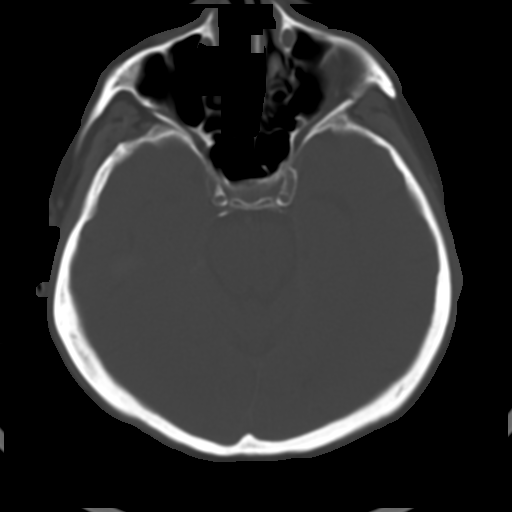
[im 15/34  brain]
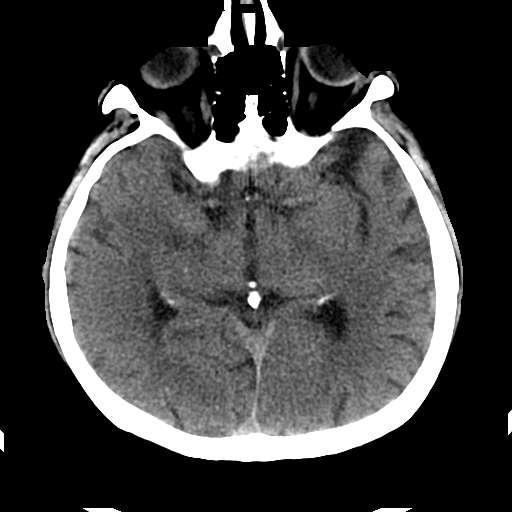
[im 17/34  brain]
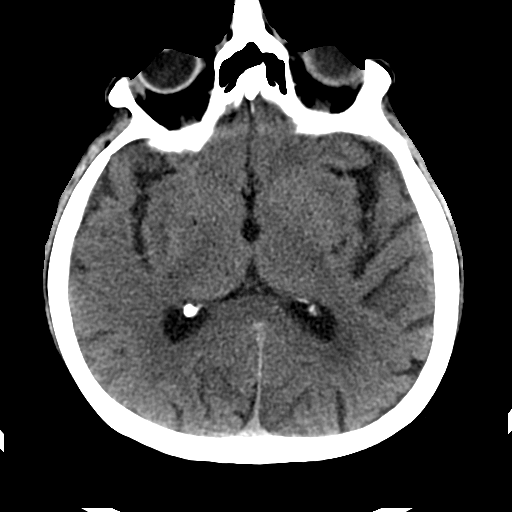
[im 19/34  brain]
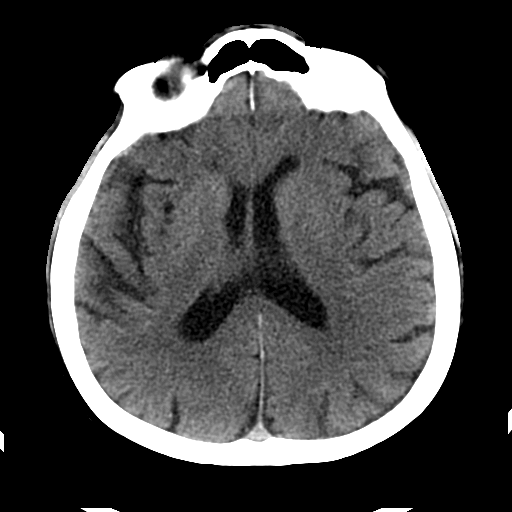
[im 22/34  brain]
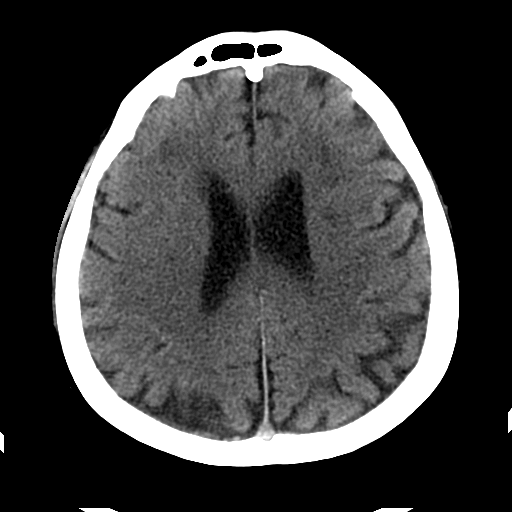
[im 22/34  bone]
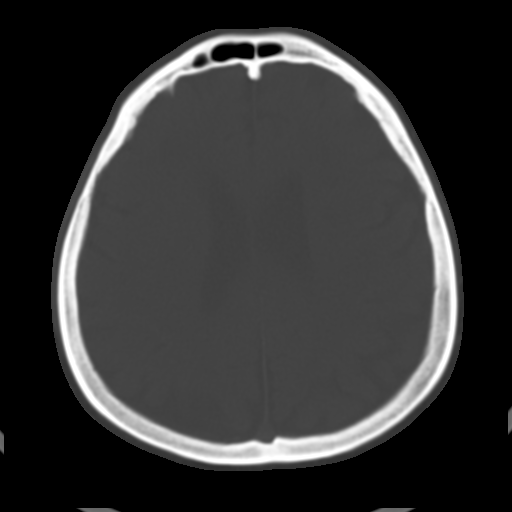
[im 24/34  brain]
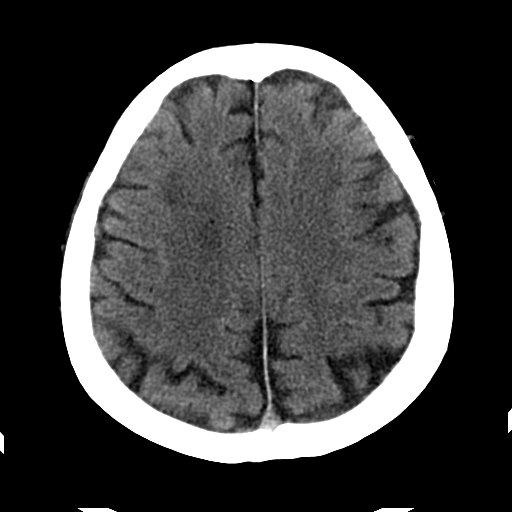
[im 26/34  brain]
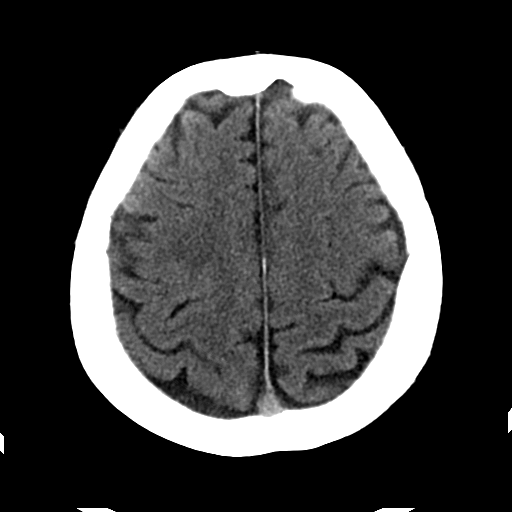
[im 29/34  brain]
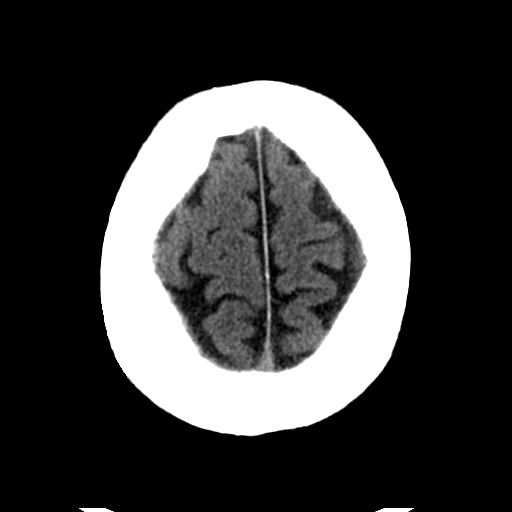
[im 31/34  brain]
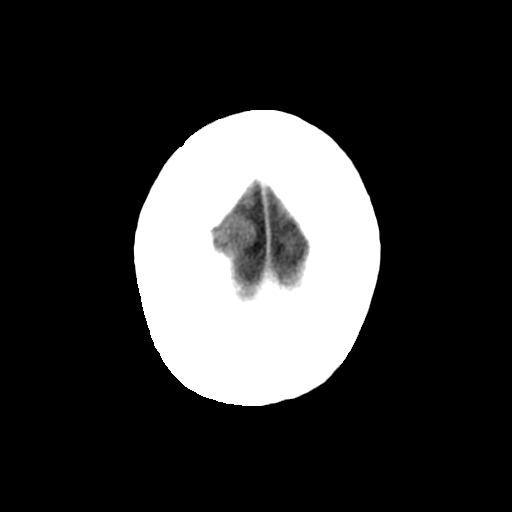
[im 31/34  bone]
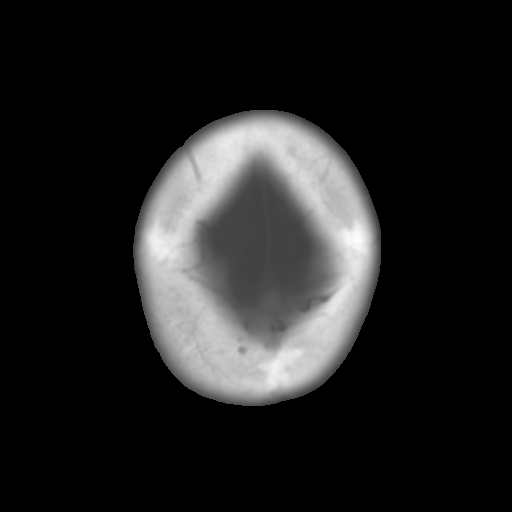

[Series 202: head w/o bone, idose (1) · axial · non-contrast · 0.38mm/px · z∈[+56,+81]mm · 2 of 34 slices shown]
[im 3/34  bone]
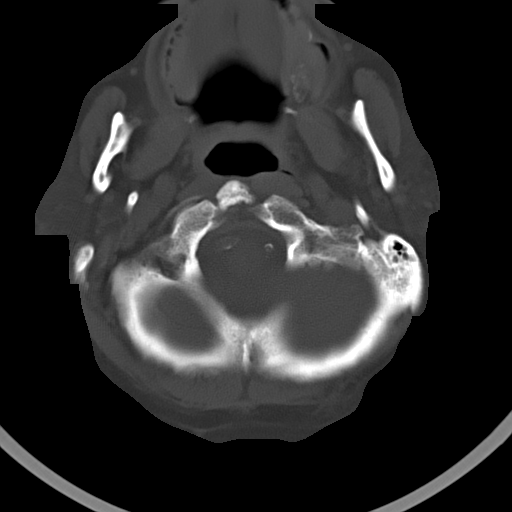
[im 8/34  bone]
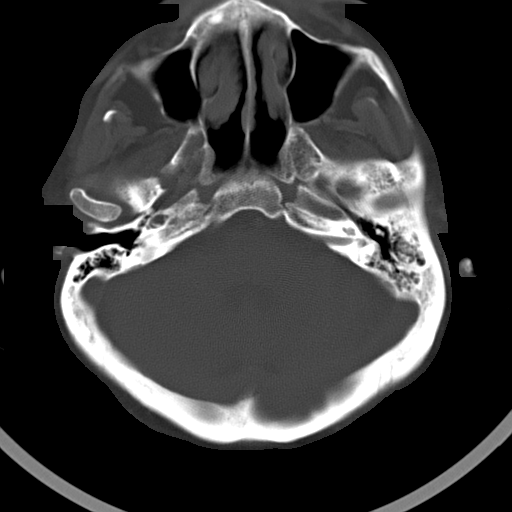

[15 of 30 positions shown; findings below may reference images not displayed]

FINDINGS: There is no intracranial hemorrhage, mass or evidence of acute
infarction. There is no extra-axial fluid collection. There are
patchy white matter hypodensities in both cerebral hemispheres,
unchanged from 11/02/2014 and likely due to small vessel disease.
Probable remote lacunar infarction in the right pons, also
unchanged. No acute findings are evident. Mild generalized atrophy
is stable.
IMPRESSION: No acute findings. There is unchanged generalized atrophy, patchy
small vessel disease, and remote right pontine lacunar infarction.
Critical Value/emergent results were called by telephone at the time
of interpretation on 05/28/2015 at [DATE] to neuro Dr. Edwin Ivan, who
verbally acknowledged these results.

## 2016-10-20 DIAGNOSIS — Z7901 Long term (current) use of anticoagulants: Secondary | ICD-10-CM | POA: Diagnosis not present

## 2016-10-29 DIAGNOSIS — Z79899 Other long term (current) drug therapy: Secondary | ICD-10-CM | POA: Diagnosis not present

## 2016-10-29 DIAGNOSIS — Z7902 Long term (current) use of antithrombotics/antiplatelets: Secondary | ICD-10-CM | POA: Diagnosis not present

## 2016-10-29 DIAGNOSIS — Z888 Allergy status to other drugs, medicaments and biological substances status: Secondary | ICD-10-CM | POA: Diagnosis not present

## 2016-10-29 DIAGNOSIS — Z8673 Personal history of transient ischemic attack (TIA), and cerebral infarction without residual deficits: Secondary | ICD-10-CM | POA: Diagnosis not present

## 2016-10-29 DIAGNOSIS — Z885 Allergy status to narcotic agent status: Secondary | ICD-10-CM | POA: Diagnosis not present

## 2016-10-29 DIAGNOSIS — E7800 Pure hypercholesterolemia, unspecified: Secondary | ICD-10-CM | POA: Diagnosis not present

## 2016-10-29 DIAGNOSIS — I110 Hypertensive heart disease with heart failure: Secondary | ICD-10-CM | POA: Diagnosis not present

## 2016-10-29 DIAGNOSIS — Z886 Allergy status to analgesic agent status: Secondary | ICD-10-CM | POA: Diagnosis not present

## 2016-10-29 DIAGNOSIS — G546 Phantom limb syndrome with pain: Secondary | ICD-10-CM | POA: Diagnosis not present

## 2016-10-29 DIAGNOSIS — Z7982 Long term (current) use of aspirin: Secondary | ICD-10-CM | POA: Diagnosis not present

## 2016-10-29 DIAGNOSIS — Z87891 Personal history of nicotine dependence: Secondary | ICD-10-CM | POA: Diagnosis not present

## 2016-10-29 DIAGNOSIS — I5021 Acute systolic (congestive) heart failure: Secondary | ICD-10-CM | POA: Diagnosis not present

## 2016-10-29 DIAGNOSIS — I252 Old myocardial infarction: Secondary | ICD-10-CM | POA: Diagnosis not present

## 2016-10-29 DIAGNOSIS — Z89611 Acquired absence of right leg above knee: Secondary | ICD-10-CM | POA: Diagnosis not present

## 2016-10-29 DIAGNOSIS — I2510 Atherosclerotic heart disease of native coronary artery without angina pectoris: Secondary | ICD-10-CM | POA: Diagnosis not present

## 2016-10-29 DIAGNOSIS — Z89612 Acquired absence of left leg above knee: Secondary | ICD-10-CM | POA: Diagnosis not present

## 2016-11-03 DIAGNOSIS — Z7901 Long term (current) use of anticoagulants: Secondary | ICD-10-CM | POA: Diagnosis not present

## 2016-11-13 DIAGNOSIS — Z8673 Personal history of transient ischemic attack (TIA), and cerebral infarction without residual deficits: Secondary | ICD-10-CM | POA: Diagnosis not present

## 2016-11-13 DIAGNOSIS — I739 Peripheral vascular disease, unspecified: Secondary | ICD-10-CM | POA: Diagnosis not present

## 2016-11-13 DIAGNOSIS — R269 Unspecified abnormalities of gait and mobility: Secondary | ICD-10-CM | POA: Diagnosis not present

## 2016-11-13 DIAGNOSIS — I5020 Unspecified systolic (congestive) heart failure: Secondary | ICD-10-CM | POA: Diagnosis not present

## 2016-11-13 DIAGNOSIS — Z89611 Acquired absence of right leg above knee: Secondary | ICD-10-CM | POA: Diagnosis not present

## 2016-11-13 DIAGNOSIS — Z89612 Acquired absence of left leg above knee: Secondary | ICD-10-CM | POA: Diagnosis not present

## 2016-11-13 DIAGNOSIS — Z4781 Encounter for orthopedic aftercare following surgical amputation: Secondary | ICD-10-CM | POA: Diagnosis not present

## 2016-11-13 DIAGNOSIS — I779 Disorder of arteries and arterioles, unspecified: Secondary | ICD-10-CM | POA: Diagnosis not present

## 2016-11-24 DIAGNOSIS — Z89611 Acquired absence of right leg above knee: Secondary | ICD-10-CM | POA: Diagnosis not present

## 2016-11-24 DIAGNOSIS — Z7409 Other reduced mobility: Secondary | ICD-10-CM | POA: Diagnosis not present

## 2016-11-24 DIAGNOSIS — Z89612 Acquired absence of left leg above knee: Secondary | ICD-10-CM | POA: Diagnosis not present

## 2016-12-01 DIAGNOSIS — Z7901 Long term (current) use of anticoagulants: Secondary | ICD-10-CM | POA: Diagnosis not present

## 2016-12-01 DIAGNOSIS — Z23 Encounter for immunization: Secondary | ICD-10-CM | POA: Diagnosis not present

## 2016-12-13 DIAGNOSIS — Z89612 Acquired absence of left leg above knee: Secondary | ICD-10-CM | POA: Diagnosis not present

## 2016-12-13 DIAGNOSIS — I5020 Unspecified systolic (congestive) heart failure: Secondary | ICD-10-CM | POA: Diagnosis not present

## 2016-12-13 DIAGNOSIS — Z4781 Encounter for orthopedic aftercare following surgical amputation: Secondary | ICD-10-CM | POA: Diagnosis not present

## 2016-12-13 DIAGNOSIS — Z8673 Personal history of transient ischemic attack (TIA), and cerebral infarction without residual deficits: Secondary | ICD-10-CM | POA: Diagnosis not present

## 2016-12-13 DIAGNOSIS — I739 Peripheral vascular disease, unspecified: Secondary | ICD-10-CM | POA: Diagnosis not present

## 2016-12-13 DIAGNOSIS — R269 Unspecified abnormalities of gait and mobility: Secondary | ICD-10-CM | POA: Diagnosis not present

## 2016-12-13 DIAGNOSIS — I779 Disorder of arteries and arterioles, unspecified: Secondary | ICD-10-CM | POA: Diagnosis not present

## 2016-12-13 DIAGNOSIS — Z89611 Acquired absence of right leg above knee: Secondary | ICD-10-CM | POA: Diagnosis not present

## 2017-01-13 DIAGNOSIS — Z8673 Personal history of transient ischemic attack (TIA), and cerebral infarction without residual deficits: Secondary | ICD-10-CM | POA: Diagnosis not present

## 2017-01-13 DIAGNOSIS — Z89612 Acquired absence of left leg above knee: Secondary | ICD-10-CM | POA: Diagnosis not present

## 2017-01-13 DIAGNOSIS — I739 Peripheral vascular disease, unspecified: Secondary | ICD-10-CM | POA: Diagnosis not present

## 2017-01-13 DIAGNOSIS — Z4781 Encounter for orthopedic aftercare following surgical amputation: Secondary | ICD-10-CM | POA: Diagnosis not present

## 2017-01-13 DIAGNOSIS — Z89611 Acquired absence of right leg above knee: Secondary | ICD-10-CM | POA: Diagnosis not present

## 2017-01-13 DIAGNOSIS — I5020 Unspecified systolic (congestive) heart failure: Secondary | ICD-10-CM | POA: Diagnosis not present

## 2017-01-13 DIAGNOSIS — R269 Unspecified abnormalities of gait and mobility: Secondary | ICD-10-CM | POA: Diagnosis not present

## 2017-01-13 DIAGNOSIS — I779 Disorder of arteries and arterioles, unspecified: Secondary | ICD-10-CM | POA: Diagnosis not present

## 2017-01-28 DIAGNOSIS — Z89611 Acquired absence of right leg above knee: Secondary | ICD-10-CM | POA: Diagnosis not present

## 2017-01-28 DIAGNOSIS — G546 Phantom limb syndrome with pain: Secondary | ICD-10-CM | POA: Diagnosis not present

## 2017-01-28 DIAGNOSIS — Z993 Dependence on wheelchair: Secondary | ICD-10-CM | POA: Diagnosis not present

## 2017-01-28 DIAGNOSIS — Z9181 History of falling: Secondary | ICD-10-CM | POA: Diagnosis not present

## 2017-01-28 DIAGNOSIS — Z89612 Acquired absence of left leg above knee: Secondary | ICD-10-CM | POA: Diagnosis not present

## 2017-02-10 DIAGNOSIS — S61401A Unspecified open wound of right hand, initial encounter: Secondary | ICD-10-CM | POA: Diagnosis not present

## 2017-02-12 DIAGNOSIS — Z4781 Encounter for orthopedic aftercare following surgical amputation: Secondary | ICD-10-CM | POA: Diagnosis not present

## 2017-02-12 DIAGNOSIS — I739 Peripheral vascular disease, unspecified: Secondary | ICD-10-CM | POA: Diagnosis not present

## 2017-02-12 DIAGNOSIS — I779 Disorder of arteries and arterioles, unspecified: Secondary | ICD-10-CM | POA: Diagnosis not present

## 2017-02-12 DIAGNOSIS — Z89611 Acquired absence of right leg above knee: Secondary | ICD-10-CM | POA: Diagnosis not present

## 2017-02-12 DIAGNOSIS — I5020 Unspecified systolic (congestive) heart failure: Secondary | ICD-10-CM | POA: Diagnosis not present

## 2017-02-12 DIAGNOSIS — Z8673 Personal history of transient ischemic attack (TIA), and cerebral infarction without residual deficits: Secondary | ICD-10-CM | POA: Diagnosis not present

## 2017-02-12 DIAGNOSIS — R269 Unspecified abnormalities of gait and mobility: Secondary | ICD-10-CM | POA: Diagnosis not present

## 2017-02-12 DIAGNOSIS — Z89612 Acquired absence of left leg above knee: Secondary | ICD-10-CM | POA: Diagnosis not present

## 2017-03-15 DIAGNOSIS — Z89612 Acquired absence of left leg above knee: Secondary | ICD-10-CM | POA: Diagnosis not present

## 2017-03-15 DIAGNOSIS — I739 Peripheral vascular disease, unspecified: Secondary | ICD-10-CM | POA: Diagnosis not present

## 2017-03-15 DIAGNOSIS — I779 Disorder of arteries and arterioles, unspecified: Secondary | ICD-10-CM | POA: Diagnosis not present

## 2017-03-15 DIAGNOSIS — Z89611 Acquired absence of right leg above knee: Secondary | ICD-10-CM | POA: Diagnosis not present

## 2017-03-15 DIAGNOSIS — Z8673 Personal history of transient ischemic attack (TIA), and cerebral infarction without residual deficits: Secondary | ICD-10-CM | POA: Diagnosis not present

## 2017-03-15 DIAGNOSIS — R269 Unspecified abnormalities of gait and mobility: Secondary | ICD-10-CM | POA: Diagnosis not present

## 2017-03-15 DIAGNOSIS — I5020 Unspecified systolic (congestive) heart failure: Secondary | ICD-10-CM | POA: Diagnosis not present

## 2017-03-15 DIAGNOSIS — Z4781 Encounter for orthopedic aftercare following surgical amputation: Secondary | ICD-10-CM | POA: Diagnosis not present

## 2017-04-15 DIAGNOSIS — Z89612 Acquired absence of left leg above knee: Secondary | ICD-10-CM | POA: Diagnosis not present

## 2017-04-15 DIAGNOSIS — I5020 Unspecified systolic (congestive) heart failure: Secondary | ICD-10-CM | POA: Diagnosis not present

## 2017-04-15 DIAGNOSIS — R269 Unspecified abnormalities of gait and mobility: Secondary | ICD-10-CM | POA: Diagnosis not present

## 2017-04-15 DIAGNOSIS — I779 Disorder of arteries and arterioles, unspecified: Secondary | ICD-10-CM | POA: Diagnosis not present

## 2017-04-15 DIAGNOSIS — I739 Peripheral vascular disease, unspecified: Secondary | ICD-10-CM | POA: Diagnosis not present

## 2017-04-15 DIAGNOSIS — Z89611 Acquired absence of right leg above knee: Secondary | ICD-10-CM | POA: Diagnosis not present

## 2017-04-15 DIAGNOSIS — Z4781 Encounter for orthopedic aftercare following surgical amputation: Secondary | ICD-10-CM | POA: Diagnosis not present

## 2017-04-15 DIAGNOSIS — Z8673 Personal history of transient ischemic attack (TIA), and cerebral infarction without residual deficits: Secondary | ICD-10-CM | POA: Diagnosis not present

## 2017-04-16 DIAGNOSIS — Z89611 Acquired absence of right leg above knee: Secondary | ICD-10-CM | POA: Diagnosis not present

## 2017-04-16 DIAGNOSIS — Z89612 Acquired absence of left leg above knee: Secondary | ICD-10-CM | POA: Diagnosis not present

## 2017-04-19 DIAGNOSIS — M5441 Lumbago with sciatica, right side: Secondary | ICD-10-CM | POA: Diagnosis not present

## 2017-04-19 DIAGNOSIS — I743 Embolism and thrombosis of arteries of the lower extremities: Secondary | ICD-10-CM | POA: Diagnosis not present

## 2017-04-19 DIAGNOSIS — M549 Dorsalgia, unspecified: Secondary | ICD-10-CM | POA: Diagnosis not present

## 2017-04-19 DIAGNOSIS — I70208 Unspecified atherosclerosis of native arteries of extremities, other extremity: Secondary | ICD-10-CM | POA: Diagnosis not present

## 2017-04-19 DIAGNOSIS — K862 Cyst of pancreas: Secondary | ICD-10-CM | POA: Diagnosis not present

## 2017-04-19 DIAGNOSIS — M79651 Pain in right thigh: Secondary | ICD-10-CM | POA: Diagnosis not present

## 2017-04-21 DIAGNOSIS — I70201 Unspecified atherosclerosis of native arteries of extremities, right leg: Secondary | ICD-10-CM | POA: Diagnosis not present

## 2017-04-21 DIAGNOSIS — I779 Disorder of arteries and arterioles, unspecified: Secondary | ICD-10-CM | POA: Diagnosis not present

## 2017-04-23 DIAGNOSIS — M47816 Spondylosis without myelopathy or radiculopathy, lumbar region: Secondary | ICD-10-CM | POA: Diagnosis not present

## 2017-04-24 DIAGNOSIS — M47816 Spondylosis without myelopathy or radiculopathy, lumbar region: Secondary | ICD-10-CM | POA: Insufficient documentation

## 2017-04-24 DIAGNOSIS — G629 Polyneuropathy, unspecified: Secondary | ICD-10-CM

## 2017-04-24 DIAGNOSIS — E7800 Pure hypercholesterolemia, unspecified: Secondary | ICD-10-CM

## 2017-04-24 HISTORY — DX: Spondylosis without myelopathy or radiculopathy, lumbar region: M47.816

## 2017-04-24 HISTORY — DX: Pure hypercholesterolemia, unspecified: E78.00

## 2017-04-24 HISTORY — DX: Polyneuropathy, unspecified: G62.9

## 2017-04-26 DIAGNOSIS — Z7901 Long term (current) use of anticoagulants: Secondary | ICD-10-CM | POA: Insufficient documentation

## 2017-04-26 DIAGNOSIS — Z01810 Encounter for preprocedural cardiovascular examination: Secondary | ICD-10-CM | POA: Insufficient documentation

## 2017-04-26 DIAGNOSIS — I480 Paroxysmal atrial fibrillation: Secondary | ICD-10-CM

## 2017-04-26 HISTORY — DX: Long term (current) use of anticoagulants: Z79.01

## 2017-04-26 HISTORY — DX: Encounter for preprocedural cardiovascular examination: Z01.810

## 2017-04-26 HISTORY — DX: Paroxysmal atrial fibrillation: I48.0

## 2017-04-26 NOTE — Progress Notes (Signed)
Cardiology Office Note:    Date:  04/27/2017   ID:  [name] [name], DOB 08-Apr-1940, MRN [id number]  PCP:  Marylen Ponto, MD  Cardiologist:  Norman Herrlich, MD    Referring MD: Dr Channing Mutters   ASSESSMENT:    1. Preoperative cardiovascular examination   2. Chronic anticoagulation   3. CAD in native artery   4. Chronic combined systolic and diastolic heart failure (HCC)   5. Hypertensive heart disease with chronic combined systolic and diastolic congestive heart failure (HCC)   6. Hyperlipidemia, unspecified hyperlipidemia type   7. PAF (paroxysmal atrial fibrillation) (HCC)    PLAN:     Preoperative cardiovascular evaluation summary Surgeon: Dr Channing Mutters Procedure: Spinal stimulator removal The surgery is elective Active cardiac problems  Anticoagulation, CAD and heart failure.  The cardiac status is stable. The planned procedure is low to intermediate risk risk. The functional capacity is 4 mets or greater  No Recent cardiac tests performed EKG today, SRTH Given the above his overall risk for the planned procedure is acceptable Antiplatelet/ anticoagulant recommendation: Stop clopidogrel 7 days prior to surgery, he was told to stop Eliquis 5 days prior to surgery by dr Channing Mutters.  Please tell him when it safe to resume his antiplatelet and anticoagulant from a bleeding perspective. Other cardiac medication or device recommendation: None Anesthesia recommendation: None Observation, monitoring,and postoperative test recommendation: Monitored bed with telemetry while in hospital with a background history of atrial fibrillation The patient is optimized from a cardiology perspective: Yes  In order of problems listed above:  1. See above proceed with surgery after withdrawal of antiplatelet anticoagulant.  He requires no further preoperative cardiac optimization. 2. Resume antiplatelet and anticoagulant when safe postoperatively from a bleeding perspective 3. Compensated continue current  diuretic 4. Stable continue beta-blocker ACE inhibitor 5. Stable continue with statin 6. Stable he is in sinus rhythm.  If he has perioperative atrial fibrillation please consult cardiology regarding rate control.    Next appointment: 6 months   Medication Adjustments/Labs and Tests Ordered: Current medicines are reviewed at length with the patient today.  Concerns regarding medicines are outlined above.  No orders of the defined types were placed in this encounter.  No orders of the defined types were placed in this encounter.   Chief Complaint  Patient presents with  . Pre-op Exam    to have back/spine stimulator removed    History of Present Illness:    [name] [name] is a 77 y.o. male with a hx of PAD Type 2 NSTEMI, CAD EF 35-40%,CVA, PAF anticoagulated , HIT, hyperlipidemia,  LEFT AKA 05/02/16  last seen by me in April 2018.  He was seen by Dr Channing Mutters 04/23/17: Assessment:  1. Lumbar spondylosis  I think removing his spinal cord stimulator is very reasonable thing to do. He does need an MRI and he can have it with that in place. He has not used it for a number of years. We need to get cardiac clearance to put him to sleep and then we would schedule a date to remove it. Plan:  Preventive:  Counseling: Care goal follow up plan - Above normal BMI follow up - Dietary management education, guidance and counseling.  Screening/Special test: Fall Risk Assessment Screening - no falls in the past year Assessment: performed  Plan of care: documented. Pain Management: Follow up plan documented - Yes MARK Delora Fuel, MD [facility] [address] [place] [address]  Compliance with diet,  lifestyle and medications: Yes  Overall he is made a good recovery he is having back pain as a pancreatic abnormality and is recommended for removal of the spinal cord stimulator.  He has had no chest pain shortness of breath palpitation TIA or TIA or bleeding complication from clopidogrel  and anticoagulant.  Coronary angiography performed January 2018 with stable chronic occlusion of the right coronary artery.  He is in sinus rhythm today his EKG shows ST-T abnormality which is been chronic and similar to 2017. Past Medical History:  Diagnosis Date  . Acute systolic congestive heart failure (HCC) 02/01/2016  . Anemia   . Anxiety   . Arthritis   . Atrial fibrillation (HCC)   . Bell's palsy   . Blood transfusion without reported diagnosis   . CAD in native artery 09/19/2014   Overview:  1.s/p nonQwave MI 2001, PTCA and stent of LAD and subsequently repeat PTCA and stent of 2 lesions in LAD 08-19-00   2. MPS in March 2011 wo ischemia, EF 44%   3. Lexiscan MPS 07/07/11 wo ischemia, normal EF%  . Cardiomyopathy (HCC) 03/26/2016  . Carotid artery disease (HCC) 04/29/2016   Overview:  S/P right CEA  . Carotid artery occlusion   . CHF (congestive heart failure) (HCC)   . COPD (chronic obstructive pulmonary disease) (HCC)   . Coronary artery disease   . Demand ischemia of myocardium (HCC) 01/15/2015  . Depression   . Essential hypertension 09/19/2014  . High cholesterol 04/24/2017  . History of atrial fibrillation 04/29/2016  . HIT (heparin-induced thrombocytopenia) (HCC) 04/29/2016  . Hyperlipidemia   . Hypertension   . Impaired mobility and activities of daily living 05/12/2016  . Intermittent claudication (HCC) 02/24/2012  . Ischemic cardiomyopathy 02/01/2016   Overview:  Added automatically from request for surgery [id number]  . Lumbar spondylosis 04/24/2017  . Myocardial infarction (HCC)    X's 2  . Neuropathy 04/24/2017  . Pain in limb 02/24/2012  . PAOD (peripheral arterial occlusive disease) (HCC) 01/22/2016  . Peripheral vascular disease (HCC)   . Peripheral vascular disease, unspecified (HCC) 02/24/2012  . S/P AKA (above knee amputation) unilateral, left (HCC) 05/12/2016  . Seizures (HCC)   . Sleep apnea   . Stroke [facility])     Past Surgical History:  Procedure Laterality Date  .   stimulator  Aug. 6, 2012   Implantation of Spinal Stimulator  . ANGIOPLASTY  Dec. 2001   with stent  . APPENDECTOMY    . CAROTID ENDARTERECTOMY  Aug. 2002   RIGHT  cea  . Catherization  June 2002   Cardiac  . CHOLECYSTECTOMY     Gall Bladder- llaproscopic  . COLON SURGERY  Jan. 2009   Ischemic  . EYE SURGERY  1949  . FEMORAL BYPASS    . FINGER AMPUTATION  1960   Right  thumb  . FRACTURE SURGERY    . LEG AMPUTATION ABOVE KNEE Right   . ROTATOR CUFF REPAIR  2007   Right  shoulder  . SPINAL FUSION  Sept. 30, 2012  . SPINE SURGERY  Feb. 2001  . THROMBOENDARTERECTOMY      Current Medications: Current Meds  Medication Sig  . acetaminophen (TYLENOL) 325 MG tablet Take 650 mg by mouth every 4 (four) hours as needed.  . cholecalciferol (VITAMIN D) 1000 UNITS tablet Take 1,000 Units by mouth daily. Reported on 02/21/2015  . citalopram (CELEXA) 20 MG tablet Take 20 mg by mouth daily.   . clopidogrel (PLAVIX) 75  MG tablet Take 75 mg by mouth daily.  . Cyanocobalamin (VITAMIN B-12) 5000 MCG TBDP Take 1 tablet by mouth daily.  Marland Kitchen ELIQUIS 5 MG TABS tablet Take 5 mg by mouth 2 (two) times daily.  . ferrous sulfate 325 (65 FE) MG tablet Take 325 mg by mouth daily.   . folic acid (FOLVITE) 1 MG tablet Take 1 mg by mouth daily.  . furosemide (LASIX) 20 MG tablet Take 20 mg by mouth.  . gabapentin (NEURONTIN) 300 MG capsule Take 1,200 mg by mouth 3 (three) times daily.   Marland Kitchen HYDROcodone-acetaminophen (NORCO/VICODIN) 5-325 MG tablet Take 1 tablet by mouth every 4 (four) hours as needed for moderate pain.  Marland Kitchen lidocaine (XYLOCAINE) 5 % ointment Apply 1 application topically as needed.  Marland Kitchen lisinopril (PRINIVIL,ZESTRIL) 5 MG tablet Take 5 mg by mouth daily.  . Multiple Vitamin (MULTIVITAMIN) tablet Take 1 tablet by mouth daily.  . Omega-3 Fatty Acids (FISH OIL) 1200 MG CPDR Take 2 capsules by mouth daily.   . ondansetron (ZOFRAN) 4 MG tablet Take 4 mg by mouth every 8 (eight) hours as needed for  nausea/vomiting.  . tamsulosin (FLOMAX) 0.4 MG CAPS capsule Take 0.4 mg by mouth. Reported on 02/21/2015  . [DISCONTINUED] rosuvastatin (CRESTOR) 20 MG tablet Take 20 mg by mouth daily.     Allergies:   Codeine; Heparin; Percocet [oxycodone-acetaminophen]; Promethazine hcl; Buprenorphine hcl; Morphine and related; Amitriptyline; and Lyrica [pregabalin]   Social History   Socioeconomic History  . Marital status: Married    Spouse name: None  . Number of children: None  . Years of education: None  . Highest education level: None  Social Needs  . Financial resource strain: None  . Food insecurity - worry: None  . Food insecurity - inability: None  . Transportation needs - medical: None  . Transportation needs - non-medical: None  Occupational History  . None  Tobacco Use  . Smoking status: Former Smoker    Types: Cigarettes    Start date: 02/24/2001  . Smokeless tobacco: Never Used  Substance and Sexual Activity  . Alcohol use: No  . Drug use: No  . Sexual activity: None  Other Topics Concern  . None  Social History Narrative  . None     Family History: The patient's family history includes Cancer in his daughter and sister; Deep vein thrombosis in his son; Heart attack in his father and son; Heart disease in his father and son; Hyperlipidemia in his brother, father, and son; Hypertension in his father, mother, sister, and son. ROS:   Please see the history of present illness.    All other systems reviewed and are negative.  EKGs/Labs/Other Studies Reviewed:    The following studies were reviewed today:  EKG:  EKG ordered today.  The ekg ordered today demonstrates [facility] ST and T abnormality similar to 05/28/15  Left heart cath 03/26/16:  Severe calcification fluoroscopically.  Single-vessel coronary artery disease.  No significant aortic valve gradient on catheter pullback.  Maximize medical therapy. CTO RCA - 100% ostial with dense L-R collaterals. 60%  mid LCX  very discrete and calcific, but appears less impressive in some  views LAD with minimal 20-30% lesions, stent patent.  Recent Labs: No results found for requested labs within last 8760 hours.  Recent Lipid Panel No results found for: CHOL, TRIG, HDL, CHOLHDL, VLDL, LDLCALC, LDLDIRECT  Physical Exam:    VS:  BP (!) 152/60 (BP Location: Left Arm, Patient Position: Sitting, Cuff Size: Normal)  Pulse 96   SpO2 97%     Wt Readings from Last 3 Encounters:  05/27/16 143 lb (64.9 kg)  02/26/16 164 lb (74.4 kg)  02/21/15 164 lb (74.4 kg)     GEN:  in no acute distress HEENT: Normal NECK: No JVD; No carotid bruits LYMPHATICS: No lymphadenopathy CARDIAC: RRR, no murmurs, rubs, gallops RESPIRATORY:  Clear to auscultation without rales, wheezing or rhonchi  ABDOMEN: Soft, non-tender, non-distended MUSCULOSKELETAL:  No edema; No deformity  SKIN: Warm and dry NEUROLOGIC:  Alert and oriented x 3 PSYCHIATRIC:  Normal affect    Signed, Norman Herrlich, MD  04/27/2017 3:12 PM    [facility]

## 2017-04-27 ENCOUNTER — Ambulatory Visit (INDEPENDENT_AMBULATORY_CARE_PROVIDER_SITE_OTHER): Payer: PPO | Admitting: Cardiology

## 2017-04-27 ENCOUNTER — Encounter: Payer: Self-pay | Admitting: Cardiology

## 2017-04-27 ENCOUNTER — Other Ambulatory Visit: Payer: Self-pay

## 2017-04-27 VITALS — BP 152/60 | HR 96

## 2017-04-27 DIAGNOSIS — Z01810 Encounter for preprocedural cardiovascular examination: Secondary | ICD-10-CM

## 2017-04-27 DIAGNOSIS — I5042 Chronic combined systolic (congestive) and diastolic (congestive) heart failure: Secondary | ICD-10-CM | POA: Diagnosis not present

## 2017-04-27 DIAGNOSIS — I2510 Atherosclerotic heart disease of native coronary artery without angina pectoris: Secondary | ICD-10-CM | POA: Diagnosis not present

## 2017-04-27 DIAGNOSIS — I110 Hypertensive heart disease with heart failure: Secondary | ICD-10-CM | POA: Diagnosis not present

## 2017-04-27 DIAGNOSIS — Z7901 Long term (current) use of anticoagulants: Secondary | ICD-10-CM | POA: Diagnosis not present

## 2017-04-27 DIAGNOSIS — I480 Paroxysmal atrial fibrillation: Secondary | ICD-10-CM | POA: Diagnosis not present

## 2017-04-27 DIAGNOSIS — E785 Hyperlipidemia, unspecified: Secondary | ICD-10-CM

## 2017-04-27 MED ORDER — ROSUVASTATIN CALCIUM 20 MG PO TABS: 20.0000 mg | ORAL_TABLET | Freq: Every day | ORAL | 1 refills | Status: DC

## 2017-04-27 NOTE — Patient Instructions (Addendum)
  Medication Instructions:  Your physician recommends that you continue on your current medications as directed. Please refer to the Current Medication list given to you today.  Stop clopidogrel 7 days before surgery Stop Eliquis 5 days before surgery   Labwork: None  Testing/Procedures: You had an EKG today.   Follow-Up: Your physician wants you to follow-up in: 6 months. You will receive a reminder letter in the mail two months in advance. If you don't receive a letter, please call our office to schedule the follow-up appointment.   Any Other Special Instructions Will Be Listed Below (If Applicable).     If you need a refill on your cardiac medications before your next appointment, please call your pharmacy.

## 2017-05-04 ENCOUNTER — Telehealth: Payer: Self-pay | Admitting: Cardiology

## 2017-05-04 DIAGNOSIS — G8929 Other chronic pain: Secondary | ICD-10-CM | POA: Diagnosis not present

## 2017-05-04 DIAGNOSIS — M545 Low back pain: Secondary | ICD-10-CM | POA: Diagnosis not present

## 2017-05-04 NOTE — Telephone Encounter (Signed)
Office note sent per Dr. Dulce Sellar.

## 2017-05-04 NOTE — Telephone Encounter (Signed)
Patient needs a note to Dr. Channing Mutters in [place] regarding the approval for removal of a spinal cord stimulator.

## 2017-05-12 DIAGNOSIS — Z0000 Encounter for general adult medical examination without abnormal findings: Secondary | ICD-10-CM | POA: Diagnosis not present

## 2017-05-12 DIAGNOSIS — Z1339 Encounter for screening examination for other mental health and behavioral disorders: Secondary | ICD-10-CM | POA: Diagnosis not present

## 2017-05-12 DIAGNOSIS — M4800 Spinal stenosis, site unspecified: Secondary | ICD-10-CM | POA: Diagnosis not present

## 2017-05-13 DIAGNOSIS — Z4781 Encounter for orthopedic aftercare following surgical amputation: Secondary | ICD-10-CM | POA: Diagnosis not present

## 2017-05-13 DIAGNOSIS — Z89611 Acquired absence of right leg above knee: Secondary | ICD-10-CM | POA: Diagnosis not present

## 2017-05-13 DIAGNOSIS — R269 Unspecified abnormalities of gait and mobility: Secondary | ICD-10-CM | POA: Diagnosis not present

## 2017-05-13 DIAGNOSIS — I779 Disorder of arteries and arterioles, unspecified: Secondary | ICD-10-CM | POA: Diagnosis not present

## 2017-05-13 DIAGNOSIS — Z89612 Acquired absence of left leg above knee: Secondary | ICD-10-CM | POA: Diagnosis not present

## 2017-05-13 DIAGNOSIS — Z8673 Personal history of transient ischemic attack (TIA), and cerebral infarction without residual deficits: Secondary | ICD-10-CM | POA: Diagnosis not present

## 2017-05-13 DIAGNOSIS — I739 Peripheral vascular disease, unspecified: Secondary | ICD-10-CM | POA: Diagnosis not present

## 2017-05-13 DIAGNOSIS — I5020 Unspecified systolic (congestive) heart failure: Secondary | ICD-10-CM | POA: Diagnosis not present

## 2017-05-14 DIAGNOSIS — Z89611 Acquired absence of right leg above knee: Secondary | ICD-10-CM | POA: Diagnosis not present

## 2017-05-14 DIAGNOSIS — Z89612 Acquired absence of left leg above knee: Secondary | ICD-10-CM | POA: Diagnosis not present

## 2017-05-25 DIAGNOSIS — I4891 Unspecified atrial fibrillation: Secondary | ICD-10-CM | POA: Diagnosis not present

## 2017-05-25 DIAGNOSIS — G8929 Other chronic pain: Secondary | ICD-10-CM | POA: Diagnosis not present

## 2017-05-25 DIAGNOSIS — T85193A Other mechanical complication of implanted electronic neurostimulator, generator, initial encounter: Secondary | ICD-10-CM | POA: Diagnosis not present

## 2017-05-25 DIAGNOSIS — T85112A Breakdown (mechanical) of implanted electronic neurostimulator (electrode) of spinal cord, initial encounter: Secondary | ICD-10-CM | POA: Diagnosis not present

## 2017-05-25 DIAGNOSIS — I10 Essential (primary) hypertension: Secondary | ICD-10-CM | POA: Diagnosis not present

## 2017-05-25 DIAGNOSIS — J439 Emphysema, unspecified: Secondary | ICD-10-CM | POA: Diagnosis not present

## 2017-05-25 DIAGNOSIS — Z89611 Acquired absence of right leg above knee: Secondary | ICD-10-CM | POA: Diagnosis not present

## 2017-05-25 DIAGNOSIS — E785 Hyperlipidemia, unspecified: Secondary | ICD-10-CM | POA: Diagnosis not present

## 2017-05-25 DIAGNOSIS — Z7902 Long term (current) use of antithrombotics/antiplatelets: Secondary | ICD-10-CM | POA: Diagnosis not present

## 2017-05-25 DIAGNOSIS — K869 Disease of pancreas, unspecified: Secondary | ICD-10-CM | POA: Diagnosis not present

## 2017-05-25 DIAGNOSIS — Z886 Allergy status to analgesic agent status: Secondary | ICD-10-CM | POA: Diagnosis not present

## 2017-05-25 DIAGNOSIS — M47816 Spondylosis without myelopathy or radiculopathy, lumbar region: Secondary | ICD-10-CM | POA: Diagnosis not present

## 2017-05-25 DIAGNOSIS — Z8673 Personal history of transient ischemic attack (TIA), and cerebral infarction without residual deficits: Secondary | ICD-10-CM | POA: Diagnosis not present

## 2017-05-25 DIAGNOSIS — Z89612 Acquired absence of left leg above knee: Secondary | ICD-10-CM | POA: Diagnosis not present

## 2017-05-25 DIAGNOSIS — Z7901 Long term (current) use of anticoagulants: Secondary | ICD-10-CM | POA: Diagnosis not present

## 2017-05-25 DIAGNOSIS — M1990 Unspecified osteoarthritis, unspecified site: Secondary | ICD-10-CM | POA: Diagnosis not present

## 2017-05-25 DIAGNOSIS — Z7982 Long term (current) use of aspirin: Secondary | ICD-10-CM | POA: Diagnosis not present

## 2017-05-25 DIAGNOSIS — I2510 Atherosclerotic heart disease of native coronary artery without angina pectoris: Secondary | ICD-10-CM | POA: Diagnosis not present

## 2017-05-25 DIAGNOSIS — G629 Polyneuropathy, unspecified: Secondary | ICD-10-CM | POA: Diagnosis not present

## 2017-05-25 DIAGNOSIS — Z79899 Other long term (current) drug therapy: Secondary | ICD-10-CM | POA: Diagnosis not present

## 2017-05-25 DIAGNOSIS — H9190 Unspecified hearing loss, unspecified ear: Secondary | ICD-10-CM | POA: Diagnosis not present

## 2017-05-25 DIAGNOSIS — Z87891 Personal history of nicotine dependence: Secondary | ICD-10-CM | POA: Diagnosis not present

## 2017-05-25 DIAGNOSIS — Z981 Arthrodesis status: Secondary | ICD-10-CM | POA: Diagnosis not present

## 2017-05-25 DIAGNOSIS — I739 Peripheral vascular disease, unspecified: Secondary | ICD-10-CM | POA: Diagnosis not present

## 2017-05-25 DIAGNOSIS — Z888 Allergy status to other drugs, medicaments and biological substances status: Secondary | ICD-10-CM | POA: Diagnosis not present

## 2017-05-25 DIAGNOSIS — F329 Major depressive disorder, single episode, unspecified: Secondary | ICD-10-CM | POA: Diagnosis not present

## 2017-05-25 DIAGNOSIS — G4730 Sleep apnea, unspecified: Secondary | ICD-10-CM | POA: Diagnosis not present

## 2017-05-25 DIAGNOSIS — I252 Old myocardial infarction: Secondary | ICD-10-CM | POA: Diagnosis not present

## 2017-05-25 DIAGNOSIS — N401 Enlarged prostate with lower urinary tract symptoms: Secondary | ICD-10-CM | POA: Diagnosis not present

## 2017-05-25 DIAGNOSIS — E7800 Pure hypercholesterolemia, unspecified: Secondary | ICD-10-CM | POA: Diagnosis not present

## 2017-05-28 ENCOUNTER — Other Ambulatory Visit: Payer: Self-pay | Admitting: *Deleted

## 2017-05-28 NOTE — Patient Outreach (Signed)
[facility]) Care Management  05/28/2017  Alexiz Cordia [facility] 1940-12-19 [id number]   Referral received on 4/4 for transition of care outreach after patient was discharged on 05/26/17 from [facility] on 05/26/17.  No outreach warranted at this time as transition of care will be completed by patient's primary care provider office, [facility], who will refer to [facility] Care Management if needed. This RNCM will close case.  Bary Richard RN,CCM,CDE [facility] Care Management Coordinator Office Phone [phone] Office Fax [phone]

## 2017-06-13 DIAGNOSIS — I5020 Unspecified systolic (congestive) heart failure: Secondary | ICD-10-CM | POA: Diagnosis not present

## 2017-06-13 DIAGNOSIS — R269 Unspecified abnormalities of gait and mobility: Secondary | ICD-10-CM | POA: Diagnosis not present

## 2017-06-13 DIAGNOSIS — I779 Disorder of arteries and arterioles, unspecified: Secondary | ICD-10-CM | POA: Diagnosis not present

## 2017-06-13 DIAGNOSIS — Z89611 Acquired absence of right leg above knee: Secondary | ICD-10-CM | POA: Diagnosis not present

## 2017-06-13 DIAGNOSIS — Z89612 Acquired absence of left leg above knee: Secondary | ICD-10-CM | POA: Diagnosis not present

## 2017-06-13 DIAGNOSIS — Z4781 Encounter for orthopedic aftercare following surgical amputation: Secondary | ICD-10-CM | POA: Diagnosis not present

## 2017-06-13 DIAGNOSIS — I739 Peripheral vascular disease, unspecified: Secondary | ICD-10-CM | POA: Diagnosis not present

## 2017-06-13 DIAGNOSIS — Z8673 Personal history of transient ischemic attack (TIA), and cerebral infarction without residual deficits: Secondary | ICD-10-CM | POA: Diagnosis not present

## 2017-06-14 DIAGNOSIS — Z89612 Acquired absence of left leg above knee: Secondary | ICD-10-CM | POA: Diagnosis not present

## 2017-06-14 DIAGNOSIS — Z89611 Acquired absence of right leg above knee: Secondary | ICD-10-CM | POA: Diagnosis not present

## 2017-06-30 DIAGNOSIS — Z09 Encounter for follow-up examination after completed treatment for conditions other than malignant neoplasm: Secondary | ICD-10-CM | POA: Diagnosis not present

## 2017-06-30 DIAGNOSIS — M47816 Spondylosis without myelopathy or radiculopathy, lumbar region: Secondary | ICD-10-CM | POA: Diagnosis not present

## 2017-07-01 DIAGNOSIS — Z89611 Acquired absence of right leg above knee: Secondary | ICD-10-CM | POA: Diagnosis not present

## 2017-07-01 DIAGNOSIS — Z89612 Acquired absence of left leg above knee: Secondary | ICD-10-CM | POA: Diagnosis not present

## 2017-07-01 DIAGNOSIS — Z4781 Encounter for orthopedic aftercare following surgical amputation: Secondary | ICD-10-CM | POA: Diagnosis not present

## 2017-07-01 DIAGNOSIS — G546 Phantom limb syndrome with pain: Secondary | ICD-10-CM | POA: Diagnosis not present

## 2017-07-14 DIAGNOSIS — Z89611 Acquired absence of right leg above knee: Secondary | ICD-10-CM | POA: Diagnosis not present

## 2017-07-14 DIAGNOSIS — Z89612 Acquired absence of left leg above knee: Secondary | ICD-10-CM | POA: Diagnosis not present

## 2017-08-05 DIAGNOSIS — S88911D Complete traumatic amputation of right lower leg, level unspecified, subsequent encounter: Secondary | ICD-10-CM | POA: Diagnosis not present

## 2017-08-05 DIAGNOSIS — S88912D Complete traumatic amputation of left lower leg, level unspecified, subsequent encounter: Secondary | ICD-10-CM | POA: Diagnosis not present

## 2017-08-05 DIAGNOSIS — I6523 Occlusion and stenosis of bilateral carotid arteries: Secondary | ICD-10-CM | POA: Diagnosis not present

## 2017-08-05 DIAGNOSIS — S88911A Complete traumatic amputation of right lower leg, level unspecified, initial encounter: Secondary | ICD-10-CM

## 2017-08-05 DIAGNOSIS — I779 Disorder of arteries and arterioles, unspecified: Secondary | ICD-10-CM | POA: Diagnosis not present

## 2017-08-05 DIAGNOSIS — S88912A Complete traumatic amputation of left lower leg, level unspecified, initial encounter: Secondary | ICD-10-CM | POA: Insufficient documentation

## 2017-08-05 HISTORY — DX: Complete traumatic amputation of right lower leg, level unspecified, initial encounter: S88.911A

## 2017-08-13 DIAGNOSIS — F329 Major depressive disorder, single episode, unspecified: Secondary | ICD-10-CM | POA: Diagnosis not present

## 2017-08-13 DIAGNOSIS — I10 Essential (primary) hypertension: Secondary | ICD-10-CM | POA: Diagnosis not present

## 2017-08-13 DIAGNOSIS — I2510 Atherosclerotic heart disease of native coronary artery without angina pectoris: Secondary | ICD-10-CM | POA: Diagnosis not present

## 2017-08-14 DIAGNOSIS — F329 Major depressive disorder, single episode, unspecified: Secondary | ICD-10-CM | POA: Diagnosis not present

## 2017-08-14 DIAGNOSIS — Z89612 Acquired absence of left leg above knee: Secondary | ICD-10-CM | POA: Diagnosis not present

## 2017-08-14 DIAGNOSIS — Z89611 Acquired absence of right leg above knee: Secondary | ICD-10-CM | POA: Diagnosis not present

## 2017-08-14 DIAGNOSIS — I2510 Atherosclerotic heart disease of native coronary artery without angina pectoris: Secondary | ICD-10-CM | POA: Diagnosis not present

## 2017-08-14 DIAGNOSIS — I10 Essential (primary) hypertension: Secondary | ICD-10-CM | POA: Diagnosis not present

## 2017-09-13 DIAGNOSIS — Z89612 Acquired absence of left leg above knee: Secondary | ICD-10-CM | POA: Diagnosis not present

## 2017-09-13 DIAGNOSIS — Z89611 Acquired absence of right leg above knee: Secondary | ICD-10-CM | POA: Diagnosis not present

## 2017-09-23 DIAGNOSIS — I10 Essential (primary) hypertension: Secondary | ICD-10-CM | POA: Diagnosis not present

## 2017-09-23 DIAGNOSIS — I2510 Atherosclerotic heart disease of native coronary artery without angina pectoris: Secondary | ICD-10-CM | POA: Diagnosis not present

## 2017-09-23 DIAGNOSIS — F329 Major depressive disorder, single episode, unspecified: Secondary | ICD-10-CM | POA: Diagnosis not present

## 2017-10-14 DIAGNOSIS — Z89612 Acquired absence of left leg above knee: Secondary | ICD-10-CM | POA: Diagnosis not present

## 2017-10-14 DIAGNOSIS — Z89611 Acquired absence of right leg above knee: Secondary | ICD-10-CM | POA: Diagnosis not present

## 2017-10-27 ENCOUNTER — Telehealth: Payer: Self-pay

## 2017-10-27 MED ORDER — ROSUVASTATIN CALCIUM 20 MG PO TABS: 20.0000 mg | ORAL_TABLET | Freq: Every day | ORAL | 0 refills | Status: DC

## 2017-10-27 NOTE — Telephone Encounter (Signed)
rx sent to pharmacy as requested

## 2017-11-14 DIAGNOSIS — Z89612 Acquired absence of left leg above knee: Secondary | ICD-10-CM | POA: Diagnosis not present

## 2017-11-14 DIAGNOSIS — Z89611 Acquired absence of right leg above knee: Secondary | ICD-10-CM | POA: Diagnosis not present

## 2017-11-23 DIAGNOSIS — I10 Essential (primary) hypertension: Secondary | ICD-10-CM | POA: Diagnosis not present

## 2017-11-23 DIAGNOSIS — I2510 Atherosclerotic heart disease of native coronary artery without angina pectoris: Secondary | ICD-10-CM | POA: Diagnosis not present

## 2017-12-11 DIAGNOSIS — Z23 Encounter for immunization: Secondary | ICD-10-CM | POA: Diagnosis not present

## 2017-12-14 DIAGNOSIS — Z89611 Acquired absence of right leg above knee: Secondary | ICD-10-CM | POA: Diagnosis not present

## 2017-12-14 DIAGNOSIS — Z89612 Acquired absence of left leg above knee: Secondary | ICD-10-CM | POA: Diagnosis not present

## 2017-12-28 ENCOUNTER — Ambulatory Visit: Payer: PPO | Admitting: Cardiology

## 2017-12-31 DIAGNOSIS — E785 Hyperlipidemia, unspecified: Secondary | ICD-10-CM | POA: Diagnosis not present

## 2017-12-31 DIAGNOSIS — Z79899 Other long term (current) drug therapy: Secondary | ICD-10-CM | POA: Diagnosis not present

## 2018-01-04 ENCOUNTER — Ambulatory Visit (INDEPENDENT_AMBULATORY_CARE_PROVIDER_SITE_OTHER): Payer: PPO | Admitting: Cardiology

## 2018-01-04 ENCOUNTER — Encounter: Payer: Self-pay | Admitting: Cardiology

## 2018-01-04 VITALS — BP 108/48 | HR 80

## 2018-01-04 DIAGNOSIS — I480 Paroxysmal atrial fibrillation: Secondary | ICD-10-CM

## 2018-01-04 DIAGNOSIS — I5042 Chronic combined systolic (congestive) and diastolic (congestive) heart failure: Secondary | ICD-10-CM | POA: Diagnosis not present

## 2018-01-04 DIAGNOSIS — Z7901 Long term (current) use of anticoagulants: Secondary | ICD-10-CM

## 2018-01-04 DIAGNOSIS — I2510 Atherosclerotic heart disease of native coronary artery without angina pectoris: Secondary | ICD-10-CM | POA: Diagnosis not present

## 2018-01-04 DIAGNOSIS — I110 Hypertensive heart disease with heart failure: Secondary | ICD-10-CM

## 2018-01-04 MED ORDER — FUROSEMIDE 20 MG PO TABS: 20.0000 mg | ORAL_TABLET | Freq: Every day | ORAL | 1 refills | Status: DC

## 2018-01-04 MED ORDER — FUROSEMIDE 20 MG PO TABS: 20.0000 mg | ORAL_TABLET | Freq: Every day | ORAL | 2 refills | Status: DC

## 2018-01-04 NOTE — Patient Instructions (Signed)
Medication Instructions:  Your physician has recommended you make the following change in your medication:  RESTART furosemide (lasix) 20 mg: Take 1 tablet daily   If you need a refill on your cardiac medications before your next appointment, please call your pharmacy.   Lab work: None  If you have labs (blood work) drawn today and your tests are completely normal, you will receive your results only by: Marland Kitchen MyChart Message (if you have MyChart) OR . A paper copy in the mail If you have any lab test that is abnormal or we need to change your treatment, we will call you to review the results.  Testing/Procedures: None  Follow-Up: At [facility], you and your health needs are our priority.  As part of our continuing mission to provide you with exceptional heart care, we have created designated Provider Care Teams.  These Care Teams include your primary Cardiologist (physician) and Advanced Practice Providers (APPs -  Physician Assistants and Nurse Practitioners) who all work together to provide you with the care you need, when you need it. You will need a follow up appointment in 6 months.  Please call our office 2 months in advance to schedule this appointment.

## 2018-01-04 NOTE — Progress Notes (Signed)
Cardiology Office Note:    Date:  01/04/2018   ID:  [name] [name], DOB [birth year], MRN [id number]  PCP:  Marylen Ponto, MD  Cardiologist:  Norman Herrlich, MD    Referring MD: Marylen Ponto, MD    ASSESSMENT:    1. CAD in native artery   2. Chronic combined systolic and diastolic heart failure (HCC)   3. Hypertensive heart disease with chronic combined systolic and diastolic congestive heart failure (HCC)   4. Chronic anticoagulation    PLAN:    In order of problems listed above:  1. CAD is stable New York Heart Association class I continue current medical treatment including his anticoagulant and high intensity statin. 2. Heart failure stable resume diuretic furosemide 20 mg daily 3. Stable blood pressure continue current treatment including ACE inhibitor 4. Continue his anticoagulant with atrial fibrillation   Next appointment: 6 months   Medication Adjustments/Labs and Tests Ordered: Current medicines are reviewed at length with the patient today.  Concerns regarding medicines are outlined above.  No orders of the defined types were placed in this encounter.  No orders of the defined types were placed in this encounter.   Chief Complaint  Patient presents with  . Coronary Artery Disease  . Congestive Heart Failure  . Atrial Fibrillation  . Anticoagulation  . Hypertension  . Hyperlipidemia    History of Present Illness:    [name] [name] is a 77 y.o. male with a hx of PAD Type 2 NSTEMI, CAD EF 35-40%,CVA, PAF anticoagulated , HIT, hyperlipidemia,  LEFT AKA 05/02/16  last seen 04/27/17. Compliance with diet, lifestyle and medications: Yes  Unfortunately has had further loss in his family with the granddaughter dying this summer despite this he has moved on in life remains active [facility] and socialization.  Has had no angina dyspnea palpitation or syncope he is improved with a spinal cord stimulator removed still has  neuropathic pain and takes gabapentin. Past Medical History:  Diagnosis Date  . Acute systolic congestive heart failure (HCC) 02/01/2016  . Anemia   . Anxiety   . Arthritis   . Atrial fibrillation (HCC)   . Bell's palsy   . Blood transfusion without reported diagnosis   . CAD in native artery 09/19/2014   Overview:  1.s/p nonQwave MI 2001, PTCA and stent of LAD and subsequently repeat PTCA and stent of 2 lesions in LAD 08-19-00   2. MPS in March 2011 wo ischemia, EF 44%   3. Lexiscan MPS 07/07/11 wo ischemia, normal EF%  . Cardiomyopathy (HCC) 03/26/2016  . Carotid artery disease (HCC) 04/29/2016   Overview:  S/P right CEA  . Carotid artery occlusion   . CHF (congestive heart failure) (HCC)   . COPD (chronic obstructive pulmonary disease) (HCC)   . Coronary artery disease   . Demand ischemia of myocardium (HCC) 01/15/2015  . Depression   . Essential hypertension 09/19/2014  . High cholesterol 04/24/2017  . History of atrial fibrillation 04/29/2016  . HIT (heparin-induced thrombocytopenia) (HCC) 04/29/2016  . Hyperlipidemia   . Hypertension   . Impaired mobility and activities of daily living 05/12/2016  . Intermittent claudication (HCC) 02/24/2012  . Ischemic cardiomyopathy 02/01/2016   Overview:  Added automatically from request for surgery [id number]  . Lumbar spondylosis 04/24/2017  . Myocardial infarction (HCC)    X's 2  . Neuropathy 04/24/2017  . Pain in limb 02/24/2012  . PAOD (peripheral arterial occlusive disease) (  HCC) 01/22/2016  . Peripheral vascular disease (HCC)   . Peripheral vascular disease, unspecified (HCC) 02/24/2012  . S/P AKA (above knee amputation) unilateral, left (HCC) 05/12/2016  . Seizures (HCC)   . Sleep apnea   . Stroke [facility])     Past Surgical History:  Procedure Laterality Date  .  stimulator  Aug. 6, 2012   Implantation of Spinal Stimulator  . ANGIOPLASTY  Dec. 2001   with stent  . APPENDECTOMY    . CAROTID ENDARTERECTOMY  Aug. 2002   RIGHT  cea  .  Catherization  June 2002   Cardiac  . CHOLECYSTECTOMY     Gall Bladder- llaproscopic  . COLON SURGERY  Jan. 2009   Ischemic  . EYE SURGERY  1949  . FEMORAL BYPASS    . FINGER AMPUTATION  1960   Right  thumb  . FRACTURE SURGERY    . LEG AMPUTATION ABOVE KNEE Right   . ROTATOR CUFF REPAIR  2007   Right  shoulder  . SPINAL FUSION  Sept. 30, 2012  . SPINE SURGERY  Feb. 2001  . THROMBOENDARTERECTOMY      Current Medications: Current Meds  Medication Sig  . acetaminophen (TYLENOL) 325 MG tablet Take 650 mg by mouth every 4 (four) hours as needed.  . cholecalciferol (VITAMIN D) 1000 UNITS tablet Take 1,000 Units by mouth daily. Reported on 02/21/2015  . Cyanocobalamin (VITAMIN B-12) 5000 MCG TBDP Take 1 tablet by mouth daily.  Marland Kitchen ELIQUIS 5 MG TABS tablet Take 5 mg by mouth 2 (two) times daily.  . ferrous sulfate 325 (65 FE) MG tablet Take 325 mg by mouth daily.   . folic acid (FOLVITE) 1 MG tablet Take 1 mg by mouth daily.  Marland Kitchen gabapentin (NEURONTIN) 300 MG capsule Take 1,200 mg by mouth 3 (three) times daily.   Marland Kitchen lidocaine (XYLOCAINE) 5 % ointment Apply 1 application topically as needed.  Marland Kitchen lisinopril (PRINIVIL,ZESTRIL) 5 MG tablet Take 5 mg by mouth daily.  . Multiple Vitamin (MULTIVITAMIN) tablet Take 1 tablet by mouth daily.  . Omega-3 Fatty Acids (FISH OIL) 1200 MG CPDR Take 2 capsules by mouth daily.   . ondansetron (ZOFRAN) 4 MG tablet Take 4 mg by mouth every 8 (eight) hours as needed for nausea/vomiting.  . rosuvastatin (CRESTOR) 20 MG tablet Take 1 tablet (20 mg total) by mouth daily.     Allergies:   Codeine; Heparin; Percocet [oxycodone-acetaminophen]; Promethazine hcl; Promethazine hcl; Buprenorphine hcl; Morphine and related; Amitriptyline; and Lyrica [pregabalin]   Social History   Socioeconomic History  . Marital status: Married    Spouse name: Not on file  . Number of children: Not on file  . Years of education: Not on file  . Highest education level: Not on file    Occupational History  . Not on file  Social Needs  . Financial resource strain: Not on file  . Food insecurity:    Worry: Not on file    Inability: Not on file  . Transportation needs:    Medical: Not on file    Non-medical: Not on file  Tobacco Use  . Smoking status: Former Smoker    Types: Cigarettes    Start date: 02/24/2001  . Smokeless tobacco: Never Used  Substance and Sexual Activity  . Alcohol use: No  . Drug use: No  . Sexual activity: Not on file  Lifestyle  . Physical activity:    Days per week: Not on file    Minutes per  session: Not on file  . Stress: Not on file  Relationships  . Social connections:    Talks on phone: Not on file    Gets together: Not on file    Attends religious service: Not on file    Active member of club or organization: Not on file    Attends meetings of clubs or organizations: Not on file    Relationship status: Not on file  Other Topics Concern  . Not on file  Social History Narrative  . Not on file     Family History: The patient's family history includes Cancer in his daughter and sister; Deep vein thrombosis in his son; Heart attack in his father and son; Heart disease in his father and son; Hyperlipidemia in his brother, father, and son; Hypertension in his father, mother, sister, and son. ROS:   Please see the history of present illness.    All other systems reviewed and are negative.  EKGs/Labs/Other Studies Reviewed:    The following studies were reviewed today:   Recent Labs: 12/31/2017 cholesterol 125 LDL 15 creatinine normal. No results found for requested labs within last 8760 hours.  Recent Lipid Panel No results found for: CHOL, TRIG, HDL, CHOLHDL, VLDL, LDLCALC, LDLDIRECT  Physical Exam:    VS:  BP (!) 108/48 (BP Location: Left Arm, Patient Position: Sitting, Cuff Size: Normal)   Pulse 80   SpO2 93%     Wt Readings from Last 3 Encounters:  05/27/16 143 lb (64.9 kg)  02/26/16 164 lb (74.4 kg)  02/21/15  164 lb (74.4 kg)     GEN:  Well nourished, well developed in no acute distress HEENT: Normal NECK: No JVD; No carotid bruits LYMPHATICS: No lymphadenopathy CARDIAC: RRR, no murmurs, rubs, gallops RESPIRATORY:  Clear to auscultation without rales, wheezing or rhonchi  ABDOMEN: Soft, non-tender, non-distended MUSCULOSKELETAL:  No edema; No deformity  SKIN: Warm and dry NEUROLOGIC:  Alert and oriented x 3 PSYCHIATRIC:  Normal affect    Signed, Norman Herrlich, MD  01/04/2018 2:42 PM    [facility]

## 2018-01-14 DIAGNOSIS — Z89611 Acquired absence of right leg above knee: Secondary | ICD-10-CM | POA: Diagnosis not present

## 2018-01-14 DIAGNOSIS — Z89612 Acquired absence of left leg above knee: Secondary | ICD-10-CM | POA: Diagnosis not present

## 2018-01-23 DIAGNOSIS — I10 Essential (primary) hypertension: Secondary | ICD-10-CM | POA: Diagnosis not present

## 2018-01-23 DIAGNOSIS — I2510 Atherosclerotic heart disease of native coronary artery without angina pectoris: Secondary | ICD-10-CM | POA: Diagnosis not present

## 2018-01-23 DIAGNOSIS — E329 Disease of thymus, unspecified: Secondary | ICD-10-CM | POA: Diagnosis not present

## 2018-01-27 DIAGNOSIS — G546 Phantom limb syndrome with pain: Secondary | ICD-10-CM | POA: Diagnosis not present

## 2018-01-27 DIAGNOSIS — Z89612 Acquired absence of left leg above knee: Secondary | ICD-10-CM | POA: Diagnosis not present

## 2018-01-27 DIAGNOSIS — Z89611 Acquired absence of right leg above knee: Secondary | ICD-10-CM | POA: Diagnosis not present

## 2018-02-13 DIAGNOSIS — Z89612 Acquired absence of left leg above knee: Secondary | ICD-10-CM | POA: Diagnosis not present

## 2018-02-13 DIAGNOSIS — Z89611 Acquired absence of right leg above knee: Secondary | ICD-10-CM | POA: Diagnosis not present

## 2018-02-23 DIAGNOSIS — G546 Phantom limb syndrome with pain: Secondary | ICD-10-CM | POA: Diagnosis not present

## 2018-02-23 DIAGNOSIS — I2510 Atherosclerotic heart disease of native coronary artery without angina pectoris: Secondary | ICD-10-CM | POA: Diagnosis not present

## 2018-02-23 DIAGNOSIS — E785 Hyperlipidemia, unspecified: Secondary | ICD-10-CM | POA: Diagnosis not present

## 2018-03-16 DIAGNOSIS — Z89611 Acquired absence of right leg above knee: Secondary | ICD-10-CM | POA: Diagnosis not present

## 2018-03-16 DIAGNOSIS — Z89612 Acquired absence of left leg above knee: Secondary | ICD-10-CM | POA: Diagnosis not present

## 2018-03-25 DIAGNOSIS — E785 Hyperlipidemia, unspecified: Secondary | ICD-10-CM | POA: Diagnosis not present

## 2018-03-25 DIAGNOSIS — I10 Essential (primary) hypertension: Secondary | ICD-10-CM | POA: Diagnosis not present

## 2018-03-25 DIAGNOSIS — F329 Major depressive disorder, single episode, unspecified: Secondary | ICD-10-CM | POA: Diagnosis not present

## 2018-03-31 DIAGNOSIS — Z09 Encounter for follow-up examination after completed treatment for conditions other than malignant neoplasm: Secondary | ICD-10-CM | POA: Diagnosis not present

## 2018-03-31 DIAGNOSIS — Z89611 Acquired absence of right leg above knee: Secondary | ICD-10-CM | POA: Diagnosis not present

## 2018-03-31 DIAGNOSIS — Z89612 Acquired absence of left leg above knee: Secondary | ICD-10-CM | POA: Diagnosis not present

## 2018-03-31 DIAGNOSIS — Z79899 Other long term (current) drug therapy: Secondary | ICD-10-CM | POA: Diagnosis not present

## 2018-04-12 DIAGNOSIS — E559 Vitamin D deficiency, unspecified: Secondary | ICD-10-CM | POA: Diagnosis not present

## 2018-04-12 DIAGNOSIS — Z79899 Other long term (current) drug therapy: Secondary | ICD-10-CM | POA: Diagnosis not present

## 2018-04-12 DIAGNOSIS — E785 Hyperlipidemia, unspecified: Secondary | ICD-10-CM | POA: Diagnosis not present

## 2018-04-16 DIAGNOSIS — Z89612 Acquired absence of left leg above knee: Secondary | ICD-10-CM | POA: Diagnosis not present

## 2018-04-16 DIAGNOSIS — Z89611 Acquired absence of right leg above knee: Secondary | ICD-10-CM | POA: Diagnosis not present

## 2018-06-24 DIAGNOSIS — I10 Essential (primary) hypertension: Secondary | ICD-10-CM | POA: Diagnosis not present

## 2018-06-24 DIAGNOSIS — F329 Major depressive disorder, single episode, unspecified: Secondary | ICD-10-CM | POA: Diagnosis not present

## 2018-07-13 DIAGNOSIS — Z89611 Acquired absence of right leg above knee: Secondary | ICD-10-CM | POA: Diagnosis not present

## 2018-07-13 DIAGNOSIS — Z89612 Acquired absence of left leg above knee: Secondary | ICD-10-CM | POA: Diagnosis not present

## 2018-07-24 DIAGNOSIS — I10 Essential (primary) hypertension: Secondary | ICD-10-CM | POA: Diagnosis not present

## 2018-07-24 DIAGNOSIS — M4800 Spinal stenosis, site unspecified: Secondary | ICD-10-CM | POA: Diagnosis not present

## 2018-08-17 DIAGNOSIS — Z89611 Acquired absence of right leg above knee: Secondary | ICD-10-CM | POA: Diagnosis not present

## 2018-08-17 DIAGNOSIS — Z89612 Acquired absence of left leg above knee: Secondary | ICD-10-CM | POA: Diagnosis not present

## 2018-08-24 DIAGNOSIS — F329 Major depressive disorder, single episode, unspecified: Secondary | ICD-10-CM | POA: Diagnosis not present

## 2018-08-24 DIAGNOSIS — I10 Essential (primary) hypertension: Secondary | ICD-10-CM | POA: Diagnosis not present

## 2018-08-24 DIAGNOSIS — E785 Hyperlipidemia, unspecified: Secondary | ICD-10-CM | POA: Diagnosis not present

## 2018-09-24 ENCOUNTER — Other Ambulatory Visit: Payer: Self-pay

## 2018-09-24 DIAGNOSIS — I10 Essential (primary) hypertension: Secondary | ICD-10-CM | POA: Diagnosis not present

## 2018-09-24 DIAGNOSIS — F329 Major depressive disorder, single episode, unspecified: Secondary | ICD-10-CM | POA: Diagnosis not present

## 2018-10-05 ENCOUNTER — Other Ambulatory Visit: Payer: Self-pay | Admitting: Cardiology

## 2018-10-25 DIAGNOSIS — E785 Hyperlipidemia, unspecified: Secondary | ICD-10-CM | POA: Diagnosis not present

## 2018-10-25 DIAGNOSIS — F329 Major depressive disorder, single episode, unspecified: Secondary | ICD-10-CM | POA: Diagnosis not present

## 2018-10-25 DIAGNOSIS — I10 Essential (primary) hypertension: Secondary | ICD-10-CM | POA: Diagnosis not present

## 2018-11-04 DIAGNOSIS — Z23 Encounter for immunization: Secondary | ICD-10-CM | POA: Diagnosis not present

## 2018-11-19 DIAGNOSIS — H25813 Combined forms of age-related cataract, bilateral: Secondary | ICD-10-CM | POA: Diagnosis not present

## 2018-11-24 DIAGNOSIS — F329 Major depressive disorder, single episode, unspecified: Secondary | ICD-10-CM | POA: Diagnosis not present

## 2018-11-24 DIAGNOSIS — I10 Essential (primary) hypertension: Secondary | ICD-10-CM | POA: Diagnosis not present

## 2018-11-26 ENCOUNTER — Other Ambulatory Visit: Payer: Self-pay | Admitting: Cardiology

## 2018-11-26 MED ORDER — FUROSEMIDE 20 MG PO TABS: 20.0000 mg | ORAL_TABLET | Freq: Every day | ORAL | 0 refills | Status: DC

## 2018-11-26 NOTE — Telephone Encounter (Signed)
Rx for furosemide sent to [facility] as requested.

## 2018-11-26 NOTE — Telephone Encounter (Signed)
°*  STAT* If patient is at the pharmacy, call can be transferred to refill team.   1. Which medications need to be refilled? (please list name of each medication and dose if known) Furosemide 1 daily   2. Which pharmacy/location (including street and city if local pharmacy) is medication to be sent to? [facility]   3. Do they need a 30 day or 90 day supply?   Patient is scheduled for appt 12/01/2018

## 2018-11-29 DIAGNOSIS — E785 Hyperlipidemia, unspecified: Secondary | ICD-10-CM | POA: Diagnosis not present

## 2018-11-29 NOTE — Progress Notes (Signed)
Cardiology Office Note:    Date:  12/01/2018   ID:  [name] [name], DOB May 28, 1940, MRN [id number]  PCP:  Marylen Ponto, MD  Cardiologist:  Norman Herrlich, MD    Referring MD: Marylen Ponto, MD    ASSESSMENT:    1. CAD in native artery   2. Chronic combined systolic and diastolic heart failure (HCC)   3. Hypertensive heart disease with chronic combined systolic and diastolic congestive heart failure (HCC)   4. PAF (paroxysmal atrial fibrillation) (HCC)   5. Chronic anticoagulation   6. Hyperlipidemia, unspecified hyperlipidemia type    PLAN:    In order of problems listed above:  1. CAD stable New York Heart Association class I having no anginal discomfort on current medical treatment including anticoagulant high intensity statin and EPA.  Continue current treatment at this time I would not advise an ischemia evaluation 2. Heart failure stable compensated New York Heart Association class I continue his current diuretic he has no edema and recheck echocardiogram if EF is reduced further we will transition ACE inhibitor to Entresto. 3. Atrial fibrillation paroxysmal stable in sinus rhythm although he is frequent PVCs 4. Continue his anticoagulation moderate stroke risk 5. Continue current lipid-lowering therapy await labs from his PCP   Next appointment: 6 months   Medication Adjustments/Labs and Tests Ordered: Current medicines are reviewed at length with the patient today.  Concerns regarding medicines are outlined above.  No orders of the defined types were placed in this encounter.  No orders of the defined types were placed in this encounter.   Chief Complaint  Patient presents with  . Follow-up  . Coronary Artery Disease  . Congestive Heart Failure  . Atrial Fibrillation  . Anticoagulation    History of Present Illness:    [name] [name] is a 78 y.o. male with a hx of PAD Type 2 NSTEMI, CAD EF 35-40% in December 2017,CVA, PAF anticoagulated , HIT,  hyperlipidemia,  LEFT AKA 05/02/16  last seen 01/04/2018. Compliance with diet, lifestyle and medications: Yes  He continues to do well he is active in habitat with home building.  Despite his limitations he has no shortness of breath edema chest pain palpitation or syncope.  Is been over 2 years and with his reduced ejection fraction CAD will recheck echocardiogram and if is less than 35% with prompt changes in his treatment or even consideration of ICD.  He had labs drawn in the last week at [facility] family practice unavailable through K p.m. and will request a copy.  His last lipid profile 04/12/2018 and a cholesterol 126 LDL 39 and at that time he had normal renal function. Past Medical History:  Diagnosis Date  . Acute systolic congestive heart failure (HCC) 02/01/2016  . Anemia   . Anxiety   . Arthritis   . Atrial fibrillation (HCC)   . Bell's palsy   . Blood transfusion without reported diagnosis   . CAD in native artery 09/19/2014   Overview:  1.s/p nonQwave MI 2001, PTCA and stent of LAD and subsequently repeat PTCA and stent of 2 lesions in LAD 08-19-00   2. MPS in March 2011 wo ischemia, EF 44%   3. Lexiscan MPS 07/07/11 wo ischemia, normal EF%  . Cardiomyopathy (HCC) 03/26/2016  . Carotid artery disease (HCC) 04/29/2016   Overview:  S/P right CEA  . Carotid artery occlusion   . CHF (congestive heart failure) (HCC)   . COPD (chronic obstructive pulmonary disease) (HCC)   .  Coronary artery disease   . Demand ischemia of myocardium ([place]) 01/15/2015  . Depression   . Essential hypertension 09/19/2014  . High cholesterol 04/24/2017  . History of atrial fibrillation 04/29/2016  . HIT (heparin-induced thrombocytopenia) ([place]) 04/29/2016  . Hyperlipidemia   . Hypertension   . Impaired mobility and activities of daily living 05/12/2016  . Intermittent claudication ([place]) 02/24/2012  . Ischemic cardiomyopathy 02/01/2016   Overview:  Added automatically from request for surgery [id number]  . Lumbar  spondylosis 04/24/2017  . Myocardial infarction ([place])    X's 2  . Neuropathy 04/24/2017  . Pain in limb 02/24/2012  . PAOD (peripheral arterial occlusive disease) ([place]) 01/22/2016  . Peripheral vascular disease ([place])   . Peripheral vascular disease, unspecified ([place]) 02/24/2012  . S/P AKA (above knee amputation) unilateral, left ([place]) 05/12/2016  . Seizures ([place])   . Sleep apnea   . Stroke [facility])     Past Surgical History:  Procedure Laterality Date  .  stimulator  Aug. 6, 2012   Implantation of Spinal Stimulator  . ANGIOPLASTY  Dec. 2001   with stent  . APPENDECTOMY    . CAROTID ENDARTERECTOMY  Aug. 2002   RIGHT  cea  . Catherization  June 2002   Cardiac  . CHOLECYSTECTOMY     Gall Bladder- llaproscopic  . COLON SURGERY  Jan. 2009   Ischemic  . EYE SURGERY  1949  . FEMORAL BYPASS    . FINGER AMPUTATION  1960   Right  thumb  . FRACTURE SURGERY    . LEG AMPUTATION ABOVE KNEE Right   . ROTATOR CUFF REPAIR  2007   Right  shoulder  . SPINAL FUSION  Sept. 30, 2012  . SPINE SURGERY  Feb. 2001  . THROMBOENDARTERECTOMY      Current Medications: Current Meds  Medication Sig  . carbamazepine (TEGRETOL XR) 100 MG 12 hr tablet Take 100 mg by mouth 2 (two) times daily.  . cholecalciferol (VITAMIN D) 1000 UNITS tablet Take 1,000 Units by mouth daily. Reported on 02/21/2015  . Cyanocobalamin (VITAMIN B-12) 5000 MCG TBDP Take 1 tablet by mouth daily.  Marland Kitchen ELIQUIS 5 MG TABS tablet Take 5 mg by mouth 2 (two) times daily.  . fenofibrate 160 MG tablet Take 160 mg by mouth daily.  . folic acid (FOLVITE) 1 MG tablet Take 1 mg by mouth daily.  . furosemide (LASIX) 20 MG tablet Take 1 tablet (20 mg total) by mouth daily.  Marland Kitchen gabapentin (NEURONTIN) 300 MG capsule Take 1,200 mg by mouth 3 (three) times daily.   Marland Kitchen lisinopril (PRINIVIL,ZESTRIL) 5 MG tablet Take 5 mg by mouth daily.  . Multiple Vitamin (MULTIVITAMIN) tablet Take 1 tablet by mouth daily.  . rosuvastatin (CRESTOR) 40 MG tablet Take 40  mg by mouth daily.  Marland Kitchen VASCEPA 1 g CAPS TAKE 2 (TWO) CAPSULE TWICE DAILYFOR HIGH TRIGLYCERIDE LEVEL     Allergies:   Codeine, Heparin, Percocet [oxycodone-acetaminophen], Promethazine hcl, Promethazine hcl, Buprenorphine hcl, Morphine and related, Amitriptyline, and Lyrica [pregabalin]   Social History   Socioeconomic History  . Marital status: Married    Spouse name: Not on file  . Number of children: Not on file  . Years of education: Not on file  . Highest education level: Not on file  Occupational History  . Not on file  Social Needs  . Financial resource strain: Not on file  . Food insecurity    Worry: Not on file    Inability: Not on file  .  Transportation needs    Medical: Not on file    Non-medical: Not on file  Tobacco Use  . Smoking status: Former Smoker    Types: Cigarettes    Start date: 02/24/2001  . Smokeless tobacco: Never Used  Substance and Sexual Activity  . Alcohol use: No  . Drug use: No  . Sexual activity: Not on file  Lifestyle  . Physical activity    Days per week: Not on file    Minutes per session: Not on file  . Stress: Not on file  Relationships  . Social Musician on phone: Not on file    Gets together: Not on file    Attends religious service: Not on file    Active member of club or organization: Not on file    Attends meetings of clubs or organizations: Not on file    Relationship status: Not on file  Other Topics Concern  . Not on file  Social History Narrative  . Not on file     Family History: The patient's family history includes Cancer in his daughter and sister; Deep vein thrombosis in his son; Heart attack in his father and son; Heart disease in his father and son; Hyperlipidemia in his brother, father, and son; Hypertension in his father, mother, sister, and son. ROS:   Please see the history of present illness.    All other systems reviewed and are negative.  EKGs/Labs/Other Studies Reviewed:    The following  studies were reviewed today:  EKG:  EKG ordered today and personally reviewed.  The ekg ordered today demonstrates sinus rhythm frequent PVCs pattern most consistent with LVH and repolarization.    Physical Exam:    VS:  BP (!) 106/50 (BP Location: Right Arm, Patient Position: Sitting, Cuff Size: Normal)   Pulse 87   SpO2 94%     Wt Readings from Last 3 Encounters:  05/27/16 143 lb (64.9 kg)  02/26/16 164 lb (74.4 kg)  02/21/15 164 lb (74.4 kg)     GEN: He appears frail well nourished, well developed in no acute distress HEENT: Normal NECK: No JVD; No carotid bruits LYMPHATICS: No lymphadenopathy CARDIAC: RRR, no murmurs, rubs, gallops RESPIRATORY:  Clear to auscultation without rales, wheezing or rhonchi  ABDOMEN: Soft, non-tender, non-distended MUSCULOSKELETAL:  No edema; No deformity  SKIN: Warm and dry NEUROLOGIC:  Alert and oriented x 3 PSYCHIATRIC:  Normal affect    Signed, Norman Herrlich, MD  12/01/2018 3:14 PM    [facility]

## 2018-12-01 ENCOUNTER — Other Ambulatory Visit: Payer: Self-pay

## 2018-12-01 ENCOUNTER — Ambulatory Visit (INDEPENDENT_AMBULATORY_CARE_PROVIDER_SITE_OTHER): Payer: PPO | Admitting: Cardiology

## 2018-12-01 ENCOUNTER — Encounter: Payer: Self-pay | Admitting: *Deleted

## 2018-12-01 ENCOUNTER — Encounter: Payer: Self-pay | Admitting: Cardiology

## 2018-12-01 VITALS — BP 106/50 | HR 87

## 2018-12-01 DIAGNOSIS — Z7901 Long term (current) use of anticoagulants: Secondary | ICD-10-CM

## 2018-12-01 DIAGNOSIS — I5042 Chronic combined systolic (congestive) and diastolic (congestive) heart failure: Secondary | ICD-10-CM

## 2018-12-01 DIAGNOSIS — I480 Paroxysmal atrial fibrillation: Secondary | ICD-10-CM

## 2018-12-01 DIAGNOSIS — E785 Hyperlipidemia, unspecified: Secondary | ICD-10-CM

## 2018-12-01 DIAGNOSIS — I2510 Atherosclerotic heart disease of native coronary artery without angina pectoris: Secondary | ICD-10-CM | POA: Diagnosis not present

## 2018-12-01 DIAGNOSIS — I110 Hypertensive heart disease with heart failure: Secondary | ICD-10-CM | POA: Diagnosis not present

## 2018-12-01 NOTE — Patient Instructions (Signed)
Medication Instructions:  Your physician recommends that you continue on your current medications as directed. Please refer to the Current Medication list given to you today.  If you need a refill on your cardiac medications before your next appointment, please call your pharmacy.   Lab work: None  If you have labs (blood work) drawn today and your tests are completely normal, you will receive your results only by: . MyChart Message (if you have MyChart) OR . A paper copy in the mail If you have any lab test that is abnormal or we need to change your treatment, we will call you to review the results.  Testing/Procedures: You had an EKG today.   Your physician has requested that you have an echocardiogram. Echocardiography is a painless test that uses sound waves to create images of your heart. It provides your doctor with information about the size and shape of your heart and how well your heart's chambers and valves are working. This procedure takes approximately one hour. There are no restrictions for this procedure.  Follow-Up: At [facility], you and your health needs are our priority.  As part of our continuing mission to provide you with exceptional heart care, we have created designated Provider Care Teams.  These Care Teams include your primary Cardiologist (physician) and Advanced Practice Providers (APPs -  Physician Assistants and Nurse Practitioners) who all work together to provide you with the care you need, when you need it. You will need a follow up appointment in 6 months.  Please call our office 2 months in advance to schedule this appointment.      Echocardiogram An echocardiogram is a procedure that uses painless sound waves (ultrasound) to produce an image of the heart. Images from an echocardiogram can provide important information about:  Signs of coronary artery disease (CAD).  Aneurysm detection. An aneurysm is a weak or damaged part of an artery wall that  bulges out from the normal force of blood pumping through the body.  Heart size and shape. Changes in the size or shape of the heart can be associated with certain conditions, including heart failure, aneurysm, and CAD.  Heart muscle function.  Heart valve function.  Signs of a past heart attack.  Fluid buildup around the heart.  Thickening of the heart muscle.  A tumor or infectious growth around the heart valves. Tell a health care provider about:  Any allergies you have.  All medicines you are taking, including vitamins, herbs, eye drops, creams, and over-the-counter medicines.  Any blood disorders you have.  Any surgeries you have had.  Any medical conditions you have.  Whether you are pregnant or may be pregnant. What are the risks? Generally, this is a safe procedure. However, problems may occur, including:  Allergic reaction to dye (contrast) that may be used during the procedure. What happens before the procedure? No specific preparation is needed. You may eat and drink normally. What happens during the procedure?   An IV tube may be inserted into one of your veins.  You may receive contrast through this tube. A contrast is an injection that improves the quality of the pictures from your heart.  A gel will be applied to your chest.  A wand-like tool (transducer) will be moved over your chest. The gel will help to transmit the sound waves from the transducer.  The sound waves will harmlessly bounce off of your heart to allow the heart images to be captured in real-time motion. The images   will be recorded on a computer. The procedure may vary among health care providers and hospitals. What happens after the procedure?  You may return to your normal, everyday life, including diet, activities, and medicines, unless your health care provider tells you not to do that. Summary  An echocardiogram is a procedure that uses painless sound waves (ultrasound) to produce  an image of the heart.  Images from an echocardiogram can provide important information about the size and shape of your heart, heart muscle function, heart valve function, and fluid buildup around your heart.  You do not need to do anything to prepare before this procedure. You may eat and drink normally.  After the echocardiogram is completed, you may return to your normal, everyday life, unless your health care provider tells you not to do that. This information is not intended to replace advice given to you by your health care provider. Make sure you discuss any questions you have with your health care provider. Document Released: 02/08/2000 Document Revised: 06/03/2018 Document Reviewed: 03/15/2016 Elsevier Patient Education  2020 Elsevier Inc.    

## 2018-12-24 DIAGNOSIS — I10 Essential (primary) hypertension: Secondary | ICD-10-CM | POA: Diagnosis not present

## 2018-12-24 DIAGNOSIS — M4800 Spinal stenosis, site unspecified: Secondary | ICD-10-CM | POA: Diagnosis not present

## 2019-01-05 ENCOUNTER — Other Ambulatory Visit: Payer: Self-pay

## 2019-01-05 ENCOUNTER — Ambulatory Visit (INDEPENDENT_AMBULATORY_CARE_PROVIDER_SITE_OTHER): Payer: PPO

## 2019-01-05 DIAGNOSIS — I5042 Chronic combined systolic (congestive) and diastolic (congestive) heart failure: Secondary | ICD-10-CM

## 2019-01-05 DIAGNOSIS — I110 Hypertensive heart disease with heart failure: Secondary | ICD-10-CM

## 2019-01-05 DIAGNOSIS — I480 Paroxysmal atrial fibrillation: Secondary | ICD-10-CM | POA: Diagnosis not present

## 2019-01-05 NOTE — Progress Notes (Deleted)
Complete echocardiogram has been performed.  Jimmy Erandy Mceachern RDCS, RVT 

## 2019-01-05 NOTE — Progress Notes (Signed)
Complete echocardiogram has been performed.  Jimmy Lateasha Breuer RDCS, RVT 

## 2019-01-19 DIAGNOSIS — Z89611 Acquired absence of right leg above knee: Secondary | ICD-10-CM | POA: Diagnosis not present

## 2019-01-19 DIAGNOSIS — Z89612 Acquired absence of left leg above knee: Secondary | ICD-10-CM | POA: Diagnosis not present

## 2019-01-24 DIAGNOSIS — E785 Hyperlipidemia, unspecified: Secondary | ICD-10-CM | POA: Diagnosis not present

## 2019-01-24 DIAGNOSIS — F329 Major depressive disorder, single episode, unspecified: Secondary | ICD-10-CM | POA: Diagnosis not present

## 2019-01-24 DIAGNOSIS — I2510 Atherosclerotic heart disease of native coronary artery without angina pectoris: Secondary | ICD-10-CM | POA: Diagnosis not present

## 2019-01-25 DIAGNOSIS — H25013 Cortical age-related cataract, bilateral: Secondary | ICD-10-CM | POA: Diagnosis not present

## 2019-01-25 DIAGNOSIS — H25812 Combined forms of age-related cataract, left eye: Secondary | ICD-10-CM | POA: Diagnosis not present

## 2019-01-25 DIAGNOSIS — Z01818 Encounter for other preprocedural examination: Secondary | ICD-10-CM | POA: Diagnosis not present

## 2019-02-01 DIAGNOSIS — Z89612 Acquired absence of left leg above knee: Secondary | ICD-10-CM | POA: Diagnosis not present

## 2019-02-01 DIAGNOSIS — I2510 Atherosclerotic heart disease of native coronary artery without angina pectoris: Secondary | ICD-10-CM | POA: Diagnosis not present

## 2019-02-01 DIAGNOSIS — Z87891 Personal history of nicotine dependence: Secondary | ICD-10-CM | POA: Diagnosis not present

## 2019-02-01 DIAGNOSIS — K219 Gastro-esophageal reflux disease without esophagitis: Secondary | ICD-10-CM | POA: Diagnosis not present

## 2019-02-01 DIAGNOSIS — M1990 Unspecified osteoarthritis, unspecified site: Secondary | ICD-10-CM | POA: Diagnosis not present

## 2019-02-01 DIAGNOSIS — H259 Unspecified age-related cataract: Secondary | ICD-10-CM | POA: Diagnosis not present

## 2019-02-01 DIAGNOSIS — I509 Heart failure, unspecified: Secondary | ICD-10-CM | POA: Diagnosis not present

## 2019-02-01 DIAGNOSIS — I10 Essential (primary) hypertension: Secondary | ICD-10-CM | POA: Diagnosis not present

## 2019-02-01 DIAGNOSIS — E785 Hyperlipidemia, unspecified: Secondary | ICD-10-CM | POA: Diagnosis not present

## 2019-02-01 DIAGNOSIS — F418 Other specified anxiety disorders: Secondary | ICD-10-CM | POA: Diagnosis not present

## 2019-02-01 DIAGNOSIS — G8929 Other chronic pain: Secondary | ICD-10-CM | POA: Diagnosis not present

## 2019-02-01 DIAGNOSIS — Z8673 Personal history of transient ischemic attack (TIA), and cerebral infarction without residual deficits: Secondary | ICD-10-CM | POA: Diagnosis not present

## 2019-02-01 DIAGNOSIS — H5010 Unspecified exotropia: Secondary | ICD-10-CM | POA: Diagnosis not present

## 2019-02-01 DIAGNOSIS — J449 Chronic obstructive pulmonary disease, unspecified: Secondary | ICD-10-CM | POA: Diagnosis not present

## 2019-02-01 DIAGNOSIS — Z79899 Other long term (current) drug therapy: Secondary | ICD-10-CM | POA: Diagnosis not present

## 2019-02-01 DIAGNOSIS — H25812 Combined forms of age-related cataract, left eye: Secondary | ICD-10-CM | POA: Diagnosis not present

## 2019-02-01 DIAGNOSIS — Z7901 Long term (current) use of anticoagulants: Secondary | ICD-10-CM | POA: Diagnosis not present

## 2019-02-01 DIAGNOSIS — I252 Old myocardial infarction: Secondary | ICD-10-CM | POA: Diagnosis not present

## 2019-02-01 DIAGNOSIS — Z89611 Acquired absence of right leg above knee: Secondary | ICD-10-CM | POA: Diagnosis not present

## 2019-02-01 DIAGNOSIS — I110 Hypertensive heart disease with heart failure: Secondary | ICD-10-CM | POA: Diagnosis not present

## 2019-02-22 DIAGNOSIS — Z8673 Personal history of transient ischemic attack (TIA), and cerebral infarction without residual deficits: Secondary | ICD-10-CM | POA: Diagnosis not present

## 2019-02-22 DIAGNOSIS — I509 Heart failure, unspecified: Secondary | ICD-10-CM | POA: Diagnosis not present

## 2019-02-22 DIAGNOSIS — H25811 Combined forms of age-related cataract, right eye: Secondary | ICD-10-CM | POA: Diagnosis not present

## 2019-02-22 DIAGNOSIS — I252 Old myocardial infarction: Secondary | ICD-10-CM | POA: Diagnosis not present

## 2019-02-22 DIAGNOSIS — E785 Hyperlipidemia, unspecified: Secondary | ICD-10-CM | POA: Diagnosis not present

## 2019-02-22 DIAGNOSIS — H9193 Unspecified hearing loss, bilateral: Secondary | ICD-10-CM | POA: Diagnosis not present

## 2019-02-22 DIAGNOSIS — Z955 Presence of coronary angioplasty implant and graft: Secondary | ICD-10-CM | POA: Diagnosis not present

## 2019-02-22 DIAGNOSIS — H259 Unspecified age-related cataract: Secondary | ICD-10-CM | POA: Diagnosis not present

## 2019-02-22 DIAGNOSIS — I110 Hypertensive heart disease with heart failure: Secondary | ICD-10-CM | POA: Diagnosis not present

## 2019-02-22 DIAGNOSIS — Z87891 Personal history of nicotine dependence: Secondary | ICD-10-CM | POA: Diagnosis not present

## 2019-02-22 DIAGNOSIS — M1990 Unspecified osteoarthritis, unspecified site: Secondary | ICD-10-CM | POA: Diagnosis not present

## 2019-02-24 DIAGNOSIS — F329 Major depressive disorder, single episode, unspecified: Secondary | ICD-10-CM | POA: Diagnosis not present

## 2019-02-24 DIAGNOSIS — I10 Essential (primary) hypertension: Secondary | ICD-10-CM | POA: Diagnosis not present

## 2019-03-08 DIAGNOSIS — Z89612 Acquired absence of left leg above knee: Secondary | ICD-10-CM | POA: Diagnosis not present

## 2019-03-08 DIAGNOSIS — Z89611 Acquired absence of right leg above knee: Secondary | ICD-10-CM | POA: Diagnosis not present

## 2019-03-26 DIAGNOSIS — I2510 Atherosclerotic heart disease of native coronary artery without angina pectoris: Secondary | ICD-10-CM | POA: Diagnosis not present

## 2019-03-26 DIAGNOSIS — I10 Essential (primary) hypertension: Secondary | ICD-10-CM | POA: Diagnosis not present

## 2019-03-26 DIAGNOSIS — F329 Major depressive disorder, single episode, unspecified: Secondary | ICD-10-CM | POA: Diagnosis not present

## 2019-04-05 ENCOUNTER — Telehealth: Payer: Self-pay | Admitting: Cardiology

## 2019-04-05 ENCOUNTER — Other Ambulatory Visit: Payer: Self-pay | Admitting: *Deleted

## 2019-04-05 DIAGNOSIS — Z6826 Body mass index (BMI) 26.0-26.9, adult: Secondary | ICD-10-CM | POA: Diagnosis not present

## 2019-04-05 DIAGNOSIS — Z0000 Encounter for general adult medical examination without abnormal findings: Secondary | ICD-10-CM | POA: Diagnosis not present

## 2019-04-05 DIAGNOSIS — Z79899 Other long term (current) drug therapy: Secondary | ICD-10-CM | POA: Diagnosis not present

## 2019-04-05 DIAGNOSIS — Z89612 Acquired absence of left leg above knee: Secondary | ICD-10-CM | POA: Diagnosis not present

## 2019-04-05 DIAGNOSIS — I10 Essential (primary) hypertension: Secondary | ICD-10-CM | POA: Diagnosis not present

## 2019-04-05 DIAGNOSIS — Z1331 Encounter for screening for depression: Secondary | ICD-10-CM | POA: Diagnosis not present

## 2019-04-05 DIAGNOSIS — Z89611 Acquired absence of right leg above knee: Secondary | ICD-10-CM | POA: Diagnosis not present

## 2019-04-05 MED ORDER — FUROSEMIDE 20 MG PO TABS: 20.0000 mg | ORAL_TABLET | Freq: Every day | ORAL | 1 refills | Status: DC

## 2019-04-05 NOTE — Telephone Encounter (Signed)
*  STAT* If patient is at the pharmacy, call can be transferred to refill team.   1. Which medications need to be refilled? (please list name of each medication and dose if known)  furosemide (LASIX) 20 MG tablet  2. Which pharmacy/location (including street and city if local pharmacy) is medication to be sent to?  [facility] - [place], [place] - [place] [id number]  3. Do they need a 30 day or 90 day supply? 90  The pharmacy is preparing a delivery of all of the pt's meds to go out today, but the pharmacy needs a new rx before they can fill it and send the patient his medication

## 2019-04-05 NOTE — Telephone Encounter (Signed)
[facility]ill sent to [facility] as requested.

## 2019-04-21 ENCOUNTER — Ambulatory Visit: Payer: PPO | Attending: Internal Medicine

## 2019-04-21 DIAGNOSIS — Z23 Encounter for immunization: Secondary | ICD-10-CM | POA: Insufficient documentation

## 2019-04-21 NOTE — Progress Notes (Signed)
   Covid-19 Vaccination Clinic  Name:  CURLIE MACKEN    MRN: [id number] DOB: [birth year]  04/21/2019  Mr. Smith was observed post Covid-19 immunization for 15 minutes without incidence. He was provided with Vaccine Information Sheet and instruction to access the V-Safe system.   Mr. Conger was instructed to call 911 with any severe reactions post vaccine: Marland Kitchen Difficulty breathing  . Swelling of your face and throat  . A fast heartbeat  . A bad rash all over your body  . Dizziness and weakness    Immunizations Administered    Name Date Dose VIS Date Route   Pfizer COVID-19 Vaccine 04/21/2019  3:01 PM 0.3 mL 02/04/2019 Intramuscular   Manufacturer: [employer], [employer]   Lot: [id number]   NDC: [address]-0

## 2019-04-24 DIAGNOSIS — I2510 Atherosclerotic heart disease of native coronary artery without angina pectoris: Secondary | ICD-10-CM | POA: Diagnosis not present

## 2019-04-24 DIAGNOSIS — F329 Major depressive disorder, single episode, unspecified: Secondary | ICD-10-CM | POA: Diagnosis not present

## 2019-05-10 DIAGNOSIS — Z89612 Acquired absence of left leg above knee: Secondary | ICD-10-CM | POA: Diagnosis not present

## 2019-05-10 DIAGNOSIS — Z89611 Acquired absence of right leg above knee: Secondary | ICD-10-CM | POA: Diagnosis not present

## 2019-05-17 ENCOUNTER — Ambulatory Visit: Payer: PPO | Attending: Internal Medicine

## 2019-05-17 DIAGNOSIS — Z23 Encounter for immunization: Secondary | ICD-10-CM

## 2019-05-17 NOTE — Progress Notes (Signed)
   Covid-19 Vaccination Clinic  Name:  [name] [name]    MRN: [id number] DOB: February 03, 1941  05/17/2019  [name] was observed post Covid-19 immunization for 15 minutes without incident. He was provided with Vaccine Information Sheet and instruction to access the V-Safe system.   [name] was instructed to call 911 with any severe reactions post vaccine: Marland Kitchen Difficulty breathing  . Swelling of face and throat  . A fast heartbeat  . A bad rash all over body  . Dizziness and weakness   Immunizations Administered    Name Date Dose VIS Date Route   Pfizer COVID-19 Vaccine 05/17/2019  1:55 PM 0.3 mL 02/04/2019 Intramuscular   Manufacturer: [employer], [employer]   Lot: [id number]   NDC: [address]-0

## 2019-06-24 DIAGNOSIS — F329 Major depressive disorder, single episode, unspecified: Secondary | ICD-10-CM | POA: Diagnosis not present

## 2019-06-24 DIAGNOSIS — E119 Type 2 diabetes mellitus without complications: Secondary | ICD-10-CM | POA: Diagnosis not present

## 2019-06-24 DIAGNOSIS — I10 Essential (primary) hypertension: Secondary | ICD-10-CM | POA: Diagnosis not present

## 2019-07-25 DIAGNOSIS — D519 Vitamin B12 deficiency anemia, unspecified: Secondary | ICD-10-CM | POA: Diagnosis not present

## 2019-07-25 DIAGNOSIS — E785 Hyperlipidemia, unspecified: Secondary | ICD-10-CM | POA: Diagnosis not present

## 2019-07-25 DIAGNOSIS — I10 Essential (primary) hypertension: Secondary | ICD-10-CM | POA: Diagnosis not present

## 2019-07-25 DIAGNOSIS — F329 Major depressive disorder, single episode, unspecified: Secondary | ICD-10-CM | POA: Diagnosis not present

## 2019-08-24 DIAGNOSIS — I10 Essential (primary) hypertension: Secondary | ICD-10-CM | POA: Diagnosis not present

## 2019-08-24 DIAGNOSIS — F329 Major depressive disorder, single episode, unspecified: Secondary | ICD-10-CM | POA: Diagnosis not present

## 2019-08-24 DIAGNOSIS — D519 Vitamin B12 deficiency anemia, unspecified: Secondary | ICD-10-CM | POA: Diagnosis not present

## 2019-08-24 DIAGNOSIS — E785 Hyperlipidemia, unspecified: Secondary | ICD-10-CM | POA: Diagnosis not present

## 2019-09-23 ENCOUNTER — Other Ambulatory Visit: Payer: Self-pay | Admitting: Cardiology

## 2019-09-24 DIAGNOSIS — F329 Major depressive disorder, single episode, unspecified: Secondary | ICD-10-CM | POA: Diagnosis not present

## 2019-09-24 DIAGNOSIS — I10 Essential (primary) hypertension: Secondary | ICD-10-CM | POA: Diagnosis not present

## 2019-09-24 DIAGNOSIS — E785 Hyperlipidemia, unspecified: Secondary | ICD-10-CM | POA: Diagnosis not present

## 2019-09-24 DIAGNOSIS — D519 Vitamin B12 deficiency anemia, unspecified: Secondary | ICD-10-CM | POA: Diagnosis not present

## 2019-10-25 DIAGNOSIS — F329 Major depressive disorder, single episode, unspecified: Secondary | ICD-10-CM | POA: Diagnosis not present

## 2019-10-25 DIAGNOSIS — I10 Essential (primary) hypertension: Secondary | ICD-10-CM | POA: Diagnosis not present

## 2019-10-25 DIAGNOSIS — E785 Hyperlipidemia, unspecified: Secondary | ICD-10-CM | POA: Diagnosis not present

## 2019-10-25 DIAGNOSIS — D519 Vitamin B12 deficiency anemia, unspecified: Secondary | ICD-10-CM | POA: Diagnosis not present

## 2019-11-24 DIAGNOSIS — E785 Hyperlipidemia, unspecified: Secondary | ICD-10-CM | POA: Diagnosis not present

## 2019-11-24 DIAGNOSIS — I10 Essential (primary) hypertension: Secondary | ICD-10-CM | POA: Diagnosis not present

## 2019-11-24 DIAGNOSIS — H04123 Dry eye syndrome of bilateral lacrimal glands: Secondary | ICD-10-CM | POA: Diagnosis not present

## 2019-11-24 DIAGNOSIS — H01009 Unspecified blepharitis unspecified eye, unspecified eyelid: Secondary | ICD-10-CM | POA: Diagnosis not present

## 2019-11-24 DIAGNOSIS — F329 Major depressive disorder, single episode, unspecified: Secondary | ICD-10-CM | POA: Diagnosis not present

## 2019-11-24 DIAGNOSIS — D519 Vitamin B12 deficiency anemia, unspecified: Secondary | ICD-10-CM | POA: Diagnosis not present

## 2019-12-24 DIAGNOSIS — F329 Major depressive disorder, single episode, unspecified: Secondary | ICD-10-CM | POA: Diagnosis not present

## 2019-12-24 DIAGNOSIS — D519 Vitamin B12 deficiency anemia, unspecified: Secondary | ICD-10-CM | POA: Diagnosis not present

## 2019-12-24 DIAGNOSIS — I10 Essential (primary) hypertension: Secondary | ICD-10-CM | POA: Diagnosis not present

## 2019-12-24 DIAGNOSIS — E785 Hyperlipidemia, unspecified: Secondary | ICD-10-CM | POA: Diagnosis not present

## 2020-01-24 DIAGNOSIS — I10 Essential (primary) hypertension: Secondary | ICD-10-CM | POA: Diagnosis not present

## 2020-01-24 DIAGNOSIS — D519 Vitamin B12 deficiency anemia, unspecified: Secondary | ICD-10-CM | POA: Diagnosis not present

## 2020-01-24 DIAGNOSIS — F329 Major depressive disorder, single episode, unspecified: Secondary | ICD-10-CM | POA: Diagnosis not present

## 2020-01-24 DIAGNOSIS — E785 Hyperlipidemia, unspecified: Secondary | ICD-10-CM | POA: Diagnosis not present

## 2020-02-23 DIAGNOSIS — I10 Essential (primary) hypertension: Secondary | ICD-10-CM | POA: Diagnosis not present

## 2020-02-23 DIAGNOSIS — E785 Hyperlipidemia, unspecified: Secondary | ICD-10-CM | POA: Diagnosis not present

## 2020-03-19 DIAGNOSIS — Z89611 Acquired absence of right leg above knee: Secondary | ICD-10-CM | POA: Diagnosis not present

## 2020-03-19 DIAGNOSIS — Z89612 Acquired absence of left leg above knee: Secondary | ICD-10-CM | POA: Diagnosis not present

## 2020-03-23 ENCOUNTER — Other Ambulatory Visit: Payer: Self-pay | Admitting: Cardiology

## 2020-03-25 DIAGNOSIS — D519 Vitamin B12 deficiency anemia, unspecified: Secondary | ICD-10-CM | POA: Diagnosis not present

## 2020-03-25 DIAGNOSIS — E785 Hyperlipidemia, unspecified: Secondary | ICD-10-CM | POA: Diagnosis not present

## 2020-03-25 DIAGNOSIS — I10 Essential (primary) hypertension: Secondary | ICD-10-CM | POA: Diagnosis not present

## 2020-03-25 DIAGNOSIS — F329 Major depressive disorder, single episode, unspecified: Secondary | ICD-10-CM | POA: Diagnosis not present

## 2020-03-30 DIAGNOSIS — F419 Anxiety disorder, unspecified: Secondary | ICD-10-CM | POA: Insufficient documentation

## 2020-03-30 DIAGNOSIS — I2510 Atherosclerotic heart disease of native coronary artery without angina pectoris: Secondary | ICD-10-CM | POA: Insufficient documentation

## 2020-03-30 DIAGNOSIS — I509 Heart failure, unspecified: Secondary | ICD-10-CM | POA: Insufficient documentation

## 2020-03-30 DIAGNOSIS — G4730 Sleep apnea, unspecified: Secondary | ICD-10-CM | POA: Insufficient documentation

## 2020-03-30 DIAGNOSIS — Z01810 Encounter for preprocedural cardiovascular examination: Secondary | ICD-10-CM | POA: Insufficient documentation

## 2020-03-30 DIAGNOSIS — I6529 Occlusion and stenosis of unspecified carotid artery: Secondary | ICD-10-CM | POA: Insufficient documentation

## 2020-03-30 DIAGNOSIS — I4891 Unspecified atrial fibrillation: Secondary | ICD-10-CM | POA: Insufficient documentation

## 2020-03-30 DIAGNOSIS — D649 Anemia, unspecified: Secondary | ICD-10-CM | POA: Insufficient documentation

## 2020-03-30 DIAGNOSIS — F32A Depression, unspecified: Secondary | ICD-10-CM | POA: Insufficient documentation

## 2020-03-30 DIAGNOSIS — R569 Unspecified convulsions: Secondary | ICD-10-CM | POA: Insufficient documentation

## 2020-03-30 DIAGNOSIS — M1990 Unspecified osteoarthritis, unspecified site: Secondary | ICD-10-CM | POA: Insufficient documentation

## 2020-03-30 DIAGNOSIS — Z5189 Encounter for other specified aftercare: Secondary | ICD-10-CM | POA: Insufficient documentation

## 2020-03-30 DIAGNOSIS — J449 Chronic obstructive pulmonary disease, unspecified: Secondary | ICD-10-CM | POA: Insufficient documentation

## 2020-03-30 DIAGNOSIS — G510 Bell's palsy: Secondary | ICD-10-CM | POA: Insufficient documentation

## 2020-03-30 DIAGNOSIS — I219 Acute myocardial infarction, unspecified: Secondary | ICD-10-CM | POA: Insufficient documentation

## 2020-03-30 DIAGNOSIS — I10 Essential (primary) hypertension: Secondary | ICD-10-CM | POA: Insufficient documentation

## 2020-04-05 DIAGNOSIS — Z961 Presence of intraocular lens: Secondary | ICD-10-CM | POA: Diagnosis not present

## 2020-04-10 NOTE — Progress Notes (Unsigned)
Cardiology Office Note:    Date:  04/11/2020   ID:  [name] [name], DOB Aug 14, 1940, MRN [id number]  PCP:  [name] Ponto, MD  Cardiologist:  [name] Herrlich, MD    Referring MD: [name] Ponto, MD    ASSESSMENT:    1. CAD in native artery   2. Chronic combined systolic and diastolic heart failure (HCC)   3. Hypertensive heart disease with chronic combined systolic and diastolic congestive heart failure (HCC)   4. PAF (paroxysmal atrial fibrillation) (HCC)   5. Chronic anticoagulation   6. Heparin induced thrombocytopenia (HCC)   7. Hyperlipidemia, unspecified hyperlipidemia type   8. Stenosis of carotid artery, unspecified laterality    PLAN:    In order of problems listed above:  1. Overall is done well CAD is stable no angina continue medical therapy at this time I would not perform an ischemia evaluation. I reviewed his last coronary angiography. 2. Compensated last ejection fraction was normal but before that was severely reduced recheck echocardiogram continue therapy with loop diuretic ACE inhibitor and if his EF is severely reduced would benefit from [facility] MRA SGLT2 inhibitor. 3. Stable BP at target continue ACE inhibitor awaiting results of echo 4. Stable no clinical recurrence he is anticoagulated not on antiarrhythmic drug 5. He had previous heparin-induced thrombocytopenia I have added this as an allergy 6. Check CMP and lipid profile if triglycerides are normal and when to stop fenofibrate. 7. He has had a gap in healthcare over 2 years has not seen vascular surgery I will recheck a carotid duplex if he has severe flow-limiting stenosis would need to be referred back for consideration of surgery or stent. Note he has bilateral carotid bruits on exam   Next appointment: 6 months   Medication Adjustments/Labs and Tests Ordered: Current medicines are reviewed at length with the patient today.  Concerns regarding medicines are outlined above.  No orders of the  defined types were placed in this encounter.  No orders of the defined types were placed in this encounter.   Chief Complaint  Patient presents with  . Follow-up  . Coronary Artery Disease  . Cardiomyopathy  . Atrial Fibrillation  . Anticoagulation  . Hyperlipidemia  . PAD    History of Present Illness:    [name] [name] is a 81 y.o. male with a hx of PAD type II non-ST elevation MI CAD with EF 35 to 40% in 2017 stroke paroxysmal atrial fibrillation anticoagulated heparin-induced thrombocytopenia hyperlipidemia left AKA March 2018 last seen 12/01/2018.  He has previously followed with vascular surgery and the last note from [facility] is 04/21/2017 Compliance with diet, lifestyle and medications: Yes  Echocardiogram 01/05/2019 shows EF of 60 to 65% with moderate concentric left ventricular hypertrophy normal left ventricular filling pressure moderate left atrial enlargement and no significant valvular abnormality.  From care everywhere coronary angiography 03/26/2016 showed single-vessel CAD chronic total occlusion of the right coronary artery with was felt to be adequate left to right collaterals 60% stenosis mid left circumflex discrete and calcific LAD with 20 to 30% stenosis and previous stent patent.  He was felt to be best treated with ongoing medical therapy.  He was noted to have severe vascular calcification on fluoroscopy and had no finding of LV outflow tract obstruction or aortic stenosis.  Overall he has done well he said about a year ago he had cataract surgery has had no valvular medical interactions and has not followed up with vascular surgery. Last  labs 04/05/2019 cholesterol 153 HDL 32 triglycerides 223 creatinine 1.10 He has had no TIA visual loss palpitation or syncope He has had no edema shortness of breath or orthopnea He has had no chest pain and has not needed nitroglycerin. Had no bleeding on his anticoagulant He tolerates his statin along with  icosapent ethyl and fenofibrate without muscle pain or weakness. Past Medical History:  Diagnosis Date  . Acute systolic congestive heart failure (HCC) 02/01/2016  . Anemia   . Anxiety   . Arthritis   . Atrial fibrillation (HCC)   . Bell's palsy   . Blood transfusion without reported diagnosis   . CAD in native artery 09/19/2014   Overview:  1.s/p nonQwave MI 2001, PTCA and stent of LAD and subsequently repeat PTCA and stent of 2 lesions in LAD 08-19-00   2. MPS in March 2011 wo ischemia, EF 44%   3. Lexiscan MPS 07/07/11 wo ischemia, normal EF%  . Cardiomyopathy (HCC) 03/26/2016  . Carotid artery disease (HCC) 04/29/2016   Overview:  S/P right CEA  . Carotid artery occlusion   . CHF (congestive heart failure) (HCC)   . COPD (chronic obstructive pulmonary disease) (HCC)   . Coronary artery disease   . Demand ischemia of myocardium (HCC) 01/15/2015  . Depression   . Essential hypertension 09/19/2014  . High cholesterol 04/24/2017  . History of atrial fibrillation 04/29/2016  . HIT (heparin-induced thrombocytopenia) (HCC) 04/29/2016  . Hyperlipidemia   . Hypertension   . Impaired mobility and activities of daily living 05/12/2016  . Intermittent claudication (HCC) 02/24/2012  . Ischemic cardiomyopathy 02/01/2016   Overview:  Added automatically from request for surgery [id number]  . Lumbar spondylosis 04/24/2017  . Myocardial infarction (HCC)    X's 2  . Neuropathy 04/24/2017  . Pain in limb 02/24/2012  . PAOD (peripheral arterial occlusive disease) (HCC) 01/22/2016  . Peripheral vascular disease (HCC)   . Peripheral vascular disease, unspecified (HCC) 02/24/2012  . S/P AKA (above knee amputation) unilateral, left (HCC) 05/12/2016  . Seizures (HCC)   . Sleep apnea   . Stroke [facility])     Past Surgical History:  Procedure Laterality Date  .  stimulator  Aug. 6, 2012   Implantation of Spinal Stimulator  . ANGIOPLASTY  Dec. 2001   with stent  . APPENDECTOMY    . CAROTID ENDARTERECTOMY  Aug. 2002    RIGHT  cea  . Catherization  June 2002   Cardiac  . CHOLECYSTECTOMY     Gall Bladder- llaproscopic  . COLON SURGERY  Jan. 2009   Ischemic  . EYE SURGERY  1949  . FEMORAL BYPASS    . FINGER AMPUTATION  1960   Right  thumb  . FRACTURE SURGERY    . LEG AMPUTATION ABOVE KNEE Right   . ROTATOR CUFF REPAIR  2007   Right  shoulder  . SPINAL FUSION  Sept. 30, 2012  . SPINE SURGERY  Feb. 2001  . THROMBOENDARTERECTOMY      Current Medications: Current Meds  Medication Sig  . carbamazepine (TEGRETOL XR) 100 MG 12 hr tablet Take 100 mg by mouth 2 (two) times daily.  . cholecalciferol (VITAMIN D) 1000 UNITS tablet Take 1,000 Units by mouth daily. Reported on 02/21/2015  . Cyanocobalamin (VITAMIN B-12) 5000 MCG TBDP Take 1 tablet by mouth daily.  Marland Kitchen ELIQUIS 5 MG TABS tablet Take 5 mg by mouth 2 (two) times daily.  . fenofibrate 160 MG tablet Take 160 mg by mouth daily.  Marland Kitchen  folic acid (FOLVITE) 1 MG tablet Take 1 mg by mouth daily.  . furosemide (LASIX) 20 MG tablet TAKE ONE TABLET BY MOUTH EVERY MORNING  . gabapentin (NEURONTIN) 300 MG capsule Take 1,200 mg by mouth 3 (three) times daily.   Marland Kitchen lisinopril (PRINIVIL,ZESTRIL) 5 MG tablet Take 5 mg by mouth daily.  . Multiple Vitamin (MULTIVITAMIN) tablet Take 1 tablet by mouth daily.  . rosuvastatin (CRESTOR) 40 MG tablet Take 40 mg by mouth daily.  Marland Kitchen VASCEPA 1 g CAPS TAKE 2 (TWO) CAPSULE TWICE DAILYFOR HIGH TRIGLYCERIDE LEVEL     Allergies:   Codeine, Heparin, Percocet [oxycodone-acetaminophen], Promethazine hcl, Promethazine hcl, Buprenorphine hcl, Morphine and related, Amitriptyline, and Lyrica [pregabalin]   Social History   Socioeconomic History  . Marital status: Married    Spouse name: Not on file  . Number of children: Not on file  . Years of education: Not on file  . Highest education level: Not on file  Occupational History  . Not on file  Tobacco Use  . Smoking status: Former Smoker    Types: Cigarettes    Start date:  02/24/2001  . Smokeless tobacco: Never Used  Vaping Use  . Vaping Use: Never used  Substance and Sexual Activity  . Alcohol use: No  . Drug use: No  . Sexual activity: Not on file  Other Topics Concern  . Not on file  Social History Narrative  . Not on file   Social Determinants of Health   Financial Resource Strain: Not on file  Food Insecurity: Not on file  Transportation Needs: Not on file  Physical Activity: Not on file  Stress: Not on file  Social Connections: Not on file     Family History: The patient's family history includes Cancer in his daughter and sister; Deep vein thrombosis in his son; Heart attack in his father and son; Heart disease in his father and son; Hyperlipidemia in his brother, father, and son; Hypertension in his father, mother, sister, and son. ROS:   Please see the history of present illness.    All other systems reviewed and are negative.  EKGs/Labs/Other Studies Reviewed:    The following studies were reviewed today:  EKG:  EKG ordered today and personally reviewed.  The ekg ordered today demonstrates this rhythm frequent PVCs trigeminy and nonspecific ST change   Physical Exam:    VS:  BP (!) 156/60 (BP Location: Left Arm)   Pulse 73   SpO2 98%     Wt Readings from Last 3 Encounters:  05/27/16 143 lb (64.9 kg)  02/26/16 164 lb (74.4 kg)  02/21/15 164 lb (74.4 kg)     GEN:  Well nourished, well developed in no acute distress HEENT: Normal NECK: No JVD; he has bilateral carotid bruits LYMPHATICS: No lymphadenopathy CARDIAC: RRR, no murmurs, rubs, gallops RESPIRATORY:  Clear to auscultation without rales, wheezing or rhonchi  ABDOMEN: Soft, non-tender, non-distended MUSCULOSKELETAL:  No edema; No deformity is had bilateral AKA SKIN: Warm and dry NEUROLOGIC:  Alert and oriented x 3 PSYCHIATRIC:  Normal affect    Signed, [name] Herrlich, MD  04/11/2020 11:03 AM    [facility]

## 2020-04-11 ENCOUNTER — Encounter: Payer: Self-pay | Admitting: Cardiology

## 2020-04-11 ENCOUNTER — Ambulatory Visit: Payer: PPO | Admitting: Cardiology

## 2020-04-11 ENCOUNTER — Other Ambulatory Visit: Payer: Self-pay

## 2020-04-11 VITALS — BP 156/60 | HR 73

## 2020-04-11 DIAGNOSIS — I110 Hypertensive heart disease with heart failure: Secondary | ICD-10-CM | POA: Diagnosis not present

## 2020-04-11 DIAGNOSIS — I2510 Atherosclerotic heart disease of native coronary artery without angina pectoris: Secondary | ICD-10-CM | POA: Diagnosis not present

## 2020-04-11 DIAGNOSIS — D75829 Heparin-induced thrombocytopenia, unspecified: Secondary | ICD-10-CM

## 2020-04-11 DIAGNOSIS — I5042 Chronic combined systolic (congestive) and diastolic (congestive) heart failure: Secondary | ICD-10-CM | POA: Diagnosis not present

## 2020-04-11 DIAGNOSIS — Z7901 Long term (current) use of anticoagulants: Secondary | ICD-10-CM | POA: Diagnosis not present

## 2020-04-11 DIAGNOSIS — E785 Hyperlipidemia, unspecified: Secondary | ICD-10-CM | POA: Diagnosis not present

## 2020-04-11 DIAGNOSIS — I480 Paroxysmal atrial fibrillation: Secondary | ICD-10-CM

## 2020-04-11 DIAGNOSIS — D7582 Heparin induced thrombocytopenia (HIT): Secondary | ICD-10-CM

## 2020-04-11 DIAGNOSIS — I6529 Occlusion and stenosis of unspecified carotid artery: Secondary | ICD-10-CM | POA: Diagnosis not present

## 2020-04-11 MED ORDER — NITROGLYCERIN 0.4 MG SL SUBL: 0.4000 mg | SUBLINGUAL_TABLET | SUBLINGUAL | 3 refills | Status: AC | PRN

## 2020-04-11 NOTE — Patient Instructions (Signed)
Medication Instructions:  Your physician has recommended you make the following change in your medication:  START: Nitroglycerin 0.4 mg take one tablet by mouth every 5 minutes up to three times as needed for chest pain.  *If you need a refill on your cardiac medications before your next appointment, please call your pharmacy*   Lab Work: Your physician recommends that you return for lab work in: TODAY CBVC, CMP, Lipids, ProBNP If you have labs (blood work) drawn today and your tests are completely normal, you will receive your results only by: Marland Kitchen MyChart Message (if you have MyChart) OR . A paper copy in the mail If you have any lab test that is abnormal or we need to change your treatment, we will call you to review the results.   Testing/Procedures: Your physician has requested that you have an echocardiogram. Echocardiography is a painless test that uses sound waves to create images of your heart. It provides your doctor with information about the size and shape of your heart and how well your heart's chambers and valves are working. This procedure takes approximately one hour. There are no restrictions for this procedure.  Your physician has requested that you have a carotid duplex. This test is an ultrasound of the carotid arteries in your neck. It looks at blood flow through these arteries that supply the brain with blood. Allow one hour for this exam. There are no restrictions or special instructions.    Follow-Up: At [facility], you and your health needs are our priority.  As part of our continuing mission to provide you with exceptional heart care, we have created designated Provider Care Teams.  These Care Teams include your primary Cardiologist (physician) and Advanced Practice Providers (APPs -  Physician Assistants and Nurse Practitioners) who all work together to provide you with the care you need, when you need it.  We recommend signing up for the patient portal called  "MyChart".  Sign up information is provided on this After Visit Summary.  MyChart is used to connect with patients for Virtual Visits (Telemedicine).  Patients are able to view lab/test results, encounter notes, upcoming appointments, etc.  Non-urgent messages can be sent to your provider as well.   To learn more about what you can do with MyChart, go to [email or web address].    Your next appointment:   6 month(s)  The format for your next appointment:   In Person  Provider:   Norman Herrlich, MD   Other Instructions

## 2020-04-11 NOTE — Addendum Note (Signed)
Addended by: Delorse Limber I on: 04/11/2020 11:10 AM   Modules accepted: Orders

## 2020-04-12 ENCOUNTER — Telehealth: Payer: Self-pay

## 2020-04-12 LAB — CBC
Hematocrit: 37 % — ABNORMAL LOW (ref 37.5–51.0)
Hemoglobin: 12.1 g/dL — ABNORMAL LOW (ref 13.0–17.7)
MCH: 30.5 pg (ref 26.6–33.0)
MCHC: 32.7 g/dL (ref 31.5–35.7)
MCV: 93 fL (ref 79–97)
Platelets: 183 10*3/uL (ref 150–450)
RBC: 3.97 x10E6/uL — ABNORMAL LOW (ref 4.14–5.80)
RDW: 12.7 % (ref 11.6–15.4)
WBC: 5.4 10*3/uL (ref 3.4–10.8)

## 2020-04-12 LAB — COMPREHENSIVE METABOLIC PANEL
ALT: 22 IU/L (ref 0–44)
AST: 23 IU/L (ref 0–40)
Albumin/Globulin Ratio: 1.8 (ref 1.2–2.2)
Albumin: 4.4 g/dL (ref 3.7–4.7)
Alkaline Phosphatase: 49 IU/L (ref 44–121)
BUN/Creatinine Ratio: 18 (ref 10–24)
BUN: 23 mg/dL (ref 8–27)
Bilirubin Total: 0.2 mg/dL (ref 0.0–1.2)
CO2: 24 mmol/L (ref 20–29)
Calcium: 9.9 mg/dL (ref 8.6–10.2)
Chloride: 105 mmol/L (ref 96–106)
Creatinine, Ser: 1.25 mg/dL (ref 0.76–1.27)
GFR calc Af Amer: 62 mL/min/{1.73_m2} (ref >59)
GFR calc non Af Amer: 54 mL/min/{1.73_m2} — ABNORMAL LOW (ref >59)
Globulin, Total: 2.4 g/dL (ref 1.5–4.5)
Glucose: 87 mg/dL (ref 65–99)
Potassium: 4.4 mmol/L (ref 3.5–5.2)
Sodium: 143 mmol/L (ref 134–144)
Total Protein: 6.8 g/dL (ref 6.0–8.5)

## 2020-04-12 LAB — PRO B NATRIURETIC PEPTIDE: NT-Pro BNP: 207 pg/mL (ref 0–486)

## 2020-04-12 LAB — LIPID PANEL
Chol/HDL Ratio: 4 ratio (ref 0.0–5.0)
Cholesterol, Total: 137 mg/dL (ref 100–199)
HDL: 34 mg/dL — ABNORMAL LOW (ref >39)
LDL Chol Calc (NIH): 74 mg/dL (ref 0–99)
Triglycerides: 171 mg/dL — ABNORMAL HIGH (ref 0–149)
VLDL Cholesterol Cal: 29 mg/dL (ref 5–40)

## 2020-04-12 NOTE — Telephone Encounter (Signed)
Spoke with patient regarding results and recommendation.  Patient verbalizes understanding and is agreeable to plan of care. Advised patient to call back with any issues or concerns.  

## 2020-04-12 NOTE — Telephone Encounter (Signed)
-----   Message from Baldo Daub, MD sent at 04/12/2020  7:45 AM EST ----- Good results no changes

## 2020-04-23 DIAGNOSIS — I10 Essential (primary) hypertension: Secondary | ICD-10-CM | POA: Diagnosis not present

## 2020-04-23 DIAGNOSIS — D510 Vitamin B12 deficiency anemia due to intrinsic factor deficiency: Secondary | ICD-10-CM | POA: Diagnosis not present

## 2020-04-23 DIAGNOSIS — E785 Hyperlipidemia, unspecified: Secondary | ICD-10-CM | POA: Diagnosis not present

## 2020-04-23 DIAGNOSIS — F329 Major depressive disorder, single episode, unspecified: Secondary | ICD-10-CM | POA: Diagnosis not present

## 2020-05-01 ENCOUNTER — Other Ambulatory Visit: Payer: Self-pay | Admitting: Cardiology

## 2020-05-11 ENCOUNTER — Other Ambulatory Visit: Payer: Self-pay

## 2020-05-11 ENCOUNTER — Ambulatory Visit (INDEPENDENT_AMBULATORY_CARE_PROVIDER_SITE_OTHER): Payer: PPO

## 2020-05-11 DIAGNOSIS — I2510 Atherosclerotic heart disease of native coronary artery without angina pectoris: Secondary | ICD-10-CM

## 2020-05-11 DIAGNOSIS — D7582 Heparin induced thrombocytopenia (HIT): Secondary | ICD-10-CM

## 2020-05-11 DIAGNOSIS — I5042 Chronic combined systolic (congestive) and diastolic (congestive) heart failure: Secondary | ICD-10-CM

## 2020-05-11 DIAGNOSIS — I6522 Occlusion and stenosis of left carotid artery: Secondary | ICD-10-CM | POA: Diagnosis not present

## 2020-05-11 DIAGNOSIS — D75829 Heparin-induced thrombocytopenia, unspecified: Secondary | ICD-10-CM

## 2020-05-11 DIAGNOSIS — Z7901 Long term (current) use of anticoagulants: Secondary | ICD-10-CM | POA: Diagnosis not present

## 2020-05-11 DIAGNOSIS — I6529 Occlusion and stenosis of unspecified carotid artery: Secondary | ICD-10-CM

## 2020-05-11 DIAGNOSIS — I110 Hypertensive heart disease with heart failure: Secondary | ICD-10-CM

## 2020-05-11 DIAGNOSIS — I480 Paroxysmal atrial fibrillation: Secondary | ICD-10-CM | POA: Diagnosis not present

## 2020-05-11 DIAGNOSIS — E785 Hyperlipidemia, unspecified: Secondary | ICD-10-CM | POA: Diagnosis not present

## 2020-05-11 NOTE — Progress Notes (Signed)
Complete echocardiogram performed.  Jimmy David Rodriquez RDCS, RVT  

## 2020-05-11 NOTE — Progress Notes (Signed)
Carotid duplex exam has been performed.  Jimmy Chistian Kasler RDCS, RVT 

## 2020-05-13 LAB — ECHOCARDIOGRAM COMPLETE
AR max vel: 1.24 cm2
AV Area VTI: 1.26 cm2
AV Area mean vel: 1.2 cm2
AV Mean grad: 12.5 mmHg
AV Peak grad: 24.5 mmHg
Ao pk vel: 2.48 m/s
Area-P 1/2: 1.93 cm2
S' Lateral: 3.2 cm

## 2020-05-14 ENCOUNTER — Telehealth: Payer: Self-pay

## 2020-05-14 NOTE — Telephone Encounter (Signed)
Spoke with patient regarding results and recommendation.  Patient verbalizes understanding and is agreeable to plan of care. Advised patient to call back with any issues or concerns.  

## 2020-05-14 NOTE — Telephone Encounter (Signed)
-----   Message from Baldo Daub, MD sent at 05/14/2020  7:52 AM EDT ----- This is a good report heart muscle function remains normal.  There is very mild restriction of the aortic valve, stenosis and on think that this is a problem in the intermediate future and will need to recheck in about 2 years.

## 2020-05-23 DIAGNOSIS — D510 Vitamin B12 deficiency anemia due to intrinsic factor deficiency: Secondary | ICD-10-CM | POA: Diagnosis not present

## 2020-05-23 DIAGNOSIS — E785 Hyperlipidemia, unspecified: Secondary | ICD-10-CM | POA: Diagnosis not present

## 2020-05-23 DIAGNOSIS — I10 Essential (primary) hypertension: Secondary | ICD-10-CM | POA: Diagnosis not present

## 2020-05-23 DIAGNOSIS — F329 Major depressive disorder, single episode, unspecified: Secondary | ICD-10-CM | POA: Diagnosis not present

## 2020-05-29 DIAGNOSIS — F3342 Major depressive disorder, recurrent, in full remission: Secondary | ICD-10-CM | POA: Diagnosis not present

## 2020-05-29 DIAGNOSIS — I2510 Atherosclerotic heart disease of native coronary artery without angina pectoris: Secondary | ICD-10-CM | POA: Diagnosis not present

## 2020-05-29 DIAGNOSIS — E261 Secondary hyperaldosteronism: Secondary | ICD-10-CM | POA: Diagnosis not present

## 2020-05-29 DIAGNOSIS — I509 Heart failure, unspecified: Secondary | ICD-10-CM | POA: Diagnosis not present

## 2020-05-29 DIAGNOSIS — G4733 Obstructive sleep apnea (adult) (pediatric): Secondary | ICD-10-CM | POA: Diagnosis not present

## 2020-05-29 DIAGNOSIS — I110 Hypertensive heart disease with heart failure: Secondary | ICD-10-CM | POA: Diagnosis not present

## 2020-05-29 DIAGNOSIS — J449 Chronic obstructive pulmonary disease, unspecified: Secondary | ICD-10-CM | POA: Diagnosis not present

## 2020-05-29 DIAGNOSIS — D6869 Other thrombophilia: Secondary | ICD-10-CM | POA: Diagnosis not present

## 2020-05-29 DIAGNOSIS — I480 Paroxysmal atrial fibrillation: Secondary | ICD-10-CM | POA: Diagnosis not present

## 2020-05-29 DIAGNOSIS — G546 Phantom limb syndrome with pain: Secondary | ICD-10-CM | POA: Diagnosis not present

## 2020-05-29 DIAGNOSIS — D692 Other nonthrombocytopenic purpura: Secondary | ICD-10-CM | POA: Diagnosis not present

## 2020-05-29 DIAGNOSIS — I739 Peripheral vascular disease, unspecified: Secondary | ICD-10-CM | POA: Diagnosis not present

## 2020-07-23 DIAGNOSIS — E785 Hyperlipidemia, unspecified: Secondary | ICD-10-CM | POA: Diagnosis not present

## 2020-07-23 DIAGNOSIS — D510 Vitamin B12 deficiency anemia due to intrinsic factor deficiency: Secondary | ICD-10-CM | POA: Diagnosis not present

## 2020-07-23 DIAGNOSIS — F329 Major depressive disorder, single episode, unspecified: Secondary | ICD-10-CM | POA: Diagnosis not present

## 2020-07-23 DIAGNOSIS — I10 Essential (primary) hypertension: Secondary | ICD-10-CM | POA: Diagnosis not present

## 2020-08-23 DIAGNOSIS — F329 Major depressive disorder, single episode, unspecified: Secondary | ICD-10-CM | POA: Diagnosis not present

## 2020-08-23 DIAGNOSIS — E785 Hyperlipidemia, unspecified: Secondary | ICD-10-CM | POA: Diagnosis not present

## 2020-08-23 DIAGNOSIS — D510 Vitamin B12 deficiency anemia due to intrinsic factor deficiency: Secondary | ICD-10-CM | POA: Diagnosis not present

## 2020-08-23 DIAGNOSIS — I10 Essential (primary) hypertension: Secondary | ICD-10-CM | POA: Diagnosis not present

## 2020-10-16 ENCOUNTER — Other Ambulatory Visit: Payer: Self-pay

## 2020-10-16 ENCOUNTER — Ambulatory Visit: Payer: PPO | Admitting: Cardiology

## 2020-10-16 ENCOUNTER — Encounter: Payer: Self-pay | Admitting: Cardiology

## 2020-10-16 VITALS — BP 183/54 | HR 80

## 2020-10-16 DIAGNOSIS — Z7901 Long term (current) use of anticoagulants: Secondary | ICD-10-CM | POA: Diagnosis not present

## 2020-10-16 DIAGNOSIS — I480 Paroxysmal atrial fibrillation: Secondary | ICD-10-CM

## 2020-10-16 DIAGNOSIS — I6529 Occlusion and stenosis of unspecified carotid artery: Secondary | ICD-10-CM | POA: Diagnosis not present

## 2020-10-16 DIAGNOSIS — I2510 Atherosclerotic heart disease of native coronary artery without angina pectoris: Secondary | ICD-10-CM

## 2020-10-16 DIAGNOSIS — I110 Hypertensive heart disease with heart failure: Secondary | ICD-10-CM

## 2020-10-16 DIAGNOSIS — I5042 Chronic combined systolic (congestive) and diastolic (congestive) heart failure: Secondary | ICD-10-CM | POA: Diagnosis not present

## 2020-10-16 MED ORDER — LISINOPRIL 10 MG PO TABS: 10.0000 mg | ORAL_TABLET | Freq: Every day | ORAL | 3 refills | Status: DC

## 2020-10-16 NOTE — Patient Instructions (Signed)
Medication Instructions:  Your physician has recommended you make the following change in your medication:  START: Lisinopril 10 mg once daily  Check your blood pressure daily for 2 weeks in your left arm and send weekly in MyChart.   *If you need a refill on your cardiac medications before your next appointment, please call your pharmacy*   Lab Work: Your physician recommends that you return for lab work in:  TODAY: CMP, Lipids If you have labs (blood work) drawn today and your tests are completely normal, you will receive your results only by: MyChart Message (if you have MyChart) OR A paper copy in the mail If you have any lab test that is abnormal or we need to change your treatment, we will call you to review the results.   Testing/Procedures: Your physician has requested that you have a carotid duplex. This test is an ultrasound of the carotid arteries in your neck. It looks at blood flow through these arteries that supply the brain with blood. Allow one hour for this exam. There are no restrictions or special instructions.    Follow-Up: At [facility], you and your health needs are our priority.  As part of our continuing mission to provide you with exceptional heart care, we have created designated Provider Care Teams.  These Care Teams include your primary Cardiologist (physician) and Advanced Practice Providers (APPs -  Physician Assistants and Nurse Practitioners) who all work together to provide you with the care you need, when you need it.  We recommend signing up for the patient portal called "MyChart".  Sign up information is provided on this After Visit Summary.  MyChart is used to connect with patients for Virtual Visits (Telemedicine).  Patients are able to view lab/test results, encounter notes, upcoming appointments, etc.  Non-urgent messages can be sent to your provider as well.   To learn more about what you can do with MyChart, go to [email or web address].     Your next appointment:   9 month(s)  The format for your next appointment:   In Person  Provider:   Norman Herrlich, MD   Other Instructions

## 2020-10-16 NOTE — Progress Notes (Signed)
Cardiology Office Note:    Date:  10/16/2020   ID:  [name] [name], DOB [birth year], MRN [id number]  PCP:  [name] Ponto, MD  Cardiologist:  [name] Herrlich, MD    Referring MD: [name] Ponto, MD    ASSESSMENT:    1. Hypertensive heart disease with chronic combined systolic and diastolic congestive heart failure (HCC)   2. CAD in native artery   3. PAF (paroxysmal atrial fibrillation) (HCC)   4. Chronic anticoagulation   5. Stenosis of carotid artery, unspecified laterality    PLAN:    In order of problems listed above:  BP not at target, will increase his dose of lisinopril in his life and track blood pressure for 2 weeks and leave a list through MyChart if systolic remains greater than 397 either switch to ARB or add calcium channel blockers. Stable CAD no anginal discomfort continue medical treatment including lipid-lowering with rosuvastatin fenofibric and Vascepa Stable no clinical recurrence continue his anticoagulant Stable we will plan to recheck his carotid duplex next March   Next appointment: 9 months   Medication Adjustments/Labs and Tests Ordered: Current medicines are reviewed at length with the patient today.  Concerns regarding medicines are outlined above.  No orders of the defined types were placed in this encounter.  No orders of the defined types were placed in this encounter.   Chief Complaint  Patient presents with   Follow-up   Coronary Artery Disease   PAD     History of Present Illness:    [name] [name] is a 80 y.o. male with a hx of PAD type II non-ST elevation MI CAD with EF 35 to 40% in 2017 stroke paroxysmal atrial fibrillation anticoagulated heparin-induced thrombocytopenia hyperlipidemia left AKA March 2018   last seen 04/11/2020.  Compliance with diet, lifestyle and medications: Yes  He continues to do well no angina edema shortness of breath palpitation or syncope. He has big blood pressure gap between the arms no longer  checks blood pressure at home repeat by me at the end of the visit 154/60 left upper extremity. Most recent labs from his PCP office 04/11/2020 shows lipids  Cholesterol 137 LDL 74 triglycerides 171 HDL 34 hemoglobin 11.2 creatinine 1.25 potassium 4.4. He tolerates his anticoagulant without any bleeding He tolerates his statin without muscle pain or weakness  Echocardiogram 01/05/2019 shows EF of 60 to 65% with moderate concentric left ventricular hypertrophy normal left ventricular filling pressure moderate left atrial enlargement and no significant valvular abnormality.   From care everywhere coronary angiography 03/26/2016 showed single-vessel CAD chronic total occlusion of the right coronary artery with was felt to be adequate left to right collaterals 60% stenosis mid left circumflex discrete and calcific LAD with 20 to 30% stenosis and previous stent patent.  He was felt to be best treated with ongoing medical therapy.  He was noted to have severe vascular calcification on fluoroscopy and had no finding of LV outflow tract obstruction or aortic stenosis  Echocardiogram 05/11/2020 showed normal left ventricular size moderate concentric LVH normal systolic function EF 60 to 65% and grade 1 diastolic dysfunction.  Right ventricle is normal in size function and normal pulmonary artery pressure left atrium is moderately dilated the right atrium mildly dilated.  Mild aortic stenosis was seen with a mean gradient of 13 mmHg.  His thoracic aorta was normal.  Cerebrovascular duplex arterial 05/11/2020 showed diffuse atherosclerosis 1 to 39% right internal carotid artery stenosis and 60 to 79% left external carotid  artery stenosis with greater than 50% ECA stenosis on that side. Past Medical History:  Diagnosis Date   Acute systolic congestive heart failure (HCC) 02/01/2016   Anemia    Anxiety    Arthritis    Atrial fibrillation (HCC)    Bell's palsy    Blood transfusion without reported diagnosis     CAD in native artery 09/19/2014   Overview:  1.s/p nonQwave MI 2001, PTCA and stent of LAD and subsequently repeat PTCA and stent of 2 lesions in LAD 08-19-00   2. MPS in March 2011 wo ischemia, EF 44%   3. Lexiscan MPS 07/07/11 wo ischemia, normal EF%   Cardiomyopathy (HCC) 03/26/2016   Carotid artery disease (HCC) 04/29/2016   Overview:  S/P right CEA   Carotid artery occlusion    CHF (congestive heart failure) (HCC)    COPD (chronic obstructive pulmonary disease) (HCC)    Coronary artery disease    Demand ischemia of myocardium (HCC) 01/15/2015   Depression    Essential hypertension 09/19/2014   High cholesterol 04/24/2017   History of atrial fibrillation 04/29/2016   HIT (heparin-induced thrombocytopenia) (HCC) 04/29/2016   Hyperlipidemia    Hypertension    Impaired mobility and activities of daily living 05/12/2016   Intermittent claudication (HCC) 02/24/2012   Ischemic cardiomyopathy 02/01/2016   Overview:  Added automatically from request for surgery [id number]   Lumbar spondylosis 04/24/2017   Myocardial infarction (HCC)    X's 2   Neuropathy 04/24/2017   Pain in limb 02/24/2012   PAOD (peripheral arterial occlusive disease) (HCC) 01/22/2016   Peripheral vascular disease (HCC)    Peripheral vascular disease, unspecified (HCC) 02/24/2012   S/P AKA (above knee amputation) unilateral, left (HCC) 05/12/2016   Seizures (HCC)    Sleep apnea    Stroke [facility])     Past Surgical History:  Procedure Laterality Date    stimulator  Aug. 6, 2012   Implantation of Spinal Stimulator   ANGIOPLASTY  Dec. 2001   with stent   APPENDECTOMY     CAROTID ENDARTERECTOMY  Aug. 2002   RIGHT  cea   Catherization  June 2002   Cardiac   CHOLECYSTECTOMY     Gall Bladder- llaproscopic   COLON SURGERY  Jan. 2009   Ischemic   EYE SURGERY  1949   FEMORAL BYPASS     FINGER AMPUTATION  1960   Right  thumb   FRACTURE SURGERY     LEG AMPUTATION ABOVE KNEE Right    ROTATOR CUFF REPAIR  2007   Right  shoulder    SPINAL FUSION  Sept. 30, 2012   SPINE SURGERY  Feb. 2001   THROMBOENDARTERECTOMY      Current Medications: Current Meds  Medication Sig   carbamazepine (TEGRETOL XR) 100 MG 12 hr tablet Take 100 mg by mouth 2 (two) times daily.   cholecalciferol (VITAMIN D) 1000 UNITS tablet Take 1,000 Units by mouth daily. Reported on 02/21/2015   Cyanocobalamin (VITAMIN B-12) 5000 MCG TBDP Take 1 tablet by mouth daily.   ELIQUIS 5 MG TABS tablet Take 5 mg by mouth 2 (two) times daily.   fenofibrate 160 MG tablet Take 160 mg by mouth daily.   folic acid (FOLVITE) 1 MG tablet Take 1 mg by mouth daily.   furosemide (LASIX) 20 MG tablet TAKE ONE TABLET BY MOUTH EVERY MORNING   gabapentin (NEURONTIN) 300 MG capsule Take 1,200 mg by mouth 3 (three) times daily.    lisinopril (PRINIVIL,ZESTRIL) 5 MG tablet  Take 5 mg by mouth daily.   Multiple Vitamin (MULTIVITAMIN) tablet Take 1 tablet by mouth daily.   nitroGLYCERIN (NITROSTAT) 0.4 MG SL tablet Place 1 tablet (0.4 mg total) under the tongue every 5 (five) minutes as needed for chest pain.   rosuvastatin (CRESTOR) 40 MG tablet Take 40 mg by mouth daily.   VASCEPA 1 g CAPS TAKE 2 (TWO) CAPSULE TWICE DAILYFOR HIGH TRIGLYCERIDE LEVEL     Allergies:   Codeine, Heparin, Percocet [oxycodone-acetaminophen], Promethazine hcl, Promethazine hcl, Buprenorphine hcl, Morphine and related, Amitriptyline, and Lyrica [pregabalin]   Social History   Socioeconomic History   Marital status: Married    Spouse name: Not on file   Number of children: Not on file   Years of education: Not on file   Highest education level: Not on file  Occupational History   Not on file  Tobacco Use   Smoking status: Former    Types: Cigarettes    Start date: 02/24/2001   Smokeless tobacco: Never  Vaping Use   Vaping Use: Never used  Substance and Sexual Activity   Alcohol use: No   Drug use: No   Sexual activity: Not on file  Other Topics Concern   Not on file  Social History  Narrative   Not on file   Social Determinants of Health   Financial Resource Strain: Not on file  Food Insecurity: Not on file  Transportation Needs: Not on file  Physical Activity: Not on file  Stress: Not on file  Social Connections: Not on file     Family History: The patient's family history includes Cancer in his daughter and sister; Deep vein thrombosis in his son; Heart attack in his father and son; Heart disease in his father and son; Hyperlipidemia in his brother, father, and son; Hypertension in his father, mother, sister, and son. ROS:   Please see the history of present illness.    All other systems reviewed and are negative.  EKGs/Labs/Other Studies Reviewed:    The following studies were reviewed today:   Recent Labs: 04/11/2020: ALT 22; BUN 23; Creatinine, Ser 1.25; Hemoglobin 12.1; NT-Pro BNP 207; Platelets 183; Potassium 4.4; Sodium 143  Recent Lipid Panel    Component Value Date/Time   CHOL 137 04/11/2020 1120   TRIG 171 (H) 04/11/2020 1120   HDL 34 (L) 04/11/2020 1120   CHOLHDL 4.0 04/11/2020 1120   LDLCALC 74 04/11/2020 1120    Physical Exam:    VS:  BP (!) 183/54 (BP Location: Left Arm, Patient Position: Sitting, Cuff Size: Normal)   Pulse 80   SpO2 91%     Wt Readings from Last 3 Encounters:  05/27/16 143 lb (64.9 kg)  02/26/16 164 lb (74.4 kg)  02/21/15 164 lb (74.4 kg)     GEN:  Well nourished, well developed in no acute distress HEENT: Normal NECK: No JVD; No carotid bruits LYMPHATICS: No lymphadenopathy CARDIAC: RRR, no murmurs, rubs, gallops RESPIRATORY:  Clear to auscultation without rales, wheezing or rhonchi  ABDOMEN: Soft, non-tender, non-distended MUSCULOSKELETAL:  No edema; No deformity bilateral AKA SKIN: Warm and dry NEUROLOGIC:  Alert and oriented x 3 PSYCHIATRIC:  Normal affect    Signed, [name] Herrlich, MD  10/16/2020 1:38 PM    [facility]

## 2020-10-16 NOTE — Addendum Note (Signed)
Addended by: Reynolds Bowl on: 10/16/2020 01:50 PM   Modules accepted: Orders

## 2020-10-17 LAB — LIPID PANEL
Chol/HDL Ratio: 5.4 ratio — ABNORMAL HIGH (ref 0.0–5.0)
Cholesterol, Total: 157 mg/dL (ref 100–199)
HDL: 29 mg/dL — ABNORMAL LOW (ref >39)
LDL Chol Calc (NIH): 90 mg/dL (ref 0–99)
Triglycerides: 225 mg/dL — ABNORMAL HIGH (ref 0–149)
VLDL Cholesterol Cal: 38 mg/dL (ref 5–40)

## 2020-10-17 LAB — COMPREHENSIVE METABOLIC PANEL
ALT: 18 IU/L (ref 0–44)
AST: 22 IU/L (ref 0–40)
Albumin/Globulin Ratio: 1.8 (ref 1.2–2.2)
Albumin: 4.3 g/dL (ref 3.7–4.7)
Alkaline Phosphatase: 50 IU/L (ref 44–121)
BUN/Creatinine Ratio: 23 (ref 10–24)
BUN: 25 mg/dL (ref 8–27)
Bilirubin Total: 0.4 mg/dL (ref 0.0–1.2)
CO2: 22 mmol/L (ref 20–29)
Calcium: 10 mg/dL (ref 8.6–10.2)
Chloride: 100 mmol/L (ref 96–106)
Creatinine, Ser: 1.11 mg/dL (ref 0.76–1.27)
Globulin, Total: 2.4 g/dL (ref 1.5–4.5)
Glucose: 91 mg/dL (ref 65–99)
Potassium: 4 mmol/L (ref 3.5–5.2)
Sodium: 139 mmol/L (ref 134–144)
Total Protein: 6.7 g/dL (ref 6.0–8.5)
eGFR: 67 mL/min/{1.73_m2} (ref >59)

## 2020-10-18 ENCOUNTER — Other Ambulatory Visit: Payer: Self-pay | Admitting: Cardiology

## 2020-10-18 ENCOUNTER — Telehealth: Payer: Self-pay | Admitting: Cardiology

## 2020-10-18 NOTE — Telephone Encounter (Signed)
Left a message (ok per [facility]) prescription sent on 08/23

## 2020-10-18 NOTE — Telephone Encounter (Signed)
*  STAT* If patient is at the pharmacy, call can be transferred to refill team.   1. Which medications need to be refilled? (please list name of each medication and dose if known) lisinopril (ZESTRIL) 10 MG tablet  2. Which pharmacy/location (including street and city if local pharmacy) is medication to be sent to? [facility] - [place], [place] - [id number]   3. Do they need a 30 day or 90 day supply? 52  Patient's wife called to say the pharmacy didn't get a prescription for the new dosage Dr. Dulce Sellar change the patient to.

## 2020-10-24 DIAGNOSIS — D510 Vitamin B12 deficiency anemia due to intrinsic factor deficiency: Secondary | ICD-10-CM | POA: Diagnosis not present

## 2020-10-24 DIAGNOSIS — Z6826 Body mass index (BMI) 26.0-26.9, adult: Secondary | ICD-10-CM | POA: Diagnosis not present

## 2020-10-24 DIAGNOSIS — E785 Hyperlipidemia, unspecified: Secondary | ICD-10-CM | POA: Diagnosis not present

## 2020-10-24 DIAGNOSIS — I4891 Unspecified atrial fibrillation: Secondary | ICD-10-CM | POA: Diagnosis not present

## 2020-10-24 DIAGNOSIS — Z1331 Encounter for screening for depression: Secondary | ICD-10-CM | POA: Diagnosis not present

## 2020-10-24 DIAGNOSIS — Z0000 Encounter for general adult medical examination without abnormal findings: Secondary | ICD-10-CM | POA: Diagnosis not present

## 2020-10-24 DIAGNOSIS — Z79899 Other long term (current) drug therapy: Secondary | ICD-10-CM | POA: Diagnosis not present

## 2020-10-24 DIAGNOSIS — I739 Peripheral vascular disease, unspecified: Secondary | ICD-10-CM | POA: Diagnosis not present

## 2020-10-24 DIAGNOSIS — S78111A Complete traumatic amputation at level between right hip and knee, initial encounter: Secondary | ICD-10-CM | POA: Diagnosis not present

## 2020-10-24 DIAGNOSIS — I10 Essential (primary) hypertension: Secondary | ICD-10-CM | POA: Diagnosis not present

## 2020-10-24 DIAGNOSIS — F329 Major depressive disorder, single episode, unspecified: Secondary | ICD-10-CM | POA: Diagnosis not present

## 2020-11-20 DIAGNOSIS — G546 Phantom limb syndrome with pain: Secondary | ICD-10-CM | POA: Diagnosis not present

## 2020-11-20 DIAGNOSIS — Z8616 Personal history of COVID-19: Secondary | ICD-10-CM | POA: Diagnosis not present

## 2020-11-20 DIAGNOSIS — J449 Chronic obstructive pulmonary disease, unspecified: Secondary | ICD-10-CM | POA: Diagnosis not present

## 2020-11-20 DIAGNOSIS — I739 Peripheral vascular disease, unspecified: Secondary | ICD-10-CM | POA: Diagnosis not present

## 2020-11-20 DIAGNOSIS — I110 Hypertensive heart disease with heart failure: Secondary | ICD-10-CM | POA: Diagnosis not present

## 2020-11-20 DIAGNOSIS — D6869 Other thrombophilia: Secondary | ICD-10-CM | POA: Diagnosis not present

## 2020-11-20 DIAGNOSIS — R059 Cough, unspecified: Secondary | ICD-10-CM | POA: Diagnosis not present

## 2020-11-20 DIAGNOSIS — I480 Paroxysmal atrial fibrillation: Secondary | ICD-10-CM | POA: Diagnosis not present

## 2020-11-20 DIAGNOSIS — Z89611 Acquired absence of right leg above knee: Secondary | ICD-10-CM | POA: Diagnosis not present

## 2020-11-20 DIAGNOSIS — G4733 Obstructive sleep apnea (adult) (pediatric): Secondary | ICD-10-CM | POA: Diagnosis not present

## 2020-11-20 DIAGNOSIS — E261 Secondary hyperaldosteronism: Secondary | ICD-10-CM | POA: Diagnosis not present

## 2020-11-20 DIAGNOSIS — I509 Heart failure, unspecified: Secondary | ICD-10-CM | POA: Diagnosis not present

## 2020-11-23 DIAGNOSIS — D510 Vitamin B12 deficiency anemia due to intrinsic factor deficiency: Secondary | ICD-10-CM | POA: Diagnosis not present

## 2020-11-23 DIAGNOSIS — E785 Hyperlipidemia, unspecified: Secondary | ICD-10-CM | POA: Diagnosis not present

## 2020-11-23 DIAGNOSIS — F329 Major depressive disorder, single episode, unspecified: Secondary | ICD-10-CM | POA: Diagnosis not present

## 2020-11-23 DIAGNOSIS — I10 Essential (primary) hypertension: Secondary | ICD-10-CM | POA: Diagnosis not present

## 2020-12-24 DIAGNOSIS — F324 Major depressive disorder, single episode, in partial remission: Secondary | ICD-10-CM | POA: Diagnosis not present

## 2020-12-24 DIAGNOSIS — I10 Essential (primary) hypertension: Secondary | ICD-10-CM | POA: Diagnosis not present

## 2020-12-24 DIAGNOSIS — I2510 Atherosclerotic heart disease of native coronary artery without angina pectoris: Secondary | ICD-10-CM | POA: Diagnosis not present

## 2021-03-08 DIAGNOSIS — Z89611 Acquired absence of right leg above knee: Secondary | ICD-10-CM | POA: Diagnosis not present

## 2021-03-08 DIAGNOSIS — Z89612 Acquired absence of left leg above knee: Secondary | ICD-10-CM | POA: Diagnosis not present

## 2021-04-08 DIAGNOSIS — Z961 Presence of intraocular lens: Secondary | ICD-10-CM | POA: Diagnosis not present

## 2021-04-23 DIAGNOSIS — E785 Hyperlipidemia, unspecified: Secondary | ICD-10-CM | POA: Diagnosis not present

## 2021-04-23 DIAGNOSIS — I10 Essential (primary) hypertension: Secondary | ICD-10-CM | POA: Diagnosis not present

## 2021-04-23 DIAGNOSIS — F324 Major depressive disorder, single episode, in partial remission: Secondary | ICD-10-CM | POA: Diagnosis not present

## 2021-05-08 ENCOUNTER — Ambulatory Visit (INDEPENDENT_AMBULATORY_CARE_PROVIDER_SITE_OTHER): Payer: PPO

## 2021-05-08 ENCOUNTER — Other Ambulatory Visit: Payer: Self-pay

## 2021-05-08 DIAGNOSIS — R0989 Other specified symptoms and signs involving the circulatory and respiratory systems: Secondary | ICD-10-CM | POA: Diagnosis not present

## 2021-05-08 DIAGNOSIS — I2510 Atherosclerotic heart disease of native coronary artery without angina pectoris: Secondary | ICD-10-CM

## 2021-05-21 DIAGNOSIS — G4733 Obstructive sleep apnea (adult) (pediatric): Secondary | ICD-10-CM | POA: Diagnosis not present

## 2021-05-21 DIAGNOSIS — I110 Hypertensive heart disease with heart failure: Secondary | ICD-10-CM | POA: Diagnosis not present

## 2021-05-21 DIAGNOSIS — Z89611 Acquired absence of right leg above knee: Secondary | ICD-10-CM | POA: Diagnosis not present

## 2021-05-21 DIAGNOSIS — I509 Heart failure, unspecified: Secondary | ICD-10-CM | POA: Diagnosis not present

## 2021-05-21 DIAGNOSIS — Z89612 Acquired absence of left leg above knee: Secondary | ICD-10-CM | POA: Diagnosis not present

## 2021-05-21 DIAGNOSIS — J449 Chronic obstructive pulmonary disease, unspecified: Secondary | ICD-10-CM | POA: Diagnosis not present

## 2021-05-21 DIAGNOSIS — I480 Paroxysmal atrial fibrillation: Secondary | ICD-10-CM | POA: Diagnosis not present

## 2021-05-21 DIAGNOSIS — D6869 Other thrombophilia: Secondary | ICD-10-CM | POA: Diagnosis not present

## 2021-07-02 DIAGNOSIS — S0990XA Unspecified injury of head, initial encounter: Secondary | ICD-10-CM | POA: Diagnosis not present

## 2021-07-02 DIAGNOSIS — R531 Weakness: Secondary | ICD-10-CM | POA: Diagnosis not present

## 2021-07-02 DIAGNOSIS — R5381 Other malaise: Secondary | ICD-10-CM | POA: Diagnosis not present

## 2021-07-02 DIAGNOSIS — F0781 Postconcussional syndrome: Secondary | ICD-10-CM | POA: Diagnosis not present

## 2021-07-02 DIAGNOSIS — R110 Nausea: Secondary | ICD-10-CM | POA: Diagnosis not present

## 2021-07-24 DIAGNOSIS — F32A Depression, unspecified: Secondary | ICD-10-CM | POA: Diagnosis not present

## 2021-07-24 DIAGNOSIS — I2510 Atherosclerotic heart disease of native coronary artery without angina pectoris: Secondary | ICD-10-CM | POA: Diagnosis not present

## 2021-07-24 DIAGNOSIS — I10 Essential (primary) hypertension: Secondary | ICD-10-CM | POA: Diagnosis not present

## 2021-08-20 DIAGNOSIS — D6869 Other thrombophilia: Secondary | ICD-10-CM | POA: Diagnosis not present

## 2021-08-20 DIAGNOSIS — I739 Peripheral vascular disease, unspecified: Secondary | ICD-10-CM | POA: Diagnosis not present

## 2021-08-20 DIAGNOSIS — E261 Secondary hyperaldosteronism: Secondary | ICD-10-CM | POA: Diagnosis not present

## 2021-08-20 DIAGNOSIS — I480 Paroxysmal atrial fibrillation: Secondary | ICD-10-CM | POA: Diagnosis not present

## 2021-08-20 DIAGNOSIS — I509 Heart failure, unspecified: Secondary | ICD-10-CM | POA: Diagnosis not present

## 2021-08-20 DIAGNOSIS — Z89611 Acquired absence of right leg above knee: Secondary | ICD-10-CM | POA: Diagnosis not present

## 2021-08-20 DIAGNOSIS — F3342 Major depressive disorder, recurrent, in full remission: Secondary | ICD-10-CM | POA: Diagnosis not present

## 2021-08-20 DIAGNOSIS — Z89612 Acquired absence of left leg above knee: Secondary | ICD-10-CM | POA: Diagnosis not present

## 2021-08-20 DIAGNOSIS — J449 Chronic obstructive pulmonary disease, unspecified: Secondary | ICD-10-CM | POA: Diagnosis not present

## 2021-09-26 ENCOUNTER — Ambulatory Visit: Payer: PPO | Admitting: Cardiology

## 2021-09-26 NOTE — Progress Notes (Deleted)
Cardiology Office Note:    Date:  09/26/2021   ID:  [name] [name], DOB [birth year], MRN [id number]  PCP:  Marylen Ponto, MD  Cardiologist:  Norman Herrlich, MD    Referring MD: Marylen Ponto, MD    ASSESSMENT:    No diagnosis found. PLAN:    In order of problems listed above:  ***   Next appointment: ***   Medication Adjustments/Labs and Tests Ordered: Current medicines are reviewed at length with the patient today.  Concerns regarding medicines are outlined above.  No orders of the defined types were placed in this encounter.  No orders of the defined types were placed in this encounter.   No chief complaint on file.   History of Present Illness:    [name] [name] is a 81 y.o. male with a hx of  PAD type II non-ST elevation MI CAD with EF 35 to 40% in 2017 stroke paroxysmal atrial fibrillation anticoagulated heparin-induced thrombocytopenia hyperlipidemia left AKA  last seen 10/16/2020.  Echocardiogram 01/05/2019 shows EF of 60 to 65% with moderate concentric left ventricular hypertrophy normal left ventricular filling pressure moderate left atrial enlargement and no significant valvular abnormality.   From care everywhere coronary angiography 03/26/2016 showed single-vessel CAD chronic total occlusion of the right coronary artery with was felt to be adequate left to right collaterals 60% stenosis mid left circumflex discrete and calcific LAD with 20 to 30% stenosis and previous stent patent.  He was felt to be best treated with ongoing medical therapy.  He was noted to have severe vascular calcification on fluoroscopy and had no finding of LV outflow tract obstruction or aortic stenosis   Echocardiogram 05/11/2020 showed normal left ventricular size moderate concentric LVH normal systolic function EF 60 to 65% and grade 1 diastolic dysfunction.  Right ventricle is normal in size function and normal pulmonary artery pressure left atrium is moderately dilated the right  atrium mildly dilated.  Mild aortic stenosis was seen with a mean gradient of 13 mmHg.  His thoracic aorta was normal.   Cerebrovascular duplex arterial 05/11/2020 showed diffuse atherosclerosis 1 to 39% right internal carotid artery stenosis and 60 to 79% left external carotid artery stenosis with greater than 50% ECA stenosis on that side. Compliance with diet, lifestyle and medications: *** Past Medical History:  Diagnosis Date   Acute systolic congestive heart failure (HCC) 02/01/2016   Anemia    Anxiety    Arthritis    Atrial fibrillation (HCC)    Bell's palsy    Blood transfusion without reported diagnosis    CAD in native artery 09/19/2014   Overview:  1.s/p nonQwave MI 2001, PTCA and stent of LAD and subsequently repeat PTCA and stent of 2 lesions in LAD 08-19-00   2. MPS in March 2011 wo ischemia, EF 44%   3. Lexiscan MPS 07/07/11 wo ischemia, normal EF%   Cardiomyopathy (HCC) 03/26/2016   Carotid artery disease (HCC) 04/29/2016   Overview:  S/P right CEA   Carotid artery occlusion    CHF (congestive heart failure) (HCC)    COPD (chronic obstructive pulmonary disease) (HCC)    Coronary artery disease    Demand ischemia of myocardium (HCC) 01/15/2015   Depression    Essential hypertension 09/19/2014   High cholesterol 04/24/2017   History of atrial fibrillation 04/29/2016   HIT (heparin-induced thrombocytopenia) (HCC) 04/29/2016   Hyperlipidemia    Hypertension    Impaired mobility and activities of daily living 05/12/2016   Intermittent claudication (HCC)  02/24/2012   Ischemic cardiomyopathy 02/01/2016   Overview:  Added automatically from request for surgery [id number]   Lumbar spondylosis 04/24/2017   Myocardial infarction [facility])    X's 2   Neuropathy 04/24/2017   Pain in limb 02/24/2012   PAOD (peripheral arterial occlusive disease) (HCC) 01/22/2016   Peripheral vascular disease (HCC)    Peripheral vascular disease, unspecified (HCC) 02/24/2012   S/P AKA (above knee amputation)  unilateral, left (HCC) 05/12/2016   Seizures (HCC)    Sleep apnea    Stroke [facility])     Past Surgical History:  Procedure Laterality Date    stimulator  Aug. 6, 2012   Implantation of Spinal Stimulator   ANGIOPLASTY  Dec. 2001   with stent   APPENDECTOMY     CAROTID ENDARTERECTOMY  Aug. 2002   RIGHT  cea   Catherization  June 2002   Cardiac   CHOLECYSTECTOMY     Gall Bladder- llaproscopic   COLON SURGERY  Jan. 2009   Ischemic   EYE SURGERY  1949   FEMORAL BYPASS     FINGER AMPUTATION  1960   Right  thumb   FRACTURE SURGERY     LEG AMPUTATION ABOVE KNEE Right    ROTATOR CUFF REPAIR  2007   Right  shoulder   SPINAL FUSION  Sept. 30, 2012   SPINE SURGERY  Feb. 2001   THROMBOENDARTERECTOMY      Current Medications: No outpatient medications have been marked as taking for the 09/26/21 encounter (Appointment) with Baldo Daub, MD.     Allergies:   Codeine, Heparin, Percocet [oxycodone-acetaminophen], Promethazine hcl, Promethazine hcl, Buprenorphine hcl, Morphine and related, Amitriptyline, and Lyrica [pregabalin]   Social History   Socioeconomic History   Marital status: Married    Spouse name: Not on file   Number of children: Not on file   Years of education: Not on file   Highest education level: Not on file  Occupational History   Not on file  Tobacco Use   Smoking status: Former    Types: Cigarettes    Start date: 02/24/2001   Smokeless tobacco: Never  Vaping Use   Vaping Use: Never used  Substance and Sexual Activity   Alcohol use: No   Drug use: No   Sexual activity: Not on file  Other Topics Concern   Not on file  Social History Narrative   Not on file   Social Determinants of Health   Financial Resource Strain: Not on file  Food Insecurity: Not on file  Transportation Needs: Not on file  Physical Activity: Not on file  Stress: Not on file  Social Connections: Not on file     Family History: The patient's ***family history includes Cancer  in his daughter and sister; Deep vein thrombosis in his son; Heart attack in his father and son; Heart disease in his father and son; Hyperlipidemia in his brother, father, and son; Hypertension in his father, mother, sister, and son. ROS:   Please see the history of present illness.    All other systems reviewed and are negative.  EKGs/Labs/Other Studies Reviewed:    The following studies were reviewed today:  EKG:  EKG ordered today and personally reviewed.  The ekg ordered today demonstrates ***  Recent Labs: 10/16/2020: ALT 18; BUN 25; Creatinine, Ser 1.11; Potassium 4.0; Sodium 139  Recent Lipid Panel    Component Value Date/Time   CHOL 157 10/16/2020 1355   TRIG 225 (H) 10/16/2020 1355  HDL 29 (L) 10/16/2020 1355   CHOLHDL 5.4 (H) 10/16/2020 1355   LDLCALC 90 10/16/2020 1355    Physical Exam:    VS:  There were no vitals taken for this visit.    Wt Readings from Last 3 Encounters:  05/27/16 143 lb (64.9 kg)  02/26/16 164 lb (74.4 kg)  02/21/15 164 lb (74.4 kg)     GEN: *** Well nourished, well developed in no acute distress HEENT: Normal NECK: No JVD; No carotid bruits LYMPHATICS: No lymphadenopathy CARDIAC: ***RRR, no murmurs, rubs, gallops RESPIRATORY:  Clear to auscultation without rales, wheezing or rhonchi  ABDOMEN: Soft, non-tender, non-distended MUSCULOSKELETAL:  No edema; No deformity  SKIN: Warm and dry NEUROLOGIC:  Alert and oriented x 3 PSYCHIATRIC:  Normal affect    Signed, Norman Herrlich, MD  09/26/2021 12:27 PM    [facility]

## 2021-10-24 DIAGNOSIS — I2510 Atherosclerotic heart disease of native coronary artery without angina pectoris: Secondary | ICD-10-CM | POA: Diagnosis not present

## 2021-10-24 DIAGNOSIS — F32A Depression, unspecified: Secondary | ICD-10-CM | POA: Diagnosis not present

## 2021-10-24 DIAGNOSIS — I10 Essential (primary) hypertension: Secondary | ICD-10-CM | POA: Diagnosis not present

## 2021-11-07 DIAGNOSIS — S78112A Complete traumatic amputation at level between left hip and knee, initial encounter: Secondary | ICD-10-CM | POA: Diagnosis not present

## 2021-11-07 DIAGNOSIS — Z0000 Encounter for general adult medical examination without abnormal findings: Secondary | ICD-10-CM | POA: Diagnosis not present

## 2021-11-07 DIAGNOSIS — Z6826 Body mass index (BMI) 26.0-26.9, adult: Secondary | ICD-10-CM | POA: Diagnosis not present

## 2021-11-07 DIAGNOSIS — Z1331 Encounter for screening for depression: Secondary | ICD-10-CM | POA: Diagnosis not present

## 2021-11-07 DIAGNOSIS — D6869 Other thrombophilia: Secondary | ICD-10-CM | POA: Diagnosis not present

## 2021-11-07 DIAGNOSIS — E785 Hyperlipidemia, unspecified: Secondary | ICD-10-CM | POA: Diagnosis not present

## 2021-11-07 DIAGNOSIS — I4891 Unspecified atrial fibrillation: Secondary | ICD-10-CM | POA: Diagnosis not present

## 2021-11-07 DIAGNOSIS — S78111A Complete traumatic amputation at level between right hip and knee, initial encounter: Secondary | ICD-10-CM | POA: Diagnosis not present

## 2021-11-19 DIAGNOSIS — E261 Secondary hyperaldosteronism: Secondary | ICD-10-CM | POA: Diagnosis not present

## 2021-11-19 DIAGNOSIS — Z89611 Acquired absence of right leg above knee: Secondary | ICD-10-CM | POA: Diagnosis not present

## 2021-11-19 DIAGNOSIS — D692 Other nonthrombocytopenic purpura: Secondary | ICD-10-CM | POA: Diagnosis not present

## 2021-11-19 DIAGNOSIS — Z89612 Acquired absence of left leg above knee: Secondary | ICD-10-CM | POA: Diagnosis not present

## 2021-11-19 DIAGNOSIS — Z993 Dependence on wheelchair: Secondary | ICD-10-CM | POA: Diagnosis not present

## 2021-11-19 DIAGNOSIS — I480 Paroxysmal atrial fibrillation: Secondary | ICD-10-CM | POA: Diagnosis not present

## 2021-11-19 DIAGNOSIS — Z9181 History of falling: Secondary | ICD-10-CM | POA: Diagnosis not present

## 2021-11-19 DIAGNOSIS — Z7901 Long term (current) use of anticoagulants: Secondary | ICD-10-CM | POA: Diagnosis not present

## 2021-11-19 DIAGNOSIS — D6869 Other thrombophilia: Secondary | ICD-10-CM | POA: Diagnosis not present

## 2021-11-19 DIAGNOSIS — I509 Heart failure, unspecified: Secondary | ICD-10-CM | POA: Diagnosis not present

## 2022-01-06 ENCOUNTER — Other Ambulatory Visit: Payer: Self-pay | Admitting: Cardiology

## 2022-03-03 DIAGNOSIS — Z89612 Acquired absence of left leg above knee: Secondary | ICD-10-CM | POA: Diagnosis not present

## 2022-03-03 DIAGNOSIS — Z89611 Acquired absence of right leg above knee: Secondary | ICD-10-CM | POA: Diagnosis not present

## 2022-03-06 DIAGNOSIS — J40 Bronchitis, not specified as acute or chronic: Secondary | ICD-10-CM | POA: Diagnosis not present

## 2022-03-06 DIAGNOSIS — M791 Myalgia, unspecified site: Secondary | ICD-10-CM | POA: Diagnosis not present

## 2022-03-06 DIAGNOSIS — J329 Chronic sinusitis, unspecified: Secondary | ICD-10-CM | POA: Diagnosis not present

## 2022-03-06 DIAGNOSIS — S0001XA Abrasion of scalp, initial encounter: Secondary | ICD-10-CM | POA: Diagnosis not present

## 2022-04-07 ENCOUNTER — Other Ambulatory Visit: Payer: Self-pay | Admitting: Cardiology

## 2022-06-12 DIAGNOSIS — Z89611 Acquired absence of right leg above knee: Secondary | ICD-10-CM | POA: Diagnosis not present

## 2022-06-12 DIAGNOSIS — I480 Paroxysmal atrial fibrillation: Secondary | ICD-10-CM | POA: Diagnosis not present

## 2022-06-12 DIAGNOSIS — Z9181 History of falling: Secondary | ICD-10-CM | POA: Diagnosis not present

## 2022-06-12 DIAGNOSIS — D6869 Other thrombophilia: Secondary | ICD-10-CM | POA: Diagnosis not present

## 2022-06-12 DIAGNOSIS — E261 Secondary hyperaldosteronism: Secondary | ICD-10-CM | POA: Diagnosis not present

## 2022-06-12 DIAGNOSIS — F3342 Major depressive disorder, recurrent, in full remission: Secondary | ICD-10-CM | POA: Diagnosis not present

## 2022-06-12 DIAGNOSIS — J449 Chronic obstructive pulmonary disease, unspecified: Secondary | ICD-10-CM | POA: Diagnosis not present

## 2022-06-12 DIAGNOSIS — I509 Heart failure, unspecified: Secondary | ICD-10-CM | POA: Diagnosis not present

## 2022-06-12 DIAGNOSIS — Z89612 Acquired absence of left leg above knee: Secondary | ICD-10-CM | POA: Diagnosis not present

## 2022-06-26 ENCOUNTER — Telehealth: Payer: Self-pay

## 2022-06-26 NOTE — Patient Outreach (Signed)
  Care Coordination   Initial Visit Note   06/26/2022 Name: [name] [name] MRN: [id number] DOB: 08-22-1940  [name] [name] is a 82 y.o. year old male who sees Marylen Ponto, MD for primary care. I spoke with  [name] [name] by phone today.  What matters to the patients health and wellness today?  Placed call to patient to review and offer [facility] care coordination program. Spoke with wife who is DPR.  Wife reports patient is doing well.  [facility] send a nurse monthly or every 3 months.   Denies needs.    SDOH assessments and interventions completed:  No     Care Coordination Interventions:  No, not indicated   Follow up plan: No further intervention required.   Encounter Outcome:  Pt. Refused   Rowe Pavy, RN, BSN, CEN [facility] [phone]

## 2022-07-04 ENCOUNTER — Other Ambulatory Visit: Payer: Self-pay | Admitting: Cardiology

## 2022-07-04 NOTE — Telephone Encounter (Signed)
Lasix 20 mg # 15 tablets only, patient needs appointment Final attempt

## 2022-07-12 DIAGNOSIS — M25511 Pain in right shoulder: Secondary | ICD-10-CM | POA: Diagnosis not present

## 2022-07-14 DIAGNOSIS — M25511 Pain in right shoulder: Secondary | ICD-10-CM | POA: Diagnosis not present

## 2022-07-14 DIAGNOSIS — E119 Type 2 diabetes mellitus without complications: Secondary | ICD-10-CM | POA: Diagnosis not present

## 2022-07-14 DIAGNOSIS — I252 Old myocardial infarction: Secondary | ICD-10-CM | POA: Diagnosis not present

## 2022-07-14 DIAGNOSIS — J449 Chronic obstructive pulmonary disease, unspecified: Secondary | ICD-10-CM | POA: Diagnosis not present

## 2022-07-17 DIAGNOSIS — S46001A Unspecified injury of muscle(s) and tendon(s) of the rotator cuff of right shoulder, initial encounter: Secondary | ICD-10-CM

## 2022-07-17 DIAGNOSIS — M25511 Pain in right shoulder: Secondary | ICD-10-CM | POA: Diagnosis not present

## 2022-07-17 HISTORY — DX: Unspecified injury of muscle(s) and tendon(s) of the rotator cuff of right shoulder, initial encounter: S46.001A

## 2022-07-31 DIAGNOSIS — M25511 Pain in right shoulder: Secondary | ICD-10-CM | POA: Diagnosis not present

## 2022-08-07 DIAGNOSIS — S46001A Unspecified injury of muscle(s) and tendon(s) of the rotator cuff of right shoulder, initial encounter: Secondary | ICD-10-CM | POA: Diagnosis not present

## 2022-08-13 DIAGNOSIS — M25511 Pain in right shoulder: Secondary | ICD-10-CM | POA: Diagnosis not present

## 2022-08-13 DIAGNOSIS — M6281 Muscle weakness (generalized): Secondary | ICD-10-CM | POA: Diagnosis not present

## 2022-08-15 DIAGNOSIS — M25511 Pain in right shoulder: Secondary | ICD-10-CM | POA: Diagnosis not present

## 2022-08-15 DIAGNOSIS — M6281 Muscle weakness (generalized): Secondary | ICD-10-CM | POA: Diagnosis not present

## 2022-08-18 DIAGNOSIS — M25511 Pain in right shoulder: Secondary | ICD-10-CM | POA: Diagnosis not present

## 2022-08-18 DIAGNOSIS — H04123 Dry eye syndrome of bilateral lacrimal glands: Secondary | ICD-10-CM | POA: Diagnosis not present

## 2022-08-18 DIAGNOSIS — H524 Presbyopia: Secondary | ICD-10-CM | POA: Diagnosis not present

## 2022-08-18 DIAGNOSIS — M6281 Muscle weakness (generalized): Secondary | ICD-10-CM | POA: Diagnosis not present

## 2022-08-22 DIAGNOSIS — M25511 Pain in right shoulder: Secondary | ICD-10-CM | POA: Diagnosis not present

## 2022-08-22 DIAGNOSIS — M6281 Muscle weakness (generalized): Secondary | ICD-10-CM | POA: Diagnosis not present

## 2022-08-25 DIAGNOSIS — M6281 Muscle weakness (generalized): Secondary | ICD-10-CM | POA: Diagnosis not present

## 2022-08-25 DIAGNOSIS — M25511 Pain in right shoulder: Secondary | ICD-10-CM | POA: Diagnosis not present

## 2022-08-27 DIAGNOSIS — M25511 Pain in right shoulder: Secondary | ICD-10-CM | POA: Diagnosis not present

## 2022-08-27 DIAGNOSIS — M6281 Muscle weakness (generalized): Secondary | ICD-10-CM | POA: Diagnosis not present

## 2022-09-02 DIAGNOSIS — M6281 Muscle weakness (generalized): Secondary | ICD-10-CM | POA: Diagnosis not present

## 2022-09-02 DIAGNOSIS — M25511 Pain in right shoulder: Secondary | ICD-10-CM | POA: Diagnosis not present

## 2022-09-05 DIAGNOSIS — M6281 Muscle weakness (generalized): Secondary | ICD-10-CM | POA: Diagnosis not present

## 2022-09-05 DIAGNOSIS — M25511 Pain in right shoulder: Secondary | ICD-10-CM | POA: Diagnosis not present

## 2022-09-08 DIAGNOSIS — I10 Essential (primary) hypertension: Secondary | ICD-10-CM | POA: Diagnosis not present

## 2022-09-08 DIAGNOSIS — M75101 Unspecified rotator cuff tear or rupture of right shoulder, not specified as traumatic: Secondary | ICD-10-CM | POA: Diagnosis not present

## 2022-09-09 DIAGNOSIS — M25511 Pain in right shoulder: Secondary | ICD-10-CM | POA: Diagnosis not present

## 2022-09-09 DIAGNOSIS — M6281 Muscle weakness (generalized): Secondary | ICD-10-CM | POA: Diagnosis not present

## 2022-09-12 DIAGNOSIS — M6281 Muscle weakness (generalized): Secondary | ICD-10-CM | POA: Diagnosis not present

## 2022-09-12 DIAGNOSIS — M25511 Pain in right shoulder: Secondary | ICD-10-CM | POA: Diagnosis not present

## 2022-09-16 DIAGNOSIS — M25511 Pain in right shoulder: Secondary | ICD-10-CM | POA: Diagnosis not present

## 2022-09-16 DIAGNOSIS — M6281 Muscle weakness (generalized): Secondary | ICD-10-CM | POA: Diagnosis not present

## 2022-09-19 DIAGNOSIS — M6281 Muscle weakness (generalized): Secondary | ICD-10-CM | POA: Diagnosis not present

## 2022-09-19 DIAGNOSIS — M25511 Pain in right shoulder: Secondary | ICD-10-CM | POA: Diagnosis not present

## 2022-09-23 DIAGNOSIS — M25511 Pain in right shoulder: Secondary | ICD-10-CM | POA: Diagnosis not present

## 2022-09-23 DIAGNOSIS — M6281 Muscle weakness (generalized): Secondary | ICD-10-CM | POA: Diagnosis not present

## 2022-09-26 DIAGNOSIS — M25511 Pain in right shoulder: Secondary | ICD-10-CM | POA: Diagnosis not present

## 2022-09-26 DIAGNOSIS — M6281 Muscle weakness (generalized): Secondary | ICD-10-CM | POA: Diagnosis not present

## 2022-09-30 DIAGNOSIS — M25511 Pain in right shoulder: Secondary | ICD-10-CM | POA: Diagnosis not present

## 2022-09-30 DIAGNOSIS — M6281 Muscle weakness (generalized): Secondary | ICD-10-CM | POA: Diagnosis not present

## 2022-10-02 DIAGNOSIS — D6869 Other thrombophilia: Secondary | ICD-10-CM | POA: Diagnosis not present

## 2022-10-02 DIAGNOSIS — E785 Hyperlipidemia, unspecified: Secondary | ICD-10-CM | POA: Diagnosis not present

## 2022-10-02 LAB — LAB REPORT - SCANNED: EGFR: 60

## 2022-10-03 DIAGNOSIS — M25511 Pain in right shoulder: Secondary | ICD-10-CM | POA: Diagnosis not present

## 2022-10-03 DIAGNOSIS — M6281 Muscle weakness (generalized): Secondary | ICD-10-CM | POA: Diagnosis not present

## 2022-10-07 DIAGNOSIS — I4891 Unspecified atrial fibrillation: Secondary | ICD-10-CM | POA: Diagnosis not present

## 2022-10-07 DIAGNOSIS — R413 Other amnesia: Secondary | ICD-10-CM | POA: Diagnosis not present

## 2022-10-07 DIAGNOSIS — Z89612 Acquired absence of left leg above knee: Secondary | ICD-10-CM | POA: Diagnosis not present

## 2022-10-07 DIAGNOSIS — M4800 Spinal stenosis, site unspecified: Secondary | ICD-10-CM | POA: Diagnosis not present

## 2022-10-07 DIAGNOSIS — Z89611 Acquired absence of right leg above knee: Secondary | ICD-10-CM | POA: Diagnosis not present

## 2022-10-07 DIAGNOSIS — M25511 Pain in right shoulder: Secondary | ICD-10-CM | POA: Diagnosis not present

## 2022-10-07 DIAGNOSIS — M6281 Muscle weakness (generalized): Secondary | ICD-10-CM | POA: Diagnosis not present

## 2022-10-07 DIAGNOSIS — I2510 Atherosclerotic heart disease of native coronary artery without angina pectoris: Secondary | ICD-10-CM | POA: Diagnosis not present

## 2022-10-10 DIAGNOSIS — M25511 Pain in right shoulder: Secondary | ICD-10-CM | POA: Diagnosis not present

## 2022-10-10 DIAGNOSIS — M6281 Muscle weakness (generalized): Secondary | ICD-10-CM | POA: Diagnosis not present

## 2022-10-13 ENCOUNTER — Other Ambulatory Visit: Payer: Self-pay | Admitting: Cardiology

## 2022-10-14 DIAGNOSIS — M6281 Muscle weakness (generalized): Secondary | ICD-10-CM | POA: Diagnosis not present

## 2022-10-14 DIAGNOSIS — M75120 Complete rotator cuff tear or rupture of unspecified shoulder, not specified as traumatic: Secondary | ICD-10-CM

## 2022-10-14 DIAGNOSIS — M25511 Pain in right shoulder: Secondary | ICD-10-CM | POA: Diagnosis not present

## 2022-10-14 DIAGNOSIS — M75121 Complete rotator cuff tear or rupture of right shoulder, not specified as traumatic: Secondary | ICD-10-CM | POA: Diagnosis not present

## 2022-10-14 HISTORY — DX: Complete rotator cuff tear or rupture of unspecified shoulder, not specified as traumatic: M75.120

## 2022-10-17 DIAGNOSIS — M6281 Muscle weakness (generalized): Secondary | ICD-10-CM | POA: Diagnosis not present

## 2022-10-17 DIAGNOSIS — M25511 Pain in right shoulder: Secondary | ICD-10-CM | POA: Diagnosis not present

## 2022-10-21 ENCOUNTER — Other Ambulatory Visit: Payer: Self-pay | Admitting: Cardiology

## 2022-12-18 DIAGNOSIS — M75121 Complete rotator cuff tear or rupture of right shoulder, not specified as traumatic: Secondary | ICD-10-CM | POA: Diagnosis not present

## 2022-12-18 DIAGNOSIS — M19011 Primary osteoarthritis, right shoulder: Secondary | ICD-10-CM | POA: Diagnosis not present

## 2022-12-21 DIAGNOSIS — M19019 Primary osteoarthritis, unspecified shoulder: Secondary | ICD-10-CM | POA: Insufficient documentation

## 2022-12-21 HISTORY — DX: Primary osteoarthritis, unspecified shoulder: M19.019

## 2022-12-21 NOTE — Progress Notes (Unsigned)
Cardiology Office Note:    Date:  12/22/2022   ID:  [name] [name], DOB Aug 30, 1940, MRN [id number]  PCP:  [name] Ponto, MD  Cardiologist:  [name] Herrlich, MD    Referring MD: [name] Ponto, MD    ASSESSMENT:    1. Nonrheumatic aortic valve stenosis   2. CAD in native artery   3. Chronic diastolic heart failure (HCC)   4. Hypertensive heart disease with chronic diastolic congestive heart failure (HCC)   5. PAF (paroxysmal atrial fibrillation) (HCC)   6. Chronic anticoagulation   7. Hyperlipidemia, unspecified hyperlipidemia type    PLAN:    In order of problems listed above:  2 years remote from his last echocardiogram recheck for progression of his aortic stenosis especially with his EKG ST-T abnormality. Stable CAD he is having no anginal discomfort he will continue his current anticoagulant along with lipid-lowering with Cipro and rosuvastatin. Well compensated no edema continue his current loop diuretic and ACE inhibitor. Continue lipid-lowering therapy combined based upon rosuvastatin   Next appointment: 6 months   Medication Adjustments/Labs and Tests Ordered: Current medicines are reviewed at length with the patient today.  Concerns regarding medicines are outlined above.  Orders Placed This Encounter  Procedures   EKG 12-Lead   ECHOCARDIOGRAM COMPLETE   No orders of the defined types were placed in this encounter.    History of Present Illness:    [name] [name] is a 82 y.o. male with a hx of CAD with non-ST segment elevation MI EF 35 to 40% in 2017 stroke paroxysmal atrial fibrillation with chronic anticoagulation history of heparin-induced thrombocytopenia hyperlipidemia and peripheral arterial disease.  Last seen by me 10/16/2020.  His echocardiogram 05/11/2020 showed normal left ventricular size moderate LVH grade 1 diastolic dysfunction recovered ejection fraction of 60 to 65% and mild aortic stenosis.  From care everywhere coronary angiography  03/26/2016 showed single-vessel CAD chronic total occlusion of the right coronary artery with was felt to be adequate left to right collaterals 60% stenosis mid left circumflex discrete and calcific LAD with 20 to 30% stenosis and previous stent patent.  He was felt to be best treated with ongoing medical therapy.  He was noted to have severe vascular calcification on fluoroscopy and had no finding of LV outflow tract obstruction or aortic stenosis  Compliance with diet, lifestyle and medications: Yes  Overall is done well no hospitalizations and having no cardiovascular symptoms of chest pain edema shortness of breath palpitation or syncope 2 years remote from his last echocardiogram with mild aortic stenosis Recent labs 10/02/2022 cholesterol 123 LDL 98 triglycerides 122 HDL 37 hemoglobin 11.4 creatinine 1.1 potassium 4.0 Past Medical History:  Diagnosis Date   Acute systolic congestive heart failure (HCC) 02/01/2016   Anemia    Anxiety    Arthritis    Atrial fibrillation (HCC)    Bell's palsy    Blood transfusion without reported diagnosis    CAD in native artery 09/19/2014   Overview:  1.s/p nonQwave MI 2001, PTCA and stent of LAD and subsequently repeat PTCA and stent of 2 lesions in LAD 08-19-00   2. MPS in March 2011 wo ischemia, EF 44%   3. Lexiscan MPS 07/07/11 wo ischemia, normal EF%   Cardiomyopathy (HCC) 03/26/2016   Carotid artery disease (HCC) 04/29/2016   Overview:  S/P right CEA   Carotid artery occlusion    CHF (congestive heart failure) (HCC)    COPD (chronic obstructive pulmonary disease) (HCC)    Coronary  artery disease    Demand ischemia of myocardium (HCC) 01/15/2015   Depression    Essential hypertension 09/19/2014   High cholesterol 04/24/2017   History of atrial fibrillation 04/29/2016   HIT (heparin-induced thrombocytopenia) (HCC) 04/29/2016   Hyperlipidemia    Hypertension    Impaired mobility and activities of daily living 05/12/2016   Intermittent claudication (HCC)  02/24/2012   Ischemic cardiomyopathy 02/01/2016   Overview:  Added automatically from request for surgery [id number]   Lumbar spondylosis 04/24/2017   Myocardial infarction [facility])    X's 2   Neuropathy 04/24/2017   Pain in limb 02/24/2012   PAOD (peripheral arterial occlusive disease) (HCC) 01/22/2016   Peripheral vascular disease (HCC)    Peripheral vascular disease, unspecified (HCC) 02/24/2012   S/P AKA (above knee amputation) unilateral, left (HCC) 05/12/2016   Seizures (HCC)    Sleep apnea    Stroke (HCC)     Current Medications: Current Meds  Medication Sig   acetaminophen (TYLENOL) 325 MG tablet Take 325 mg by mouth every 6 (six) hours as needed for mild pain (pain score 1-3) or moderate pain (pain score 4-6).   carbamazepine (TEGRETOL XR) 100 MG 12 hr tablet Take 100 mg by mouth 2 (two) times daily.   cholecalciferol (VITAMIN D) 1000 UNITS tablet Take 1,000 Units by mouth daily. Reported on 02/21/2015   Cyanocobalamin (VITAMIN B-12) 5000 MCG TBDP Take 1 tablet by mouth daily.   ELIQUIS 5 MG TABS tablet Take 5 mg by mouth 2 (two) times daily.   fenofibrate 160 MG tablet Take 160 mg by mouth daily.   ferrous sulfate 325 (65 FE) MG tablet Take 325 mg by mouth daily with breakfast.   folic acid (FOLVITE) 1 MG tablet Take 1 mg by mouth daily.   furosemide (LASIX) 20 MG tablet Take 1 tablet (20 mg total) by mouth daily. Final attempt, patient needs to keep 12/22/2022 appt for additional refills   gabapentin (NEURONTIN) 300 MG capsule Take 1,200 mg by mouth 3 (three) times daily.    lisinopril (ZESTRIL) 10 MG tablet Take 1 tablet (10 mg total) by mouth daily.   Multiple Vitamin (MULTIVITAMIN) tablet Take 1 tablet by mouth daily.   nitroGLYCERIN (NITROSTAT) 0.4 MG SL tablet Place 1 tablet (0.4 mg total) under the tongue every 5 (five) minutes as needed for chest pain.   rosuvastatin (CRESTOR) 40 MG tablet Take 40 mg by mouth daily.   VASCEPA 1 g CAPS Take 2 g by mouth 2 (two) times daily.       EKGs/Labs/Other Studies Reviewed:    The following studies were reviewed today:  Cardiac Studies & Procedures       ECHOCARDIOGRAM  ECHOCARDIOGRAM COMPLETE 05/11/2020  Narrative ECHOCARDIOGRAM REPORT    Patient Name:   [name] [name] [facility] Date of Exam: 05/11/2020 Medical Rec #:  [id number]         Height:       66.0 in Accession #:    [id number]        Weight:       143.0 lb Date of Birth:  1941-01-04         BSA:          1.734 m Patient Age:    80 years          BP:           156/60 mmHg Patient Gender: M                 HR:  69 bpm. Exam Location:  [place]  Procedure: 2D Echo  Indications:    CAD in native artery [I25.10 (ICD-10-CM)]; Chronic combined systolic and diastolic heart failure (HCC) [I50.42 (ICD-10-CM)]; Hypertensive heart disease with chronic combined systolic and diastolic congestive heart failure (HCC) [I11.0, I50.42 (ICD-10-CM)];  History:        Patient has prior history of Echocardiogram examinations, most recent 01/05/2019. Cardiomyopathy, Stroke and Carotid Disease, Arrythmias:Atrial Fibrillation; Risk Factors:Hypertension, Dyslipidemia and Peripheral vascular disease.  Sonographer:    Louie Boston Referring Phys: [id number] Carrigan Delafuente J Ger Ringenberg  IMPRESSIONS   1. Left ventricular ejection fraction, by estimation, is 60 to 65%. The left ventricle has normal function. The left ventricle has no regional wall motion abnormalities. There is moderate left ventricular hypertrophy. Left ventricular diastolic parameters are consistent with Grade I diastolic dysfunction (impaired relaxation). 2. Right ventricular systolic function is normal. The right ventricular size is normal. There is normal pulmonary artery systolic pressure. 3. Left atrial size was moderately dilated. 4. Right atrial size was mildly dilated. 5. The mitral valve is normal in structure. Mild mitral valve regurgitation. No evidence of mitral stenosis. 6. The aortic valve is normal in  structure. There is moderate calcification of the aortic valve. Aortic valve regurgitation is not visualized. Mild aortic valve stenosis. Aortic valve mean gradient measures 12.5 mmHg. 7. The inferior vena cava is normal in size with greater than 50% respiratory variability, suggesting right atrial pressure of 3 mmHg.  FINDINGS Left Ventricle: Left ventricular ejection fraction, by estimation, is 60 to 65%. The left ventricle has normal function. The left ventricle has no regional wall motion abnormalities. The left ventricular internal cavity size was normal in size. There is moderate left ventricular hypertrophy. Left ventricular diastolic parameters are consistent with Grade I diastolic dysfunction (impaired relaxation).  Right Ventricle: The right ventricular size is normal. No increase in right ventricular wall thickness. Right ventricular systolic function is normal. There is normal pulmonary artery systolic pressure. The tricuspid regurgitant velocity is 2.59 m/s, and with an assumed right atrial pressure of 3 mmHg, the estimated right ventricular systolic pressure is 29.8 mmHg.  Left Atrium: Left atrial size was moderately dilated.  Right Atrium: Right atrial size was mildly dilated.  Pericardium: There is no evidence of pericardial effusion.  Mitral Valve: The mitral valve is normal in structure. Mild to moderate mitral annular calcification. Mild mitral valve regurgitation. No evidence of mitral valve stenosis.  Tricuspid Valve: The tricuspid valve is normal in structure. Tricuspid valve regurgitation is trivial. No evidence of tricuspid stenosis.  Aortic Valve: The aortic valve is normal in structure. There is moderate calcification of the aortic valve. Aortic valve regurgitation is not visualized. Mild aortic stenosis is present. Aortic valve mean gradient measures 12.5 mmHg. Aortic valve peak gradient measures 24.5 mmHg. Aortic valve area, by VTI measures 1.26 cm.  Pulmonic  Valve: The pulmonic valve was normal in structure. Pulmonic valve regurgitation is not visualized. No evidence of pulmonic stenosis.  Aorta: The aortic root is normal in size and structure.  Venous: The inferior vena cava is normal in size with greater than 50% respiratory variability, suggesting right atrial pressure of 3 mmHg.  IAS/Shunts: No atrial level shunt detected by color flow Doppler.   LEFT VENTRICLE PLAX 2D LVIDd:         5.05 cm  Diastology LVIDs:         3.20 cm  LV e' medial:    5.00 cm/s LV PW:  1.65 cm  LV E/e' medial:  26.8 LV IVS:        1.70 cm  LV e' lateral:   6.74 cm/s LVOT diam:     2.00 cm  LV E/e' lateral: 19.9 LV SV:         77 LV SV Index:   44 LVOT Area:     3.14 cm   RIGHT VENTRICLE             IVC RV S prime:     14.80 cm/s  IVC diam: 1.80 cm TAPSE (M-mode): 2.1 cm  LEFT ATRIUM             Index       RIGHT ATRIUM           Index LA diam:        4.30 cm 2.48 cm/m  RA Area:     14.60 cm LA Vol (A2C):   70.7 ml 40.77 ml/m RA Volume:   35.50 ml  20.47 ml/m LA Vol (A4C):   70.7 ml 40.77 ml/m LA Biplane Vol: 75.8 ml 43.71 ml/m AORTIC VALVE AV Area (Vmax):    1.24 cm AV Area (Vmean):   1.20 cm AV Area (VTI):     1.26 cm AV Vmax:           247.50 cm/s AV Vmean:          160.500 cm/s AV VTI:            0.610 m AV Peak Grad:      24.5 mmHg AV Mean Grad:      12.5 mmHg LVOT Vmax:         97.90 cm/s LVOT Vmean:        61.100 cm/s LVOT VTI:          0.244 m LVOT/AV VTI ratio: 0.40  AORTA Ao Root diam: 3.80 cm Ao Asc diam:  3.70 cm Ao Desc diam: 2.00 cm  MITRAL VALVE                TRICUSPID VALVE MV Area (PHT): 1.93 cm     TR Peak grad:   26.8 mmHg MV Decel Time: 394 msec     TR Vmax:        259.00 cm/s MV E velocity: 134.00 cm/s MV A velocity: 127.00 cm/s  SHUNTS MV E/A ratio:  1.06         Systemic VTI:  0.24 m Systemic Diam: 2.00 cm  Gypsy Balsam MD Electronically signed by Gypsy Balsam MD Signature Date/Time:  05/13/2020/9:05:49 PM    Final                 Recent Labs: No results found for requested labs within last 365 days.  Recent Lipid Panel    Component Value Date/Time   CHOL 157 10/16/2020 1355   TRIG 225 (H) 10/16/2020 1355   HDL 29 (L) 10/16/2020 1355   CHOLHDL 5.4 (H) 10/16/2020 1355   LDLCALC 90 10/16/2020 1355        Physical Exam:    VS:  BP (!) 170/68 (BP Location: Left Arm, Patient Position: Sitting)   Pulse 67   Ht 4' (1.219 m)   Wt 135 lb (61.2 kg)   SpO2 95%   BMI 41.20 kg/m     Wt Readings from Last 3 Encounters:  12/22/22 135 lb (61.2 kg)  05/27/16 143 lb (64.9 kg)  02/26/16 164 lb (74.4 kg)     GEN:  Bilateral AKA well nourished, well developed in no acute distress HEENT: Normal NECK: No JVD; No carotid bruits LYMPHATICS: No lymphadenopathy CARDIAC: 1/6 to 2/6 midsystolic ejection murmur aortic area RRR, RESPIRATORY:  Clear to auscultation without rales, wheezing or rhonchi  ABDOMEN: Soft, non-tender, non-distended MUSCULOSKELETAL:  No edema; No deformity  SKIN: Warm and dry NEUROLOGIC:  Alert and oriented x 3 PSYCHIATRIC:  Normal affect    Signed, [name] Herrlich, MD  12/22/2022 11:32 AM    [facility]

## 2022-12-22 ENCOUNTER — Encounter: Payer: Self-pay | Admitting: Cardiology

## 2022-12-22 ENCOUNTER — Ambulatory Visit: Payer: PPO | Attending: Cardiology | Admitting: Cardiology

## 2022-12-22 VITALS — BP 170/68 | HR 67 | Ht <= 58 in | Wt 135.0 lb

## 2022-12-22 DIAGNOSIS — I110 Hypertensive heart disease with heart failure: Secondary | ICD-10-CM | POA: Diagnosis not present

## 2022-12-22 DIAGNOSIS — I2510 Atherosclerotic heart disease of native coronary artery without angina pectoris: Secondary | ICD-10-CM | POA: Diagnosis not present

## 2022-12-22 DIAGNOSIS — I480 Paroxysmal atrial fibrillation: Secondary | ICD-10-CM

## 2022-12-22 DIAGNOSIS — E785 Hyperlipidemia, unspecified: Secondary | ICD-10-CM

## 2022-12-22 DIAGNOSIS — I350 Nonrheumatic aortic (valve) stenosis: Secondary | ICD-10-CM

## 2022-12-22 DIAGNOSIS — Z7901 Long term (current) use of anticoagulants: Secondary | ICD-10-CM | POA: Diagnosis not present

## 2022-12-22 DIAGNOSIS — I5032 Chronic diastolic (congestive) heart failure: Secondary | ICD-10-CM

## 2022-12-22 NOTE — Patient Instructions (Signed)
Medication Instructions:  Your physician recommends that you continue on your current medications as directed. Please refer to the Current Medication list given to you today.  *If you need a refill on your cardiac medications before your next appointment, please call your pharmacy*   Lab Work: None If you have labs (blood work) drawn today and your tests are completely normal, you will receive your results only by: MyChart Message (if you have MyChart) OR A paper copy in the mail If you have any lab test that is abnormal or we need to change your treatment, we will call you to review the results.   Testing/Procedures: Your physician has requested that you have an echocardiogram. Echocardiography is a painless test that uses sound waves to create images of your heart. It provides your doctor with information about the size and shape of your heart and how well your heart's chambers and valves are working. This procedure takes approximately one hour. There are no restrictions for this procedure. Please do NOT wear cologne, perfume, aftershave, or lotions (deodorant is allowed). Please arrive 15 minutes prior to your appointment time.    Follow-Up: At [facility], you and your health needs are our priority.  As part of our continuing mission to provide you with exceptional heart care, we have created designated Provider Care Teams.  These Care Teams include your primary Cardiologist (physician) and Advanced Practice Providers (APPs -  Physician Assistants and Nurse Practitioners) who all work together to provide you with the care you need, when you need it.  We recommend signing up for the patient portal called "MyChart".  Sign up information is provided on this After Visit Summary.  MyChart is used to connect with patients for Virtual Visits (Telemedicine).  Patients are able to view lab/test results, encounter notes, upcoming appointments, etc.  Non-urgent messages can be sent to your  provider as well.   To learn more about what you can do with MyChart, go to [email or web address].    Your next appointment:   6 month(s)  Provider:   Brian Munley, MD    Other Instructions None  

## 2022-12-30 ENCOUNTER — Ambulatory Visit: Payer: PPO | Attending: Cardiology

## 2022-12-30 DIAGNOSIS — Z7901 Long term (current) use of anticoagulants: Secondary | ICD-10-CM | POA: Diagnosis not present

## 2022-12-30 DIAGNOSIS — I480 Paroxysmal atrial fibrillation: Secondary | ICD-10-CM

## 2022-12-30 DIAGNOSIS — I5032 Chronic diastolic (congestive) heart failure: Secondary | ICD-10-CM

## 2022-12-30 DIAGNOSIS — E785 Hyperlipidemia, unspecified: Secondary | ICD-10-CM

## 2022-12-30 DIAGNOSIS — I110 Hypertensive heart disease with heart failure: Secondary | ICD-10-CM

## 2022-12-30 DIAGNOSIS — I2510 Atherosclerotic heart disease of native coronary artery without angina pectoris: Secondary | ICD-10-CM

## 2022-12-30 LAB — ECHOCARDIOGRAM COMPLETE
AR max vel: 1.05 cm2
AV Area VTI: 1.1 cm2
AV Area mean vel: 1.01 cm2
AV Mean grad: 13 mm[Hg]
AV Peak grad: 24.5 mm[Hg]
Ao pk vel: 2.47 m/s
Area-P 1/2: 3.04 cm2
MV VTI: 1.52 cm2
S' Lateral: 4.4 cm

## 2023-01-07 DIAGNOSIS — I739 Peripheral vascular disease, unspecified: Secondary | ICD-10-CM | POA: Diagnosis not present

## 2023-01-07 DIAGNOSIS — Z89612 Acquired absence of left leg above knee: Secondary | ICD-10-CM | POA: Diagnosis not present

## 2023-01-07 DIAGNOSIS — M75101 Unspecified rotator cuff tear or rupture of right shoulder, not specified as traumatic: Secondary | ICD-10-CM | POA: Diagnosis not present

## 2023-01-07 DIAGNOSIS — I509 Heart failure, unspecified: Secondary | ICD-10-CM | POA: Diagnosis not present

## 2023-01-07 DIAGNOSIS — Z89611 Acquired absence of right leg above knee: Secondary | ICD-10-CM | POA: Diagnosis not present

## 2023-01-07 DIAGNOSIS — I110 Hypertensive heart disease with heart failure: Secondary | ICD-10-CM | POA: Diagnosis not present

## 2023-01-07 DIAGNOSIS — I480 Paroxysmal atrial fibrillation: Secondary | ICD-10-CM | POA: Diagnosis not present

## 2023-01-07 DIAGNOSIS — Z7901 Long term (current) use of anticoagulants: Secondary | ICD-10-CM | POA: Diagnosis not present

## 2023-01-07 DIAGNOSIS — D6869 Other thrombophilia: Secondary | ICD-10-CM | POA: Diagnosis not present

## 2023-01-29 DIAGNOSIS — G546 Phantom limb syndrome with pain: Secondary | ICD-10-CM | POA: Diagnosis not present

## 2023-01-29 DIAGNOSIS — M6281 Muscle weakness (generalized): Secondary | ICD-10-CM | POA: Diagnosis not present

## 2023-02-24 ENCOUNTER — Other Ambulatory Visit: Payer: Self-pay | Admitting: Cardiology

## 2023-03-04 ENCOUNTER — Telehealth: Payer: Self-pay

## 2023-03-04 NOTE — Telephone Encounter (Signed)
 Please advise holding Eliquis prior to right reverse shoulder arthroplasty.   Thank you!  DW

## 2023-03-04 NOTE — Telephone Encounter (Signed)
   Pre-operative Risk Assessment    Patient Name: [name] [name]  DOB: 12/21/1940 MRN: [id number]   Date of last office visit: 12/22/22 Date of next office visit: Not scheduled   Request for Surgical Clearance    Procedure:   Right reverse shoulder arthroplasty  Date of Surgery:  Clearance TBD                                Surgeon:  Dr. Elspeth Her Surgeon's Group or Practice Name:  [facility] Phone number:  [phone] Fax number:  [phone]   Type of Clearance Requested:   - Medical    Type of Anesthesia:  General    Additional requests/questions:    Bonney Ival LOISE Gerome   03/04/2023, 2:46 PM

## 2023-03-07 NOTE — Telephone Encounter (Signed)
 Patient with diagnosis of atrial fibrillation on Eliquis for anticoagulation.    Procedure:   Right reverse shoulder arthroplasty   Date of Surgery:  Clearance TBD   CHA2DS2-VASc Score = 7   This indicates a 11.2% annual risk of stroke. The patient's score is based upon: CHF History: 1 HTN History: 1 Diabetes History: 0 Stroke History: 2 Vascular Disease History: 1 Age Score: 2 Gender Score: 0   Per chart CVA in 2002  CrCl 45 Platelet count: last drawn in 2022, please draw CBC for final clearance    **This guidance is not considered finalized until pre-operative APP has relayed final recommendations.**

## 2023-03-09 ENCOUNTER — Telehealth: Payer: Self-pay | Admitting: *Deleted

## 2023-03-09 DIAGNOSIS — Z7901 Long term (current) use of anticoagulants: Secondary | ICD-10-CM

## 2023-03-09 DIAGNOSIS — I480 Paroxysmal atrial fibrillation: Secondary | ICD-10-CM

## 2023-03-09 NOTE — Telephone Encounter (Signed)
 I s/w the pt's wife as she states the pt is HOH. Pt has been scheduled tele preop appt 03/18/23. Med rec and consent are done. Pt will have lab work done this week per pt's wife. I will place the order for the CBC to be done at [facility].

## 2023-03-09 NOTE — Telephone Encounter (Signed)
   Name: [name] [name]  DOB: [birth year]  MRN: [id number]  Primary Cardiologist: None  Chart reviewed as part of pre-operative protocol coverage. Because of [name]'s past medical history and time since last visit, he will require a follow-up telephone visit in order to better assess preoperative cardiovascular risk.  Pre-op covering staff: - Please schedule appointment and call patient to inform them. If patient already had an upcoming appointment within acceptable timeframe, please add pre-op clearance to the appointment notes so provider is aware. - Please contact requesting surgeon's office via preferred method (i.e, phone, fax) to inform them of need for appointment prior to surgery.  She will need an updated CBC before pharmacy can weigh in.    [name], PA-C  03/09/2023, 8:40 AM

## 2023-03-09 NOTE — Telephone Encounter (Signed)
 I s/w the pt's wife as she states the pt is HOH. Pt has been scheduled tele preop appt 03/18/23. Med rec and consent are done. Pt will have lab work done this week per pt's wife. I will place the order for the CBC to be done at [facility].     Patient Consent for Virtual Visit        [name] has provided verbal consent on 03/09/2023 for a virtual visit (video or telephone).   CONSENT FOR VIRTUAL VISIT FOR:  [name] [name]  By participating in this virtual visit I agree to the following:  I hereby voluntarily request, consent and authorize [facility] and its employed or contracted physicians, physician assistants, nurse practitioners or other licensed health care professionals (the Practitioner), to provide me with telemedicine health care services (the "Services) as deemed necessary by the treating Practitioner. I acknowledge and consent to receive the Services by the Practitioner via telemedicine. I understand that the telemedicine visit will involve communicating with the Practitioner through live audiovisual communication technology and the disclosure of certain medical information by electronic transmission. I acknowledge that I have been given the opportunity to request an in-person assessment or other available alternative prior to the telemedicine visit and am voluntarily participating in the telemedicine visit.  I understand that I have the right to withhold or withdraw my consent to the use of telemedicine in the course of my care at any time, without affecting my right to future care or treatment, and that the Practitioner or I may terminate the telemedicine visit at any time. I understand that I have the right to inspect all information obtained and/or recorded in the course of the telemedicine visit and may receive copies of available information for a reasonable fee.  I understand that some of the potential risks of receiving the Services via telemedicine include:   Delay or interruption in medical evaluation due to technological equipment failure or disruption; Information transmitted may not be sufficient (e.g. poor resolution of images) to allow for appropriate medical decision making by the Practitioner; and/or  In rare instances, security protocols could fail, causing a breach of personal health information.  Furthermore, I acknowledge that it is my responsibility to provide information about my medical history, conditions and care that is complete and accurate to the best of my ability. I acknowledge that Practitioner's advice, recommendations, and/or decision may be based on factors not within their control, such as incomplete or inaccurate data provided by me or distortions of diagnostic images or specimens that may result from electronic transmissions. I understand that the practice of medicine is not an exact science and that Practitioner makes no warranties or guarantees regarding treatment outcomes. I acknowledge that a copy of this consent can be made available to me via my patient portal [facility] MyChart), or I can request a printed copy by calling the office of [facility].    I understand that my insurance will be billed for this visit.   I have read or had this consent read to me. I understand the contents of this consent, which adequately explains the benefits and risks of the Services being provided via telemedicine.  I have been provided ample opportunity to ask questions regarding this consent and the Services and have had my questions answered to my satisfaction. I give my informed consent for the services to be provided through the use of telemedicine in my medical care

## 2023-03-18 ENCOUNTER — Ambulatory Visit (INDEPENDENT_AMBULATORY_CARE_PROVIDER_SITE_OTHER): Payer: PPO

## 2023-03-18 DIAGNOSIS — Z01810 Encounter for preprocedural cardiovascular examination: Secondary | ICD-10-CM

## 2023-03-18 NOTE — Progress Notes (Signed)
Virtual Visit via Telephone Note   Because of Pedram Castor Tribbey's co-morbid illnesses, he is at least at moderate risk for complications without adequate follow up.  This format is felt to be most appropriate for this patient at this time.  The patient did not have access to video technology/had technical difficulties with video requiring transitioning to audio format only (telephone).  All issues noted in this document were discussed and addressed.  No physical exam could be performed with this format.  Please refer to the patient's chart for his consent to telehealth for [facility].  Evaluation Performed:  Preoperative cardiovascular risk assessment _____________   Date:  03/18/2023   Patient ID:  [name] [name], DOB 1940/06/16, MRN [id number] Patient Location:  Home Provider location:   Office  Primary Care Provider:  Marylen Ponto, MD Primary Cardiologist:  None  Chief Complaint / Patient Profile   83 y.o. y/o male with a h/o CAD s/p NSTEMI with PTCA and PCI of LAD in 2001, ICM, HTN, HLD, HFrEF, CVA who is pending right reverse shoulder arthroplasty and presents today for telephonic preoperative cardiovascular risk assessment.  History of Present Illness    [name] [name] is a 83 y.o. male who presents via audio/video conferencing for a telehealth visit today.  Pt was last seen in cardiology clinic on 12/22/2022 by Dr. Dulce Sellar.  At that time [name] [name] was doing well .  The patient is now pending procedure as outlined above. Since his last visit, he   -Procedure canceled due to updated labs not being available.  Past Medical History    Past Medical History:  Diagnosis Date   Acute systolic congestive heart failure (HCC) 02/01/2016   Anemia    Anxiety    Arthritis    Atrial fibrillation (HCC)    Bell's palsy    Blood transfusion without reported diagnosis    CAD in native artery 09/19/2014   Overview:  1.s/p nonQwave MI 2001, PTCA and stent of LAD  and subsequently repeat PTCA and stent of 2 lesions in LAD 08-19-00   2. MPS in March 2011 wo ischemia, EF 44%   3. Lexiscan MPS 07/07/11 wo ischemia, normal EF%   Cardiomyopathy (HCC) 03/26/2016   Carotid artery disease (HCC) 04/29/2016   Overview:  S/P right CEA   Carotid artery occlusion    CHF (congestive heart failure) (HCC)    COPD (chronic obstructive pulmonary disease) (HCC)    Coronary artery disease    Demand ischemia of myocardium (HCC) 01/15/2015   Depression    Essential hypertension 09/19/2014   High cholesterol 04/24/2017   History of atrial fibrillation 04/29/2016   HIT (heparin-induced thrombocytopenia) (HCC) 04/29/2016   Hyperlipidemia    Hypertension    Impaired mobility and activities of daily living 05/12/2016   Intermittent claudication (HCC) 02/24/2012   Ischemic cardiomyopathy 02/01/2016   Overview:  Added automatically from request for surgery [id number]   Lumbar spondylosis 04/24/2017   Myocardial infarction (HCC)    X's 2   Neuropathy 04/24/2017   Pain in limb 02/24/2012   PAOD (peripheral arterial occlusive disease) (HCC) 01/22/2016   Peripheral vascular disease (HCC)    Peripheral vascular disease, unspecified (HCC) 02/24/2012   S/P AKA (above knee amputation) unilateral, left (HCC) 05/12/2016   Seizures (HCC)    Sleep apnea    Stroke [facility])    Past Surgical History:  Procedure Laterality Date    stimulator  Aug. 6, 2012   Implantation of Spinal  Stimulator   ANGIOPLASTY  Dec. 2001   with stent   APPENDECTOMY     CAROTID ENDARTERECTOMY  Aug. 2002   RIGHT  cea   Catherization  June 2002   Cardiac   CHOLECYSTECTOMY     Gall Bladder- llaproscopic   COLON SURGERY  Jan. 2009   Ischemic   EYE SURGERY  1949   FEMORAL BYPASS     FINGER AMPUTATION  1960   Right  thumb   FRACTURE SURGERY     LEG AMPUTATION ABOVE KNEE Right    ROTATOR CUFF REPAIR  2007   Right  shoulder   SPINAL FUSION  Sept. 30, 2012   SPINE SURGERY  Feb. 2001   THROMBOENDARTERECTOMY       Allergies  Allergies  Allergen Reactions   Codeine Other (See Comments)    "Makes me crazy"   Heparin Other (See Comments)    HIT 12/17 HIT 12/17 HIPA  Positive, SRA negative, hematology consulted, refer to admission from 01/2016   Percocet [Oxycodone-Acetaminophen] Anaphylaxis   Promethazine Hcl Other (See Comments)    "Makes me crazy"   Promethazine Hcl Other (See Comments)    "Makes me crazy"   Buprenorphine Hcl Rash   Morphine And Codeine Rash   Amitriptyline Other (See Comments) and Nausea Only   Iodinated Contrast Media Other (See Comments)   Lyrica [Pregabalin]     Aggressive behavior    Home Medications    Prior to Admission medications   Medication Sig Start Date End Date Taking? Authorizing Provider  acetaminophen (TYLENOL) 325 MG tablet Take 325 mg by mouth every 6 (six) hours as needed for mild pain (pain score 1-3) or moderate pain (pain score 4-6). 01/27/18   [provider]  carbamazepine (TEGRETOL XR) 100 MG 12 hr tablet Take 100 mg by mouth 2 (two) times daily. 10/07/18   [provider]  cholecalciferol (VITAMIN D) 1000 UNITS tablet Take 1,000 Units by mouth daily. Reported on 02/21/2015    [provider]  Cyanocobalamin (VITAMIN B-12) 5000 MCG TBDP Take 1 tablet by mouth daily.    [provider]  ELIQUIS 5 MG TABS tablet Take 5 mg by mouth 2 (two) times daily. 04/19/17   [provider]  fenofibrate 160 MG tablet Take 160 mg by mouth daily. 10/07/18   [provider]  ferrous sulfate 325 (65 FE) MG tablet Take 325 mg by mouth daily with breakfast. 04/29/16   [provider]  folic acid (FOLVITE) 1 MG tablet Take 1 mg by mouth daily.    [provider]  furosemide (LASIX) 20 MG tablet Take 1 tablet (20 mg total) by mouth daily. 02/26/23   Baldo Daub, MD  gabapentin (NEURONTIN) 300 MG capsule Take 1,200 mg by mouth 3 (three) times daily.     [provider]  lisinopril (ZESTRIL)  10 MG tablet Take 1 tablet (10 mg total) by mouth daily. 10/16/20 12/19/22  Baldo Daub, MD  Multiple Vitamin (MULTIVITAMIN) tablet Take 1 tablet by mouth daily.    [provider]  nitroGLYCERIN (NITROSTAT) 0.4 MG SL tablet Place 1 tablet (0.4 mg total) under the tongue every 5 (five) minutes as needed for chest pain. 04/11/20 12/19/22  Baldo Daub, MD  rosuvastatin (CRESTOR) 40 MG tablet Take 40 mg by mouth daily.    [provider]  VASCEPA 1 g CAPS Take 2 g by mouth 2 (two) times daily. 11/29/18   [provider]  Physical Exam    Vital Signs:  CONRAD ABITZ does not have vital signs available for review today.  Given telephonic nature of communication, physical exam is limited. AAOx3. NAD. Normal affect.  Speech and respirations are unlabored.  Accessory Clinical Findings    None  Assessment & Plan    1.  Preoperative Cardiovascular Risk Assessment: -Patient's RCRI score is 11%  The patient was advised that if he develops new symptoms prior to surgery to contact our office to arrange for a follow-up visit, and he verbalized understanding.  (Reminder: Include SBE prophylaxis/Antiplatelet/Anticoag Instructions)  A copy of this note will be routed to requesting surgeon.  Time:   Today, I have spent  minutes with the patient with telehealth technology discussing medical history, symptoms, and management plan.     Napoleon Form, Leodis Rains, NP  03/18/2023, 8:00 AM

## 2023-03-18 NOTE — Telephone Encounter (Signed)
Left message for the pt to call back. Per preop APP Robin Searing, NP does not look like the pt had the lab work that we have requested needed for preop clearance. Per preop APP need to cancel tele for today and reach out to the pt to remind him he needs the lab work before we can reschedule the tele preop appt.   I have cancelled tele for today. I will send FYI to requesting office as well.

## 2023-03-19 DIAGNOSIS — Z7901 Long term (current) use of anticoagulants: Secondary | ICD-10-CM | POA: Diagnosis not present

## 2023-03-19 DIAGNOSIS — I480 Paroxysmal atrial fibrillation: Secondary | ICD-10-CM | POA: Diagnosis not present

## 2023-03-19 LAB — CBC
Hematocrit: 35.7 % — ABNORMAL LOW (ref 37.5–51.0)
Hemoglobin: 11.8 g/dL — ABNORMAL LOW (ref 13.0–17.7)
MCH: 31.4 pg (ref 26.6–33.0)
MCHC: 33.1 g/dL (ref 31.5–35.7)
MCV: 95 fL (ref 79–97)
Platelets: 172 10*3/uL (ref 150–450)
RBC: 3.76 x10E6/uL — ABNORMAL LOW (ref 4.14–5.80)
RDW: 13.1 % (ref 11.6–15.4)
WBC: 5 10*3/uL (ref 3.4–10.8)

## 2023-03-20 NOTE — Telephone Encounter (Signed)
Labs drawn 1/23 show platelets at 172.  Okay to hold Eliquis for 3 days for procedure.

## 2023-03-25 ENCOUNTER — Telehealth: Payer: Self-pay | Admitting: Cardiology

## 2023-03-25 NOTE — Telephone Encounter (Signed)
See previous clearance encounter. Patient's wife states patient had labs and they would like to reschedule telehealth pre-op appointment.

## 2023-03-26 NOTE — Telephone Encounter (Signed)
   Name: [name] [name]  DOB: 09-Sep-1940  MRN: [id number]  Primary Cardiologist: None   Preoperative team, please contact this patient and set up a phone call appointment for further preoperative risk assessment. Please obtain consent and complete medication review. Thank you for your help.  I confirm that guidance regarding antiplatelet and oral anticoagulation therapy has been completed and, if necessary, noted below.  Labs drawn 1/23 show platelets at 172. Okay to hold Eliquis for 3 days for procedure.   I also confirmed the patient resides in the state of [place]. As per [facility] Medical Board telemedicine laws, the patient must reside in the state in which the provider is licensed.   Ronney Asters, NP 03/26/2023, 4:41 PM [facility]

## 2023-03-27 NOTE — Telephone Encounter (Signed)
S/w the pt's wife and have rescheduled tele preop appt 04/09/23.

## 2023-03-27 NOTE — Telephone Encounter (Signed)
Ronney Asters, NP routed conversation to Cv Div Preop Callback22 hours ago (4:43 PM)   Ronney Asters, NP22 hours ago (4:43 PM)       Name: [name] [name]  DOB: [birth year]  MRN: [id number]   Primary Cardiologist: None     Preoperative team, please contact this patient and set up a phone call appointment for further preoperative risk assessment. Please obtain consent and complete medication review. Thank you for your help.   I confirm that guidance regarding antiplatelet and oral anticoagulation therapy has been completed and, if necessary, noted below.   Labs drawn 1/23 show platelets at 172. Okay to hold Eliquis for 3 days for procedure.    I also confirmed the patient resides in the state of [place]. As per [facility] Medical Board telemedicine laws, the patient must reside in the state in which the provider is licensed.     Ronney Asters, NP 03/26/2023, 4:41 PM  [facility]            Note   Lonia Farber, RN routed conversation to [employer] hours ago (4:30 PM)   Malachi Carl E routed conversation to Cv Div Ash/Hp Triage2 days ago   Malachi Carl E2 days ago   KT See previous clearance encounter. Patient's wife states patient had labs and they would like to reschedule telehealth pre-op appointment.      Note   Wallington,[name] [phone]  Rolly Pancake

## 2023-04-09 ENCOUNTER — Ambulatory Visit: Payer: PPO | Attending: Emergency Medicine | Admitting: Emergency Medicine

## 2023-04-09 DIAGNOSIS — Z01810 Encounter for preprocedural cardiovascular examination: Secondary | ICD-10-CM | POA: Diagnosis not present

## 2023-04-09 NOTE — Progress Notes (Signed)
Virtual Visit via Telephone Note   Because of [name] [name]'s co-morbid illnesses, he is at least at moderate risk for complications without adequate follow up.  This format is felt to be most appropriate for this patient at this time.  The patient did not have access to video technology/had technical difficulties with video requiring transitioning to audio format only (telephone).  All issues noted in this document were discussed and addressed.  No physical exam could be performed with this format.  Please refer to the patient's chart for his consent to telehealth for [facility].  Evaluation Performed:  Preoperative cardiovascular risk assessment _____________   Date:  04/09/2023   Patient ID:  [name] [name], DOB 08-12-1940, MRN [id number] Patient Location:  Home Provider location:   Office  Primary Care Provider:  Marylen Ponto, MD Primary Cardiologist:  None  Chief Complaint / Patient Profile   83 y.o. y/o male with a h/o nonrheumatic aortic valve stenosis, coronary artery disease, chronic diastolic heart failure, CVA, hypertension, paroxysmal atrial fibrillation, chronic anticoagulation, hyperlipidemia who is pending right reverse shoulder arthroplasty with [facility] with date of surgery TBD and presents today for telephonic preoperative cardiovascular risk assessment.  History of Present Illness    [name] [name] is a 83 y.o. male who presents via audio/video conferencing for a telehealth visit today.  Pt was last seen in cardiology clinic on 12/22/2022 by Dr. Dulce Sellar.  At that time ECHO ALLSBROOK was doing well.  The patient is now pending procedure as outlined above. Since his last visit, he denies chest pain, shortness of breath, fatigue, palpitations, melena, hematuria, hemoptysis, diaphoresis, weakness, presyncope, syncope, orthopnea, and PND.  Pt is a double amputee however does stay very active.  He continues to do yard work mostly blowing leaves,  workout at [employer], drive, carry groceries and go shopping, as well as perform light and moderate work around the house such as washing dishes, dusting and sweeping. He denies any exertional angina or dyspnea when active.  Recent echocardiogram 12/2022 normal LVEF  Past Medical History    Past Medical History:  Diagnosis Date   Acute systolic congestive heart failure (HCC) 02/01/2016   Anemia    Anxiety    Arthritis    Atrial fibrillation (HCC)    Bell's palsy    Blood transfusion without reported diagnosis    CAD in native artery 09/19/2014   Overview:  1.s/p nonQwave MI 2001, PTCA and stent of LAD and subsequently repeat PTCA and stent of 2 lesions in LAD 08-19-00   2. MPS in March 2011 wo ischemia, EF 44%   3. Lexiscan MPS 07/07/11 wo ischemia, normal EF%   Cardiomyopathy (HCC) 03/26/2016   Carotid artery disease (HCC) 04/29/2016   Overview:  S/P right CEA   Carotid artery occlusion    CHF (congestive heart failure) (HCC)    COPD (chronic obstructive pulmonary disease) (HCC)    Coronary artery disease    Demand ischemia of myocardium (HCC) 01/15/2015   Depression    Essential hypertension 09/19/2014   High cholesterol 04/24/2017   History of atrial fibrillation 04/29/2016   HIT (heparin-induced thrombocytopenia) (HCC) 04/29/2016   Hyperlipidemia    Hypertension    Impaired mobility and activities of daily living 05/12/2016   Intermittent claudication (HCC) 02/24/2012   Ischemic cardiomyopathy 02/01/2016   Overview:  Added automatically from request for surgery [id number]   Lumbar spondylosis 04/24/2017   Myocardial infarction (HCC)    X's 2  Neuropathy 04/24/2017   Pain in limb 02/24/2012   PAOD (peripheral arterial occlusive disease) (HCC) 01/22/2016   Peripheral vascular disease (HCC)    Peripheral vascular disease, unspecified (HCC) 02/24/2012   S/P AKA (above knee amputation) unilateral, left (HCC) 05/12/2016   Seizures (HCC)    Sleep apnea    Stroke [facility])    Past Surgical History:   Procedure Laterality Date    stimulator  Aug. 6, 2012   Implantation of Spinal Stimulator   ANGIOPLASTY  Dec. 2001   with stent   APPENDECTOMY     CAROTID ENDARTERECTOMY  Aug. 2002   RIGHT  cea   Catherization  June 2002   Cardiac   CHOLECYSTECTOMY     Gall Bladder- llaproscopic   COLON SURGERY  Jan. 2009   Ischemic   EYE SURGERY  1949   FEMORAL BYPASS     FINGER AMPUTATION  1960   Right  thumb   FRACTURE SURGERY     LEG AMPUTATION ABOVE KNEE Right    ROTATOR CUFF REPAIR  2007   Right  shoulder   SPINAL FUSION  Sept. 30, 2012   SPINE SURGERY  Feb. 2001   THROMBOENDARTERECTOMY      Allergies  Allergies  Allergen Reactions   Codeine Other (See Comments)    "Makes me crazy"   Heparin Other (See Comments)    HIT 12/17 HIT 12/17 HIPA  Positive, SRA negative, hematology consulted, refer to admission from 01/2016   Percocet [Oxycodone-Acetaminophen] Anaphylaxis   Promethazine Hcl Other (See Comments)    "Makes me crazy"   Promethazine Hcl Other (See Comments)    "Makes me crazy"   Buprenorphine Hcl Rash   Morphine And Codeine Rash   Amitriptyline Other (See Comments) and Nausea Only   Iodinated Contrast Media Other (See Comments)   Lyrica [Pregabalin]     Aggressive behavior    Home Medications    Prior to Admission medications   Medication Sig Start Date End Date Taking? Authorizing Provider  acetaminophen (TYLENOL) 325 MG tablet Take 325 mg by mouth every 6 (six) hours as needed for mild pain (pain score 1-3) or moderate pain (pain score 4-6). 01/27/18   [provider]  carbamazepine (TEGRETOL XR) 100 MG 12 hr tablet Take 100 mg by mouth 2 (two) times daily. 10/07/18   [provider]  cholecalciferol (VITAMIN D) 1000 UNITS tablet Take 1,000 Units by mouth daily. Reported on 02/21/2015    [provider]  Cyanocobalamin (VITAMIN B-12) 5000 MCG TBDP Take 1 tablet by mouth daily.    [provider]  ELIQUIS 5 MG TABS tablet Take  5 mg by mouth 2 (two) times daily. 04/19/17   [provider]  fenofibrate 160 MG tablet Take 160 mg by mouth daily. 10/07/18   [provider]  ferrous sulfate 325 (65 FE) MG tablet Take 325 mg by mouth daily with breakfast. 04/29/16   [provider]  folic acid (FOLVITE) 1 MG tablet Take 1 mg by mouth daily.    [provider]  furosemide (LASIX) 20 MG tablet Take 1 tablet (20 mg total) by mouth daily. 02/26/23   Baldo Daub, MD  gabapentin (NEURONTIN) 300 MG capsule Take 1,200 mg by mouth 3 (three) times daily.     [provider]  lisinopril (ZESTRIL) 10 MG tablet Take 1 tablet (10 mg total) by mouth daily. 10/16/20 12/19/22  Baldo Daub, MD  Multiple Vitamin (MULTIVITAMIN) tablet Take 1 tablet by  mouth daily.    [provider]  nitroGLYCERIN (NITROSTAT) 0.4 MG SL tablet Place 1 tablet (0.4 mg total) under the tongue every 5 (five) minutes as needed for chest pain. 04/11/20 12/19/22  Baldo Daub, MD  rosuvastatin (CRESTOR) 40 MG tablet Take 40 mg by mouth daily.    [provider]  VASCEPA 1 g CAPS Take 2 g by mouth 2 (two) times daily. 11/29/18   [provider]    Physical Exam    Vital Signs:  POWELL HALBERT does not have vital signs available for review today.  Given telephonic nature of communication, physical exam is limited. AAOx3. NAD. Normal affect.  Speech and respirations are unlabored.  Accessory Clinical Findings    None  Assessment & Plan    1.  Preoperative Cardiovascular Risk Assessment: According to the Revised Cardiac Risk Index (RCRI), his Perioperative Risk of Major Cardiac Event is (%): 11. His Functional Capacity in METs is: 4.4 according to the Duke Activity Status Index (DASI). Therefore, based on ACC/AHA guidelines, patient would be at acceptable risk for the planned procedure without further cardiovascular testing.   The patient was advised that if he develops new symptoms prior  to surgery to contact our office to arrange for a follow-up visit, and he verbalized understanding.  Okay to hold Eliquis for 3 days for procedure. Please resume Eliquis as soon as possible postprocedure, at the discretion of the surgeon.    A copy of this note will be routed to requesting surgeon.  Time:   Today, I have spent 9 minutes with the patient with telehealth technology discussing medical history, symptoms, and management plan.     Denyce Robert, NP  04/09/2023, 2:53 PM

## 2023-04-21 DIAGNOSIS — M4800 Spinal stenosis, site unspecified: Secondary | ICD-10-CM | POA: Diagnosis not present

## 2023-04-21 DIAGNOSIS — M75100 Unspecified rotator cuff tear or rupture of unspecified shoulder, not specified as traumatic: Secondary | ICD-10-CM | POA: Diagnosis not present

## 2023-04-21 DIAGNOSIS — I219 Acute myocardial infarction, unspecified: Secondary | ICD-10-CM | POA: Diagnosis not present

## 2023-04-21 DIAGNOSIS — I4891 Unspecified atrial fibrillation: Secondary | ICD-10-CM | POA: Diagnosis not present

## 2023-04-21 DIAGNOSIS — Z89611 Acquired absence of right leg above knee: Secondary | ICD-10-CM | POA: Diagnosis not present

## 2023-04-21 DIAGNOSIS — I96 Gangrene, not elsewhere classified: Secondary | ICD-10-CM | POA: Diagnosis not present

## 2023-04-21 DIAGNOSIS — Z89612 Acquired absence of left leg above knee: Secondary | ICD-10-CM | POA: Diagnosis not present

## 2023-04-27 ENCOUNTER — Telehealth: Payer: Self-pay | Admitting: Cardiology

## 2023-04-27 NOTE — Telephone Encounter (Signed)
 I s/w the pt's wife and assured her that we did clear the pt on 04/09/23 when he had his tele preop appt with Rise Paganini, NP. I reviewed ok to hold Eliquis x 3 days prior and resume once surgeon's feels it is safe.   Pt's wife thanked me for the help and the call. I told her that we had the labs that he had done recently.

## 2023-04-27 NOTE — Telephone Encounter (Signed)
 Pt's wife calling to f/u on Clearance as well as a text that she states they received regarding labs. Please advise

## 2023-05-11 NOTE — Progress Notes (Signed)
 Surgery orders requested via Epic inbox.

## 2023-05-12 NOTE — H&P (Signed)
 Patient's anticipated LOS is less than 2 midnights, meeting these requirements: - Younger than 43 - Lives within 1 hour of care - Has a competent adult at home to recover with post-op recover - NO history of  - Chronic pain requiring opiods  - Diabetes  - Coronary Artery Disease  - Heart failure  - Heart attack  - Stroke  - DVT/VTE  - Cardiac arrhythmia  - Respiratory Failure/COPD  - Renal failure  - Anemia  - Advanced Liver disease     [name] [name] is an 84 y.o. male.    Chief Complaint: right shoulder pain  HPI: Pt is a 83 y.o. male complaining of right shoulder pain for multiple years. Pain had continually increased since the beginning. X-rays in the clinic show end-stage arthritic changes of the right shoulder. Pt has tried various conservative treatments which have failed to alleviate their symptoms, including injections and therapy. Various options are discussed with the patient. Risks, benefits and expectations were discussed with the patient. Patient understand the risks, benefits and expectations and wishes to proceed with surgery.   PCP:  Marylen Ponto, MD  D/C Plans: Home  PMH: Past Medical History:  Diagnosis Date   Acute systolic congestive heart failure (HCC) 02/01/2016   Anemia    Anxiety    Arthritis    Atrial fibrillation (HCC)    Bell's palsy    Blood transfusion without reported diagnosis    CAD in native artery 09/19/2014   Overview:  1.s/p nonQwave MI 2001, PTCA and stent of LAD and subsequently repeat PTCA and stent of 2 lesions in LAD 08-19-00   2. MPS in March 2011 wo ischemia, EF 44%   3. Lexiscan MPS 07/07/11 wo ischemia, normal EF%   Cardiomyopathy (HCC) 03/26/2016   Carotid artery disease (HCC) 04/29/2016   Overview:  S/P right CEA   Carotid artery occlusion    CHF (congestive heart failure) (HCC)    COPD (chronic obstructive pulmonary disease) (HCC)    Coronary artery disease    Demand ischemia of myocardium (HCC) 01/15/2015    Depression    Essential hypertension 09/19/2014   High cholesterol 04/24/2017   History of atrial fibrillation 04/29/2016   HIT (heparin-induced thrombocytopenia) (HCC) 04/29/2016   Hyperlipidemia    Hypertension    Impaired mobility and activities of daily living 05/12/2016   Intermittent claudication (HCC) 02/24/2012   Ischemic cardiomyopathy 02/01/2016   Overview:  Added automatically from request for surgery [id number]   Lumbar spondylosis 04/24/2017   Myocardial infarction (HCC)    X's 2   Neuropathy 04/24/2017   Pain in limb 02/24/2012   PAOD (peripheral arterial occlusive disease) (HCC) 01/22/2016   Peripheral vascular disease (HCC)    Peripheral vascular disease, unspecified (HCC) 02/24/2012   S/P AKA (above knee amputation) unilateral, left (HCC) 05/12/2016   Seizures (HCC)    Sleep apnea    Stroke (HCC)     PSH: Past Surgical History:  Procedure Laterality Date    stimulator  Aug. 6, 2012   Implantation of Spinal Stimulator   ANGIOPLASTY  Dec. 2001   with stent   APPENDECTOMY     CAROTID ENDARTERECTOMY  Aug. 2002   RIGHT  cea   Catherization  June 2002   Cardiac   CHOLECYSTECTOMY     Gall Bladder- llaproscopic   COLON SURGERY  Jan. 2009   Ischemic   EYE SURGERY  1949   FEMORAL BYPASS     FINGER AMPUTATION  1960  Right  thumb   FRACTURE SURGERY     LEG AMPUTATION ABOVE KNEE Right    ROTATOR CUFF REPAIR  2007   Right  shoulder   SPINAL FUSION  Sept. 30, 2012   SPINE SURGERY  Feb. 2001   THROMBOENDARTERECTOMY      Social History:  reports that he has quit smoking. His smoking use included cigarettes. He started smoking about 22 years ago. He has never used smokeless tobacco. He reports that he does not drink alcohol and does not use drugs. BMI: Estimated body mass index is 41.2 kg/m as calculated from the following:   Height as of 12/22/22: 4' (1.219 m).   Weight as of 12/22/22: 61.2 kg.  Lab Results  Component Value Date   ALBUMIN 4.3 10/16/2020    Diabetes: Patient does not have a diagnosis of diabetes.     Smoking Status:      Allergies:  Allergies  Allergen Reactions   Codeine Other (See Comments)    "Makes me crazy"   Heparin Other (See Comments)    HIT 12/17 HIT 12/17 HIPA  Positive, SRA negative, hematology consulted, refer to admission from 01/2016   Percocet [Oxycodone-Acetaminophen] Anaphylaxis   Promethazine Hcl Other (See Comments)    "Makes me crazy"   Promethazine Hcl Other (See Comments)    "Makes me crazy"   Buprenorphine Hcl Rash   Morphine And Codeine Rash   Amitriptyline Other (See Comments) and Nausea Only   Iodinated Contrast Media Other (See Comments)   Lyrica [Pregabalin]     Aggressive behavior    Medications: No current facility-administered medications for this encounter.   Current Outpatient Medications  Medication Sig Dispense Refill   acetaminophen (TYLENOL) 325 MG tablet Take 325 mg by mouth every 6 (six) hours as needed for mild pain (pain score 1-3) or moderate pain (pain score 4-6).     carbamazepine (TEGRETOL XR) 100 MG 12 hr tablet Take 100 mg by mouth 2 (two) times daily.     cholecalciferol (VITAMIN D) 1000 UNITS tablet Take 1,000 Units by mouth daily. Reported on 02/21/2015     Cyanocobalamin (VITAMIN B-12) 5000 MCG TBDP Take 1 tablet by mouth daily.     ELIQUIS 5 MG TABS tablet Take 5 mg by mouth 2 (two) times daily.  1   fenofibrate 160 MG tablet Take 160 mg by mouth daily.     ferrous sulfate 325 (65 FE) MG tablet Take 325 mg by mouth daily with breakfast.     folic acid (FOLVITE) 1 MG tablet Take 1 mg by mouth daily.     furosemide (LASIX) 20 MG tablet Take 1 tablet (20 mg total) by mouth daily. 90 tablet 3   gabapentin (NEURONTIN) 300 MG capsule Take 1,200 mg by mouth 3 (three) times daily.      lisinopril (ZESTRIL) 10 MG tablet Take 1 tablet (10 mg total) by mouth daily. 90 tablet 3   Multiple Vitamin (MULTIVITAMIN) tablet Take 1 tablet by mouth daily.      nitroGLYCERIN (NITROSTAT) 0.4 MG SL tablet Place 1 tablet (0.4 mg total) under the tongue every 5 (five) minutes as needed for chest pain. 30 tablet 3   rosuvastatin (CRESTOR) 40 MG tablet Take 40 mg by mouth daily.     VASCEPA 1 g CAPS Take 2 g by mouth 2 (two) times daily.      No results found for this or any previous visit (from the past 48 hours). No results found.  ROS:  Pain with rom of the right upper extremity  Physical Exam: Alert and oriented 83 y.o. male in no acute distress Cranial nerves 2-12 intact Cervical spine: full rom with no tenderness, nv intact distally Chest: active breath sounds bilaterally, no wheeze rhonchi or rales Heart: regular rate and rhythm, no murmur Abd: non tender non distended with active bowel sounds Hip is stable with rom  Right shoulder painful and weak rom Nv intact distally No rashes or edema distally  Assessment/Plan Assessment: right shoulder cuff arthropathy   Plan:  Patient will undergo a right reverse total shoulder by Dr. Ranell Patrick at [place] Risks benefits and expectations were discussed with the patient. Patient understand risks, benefits and expectations and wishes to proceed. Preoperative templating of the joint replacement has been completed, documented, and submitted to the Operating Room personnel in order to optimize intra-operative equipment management.   Alphonsa Overall PA-C, MPAS [facility] is now [employer] [address], [address], [place], [place] [address] Phone: [phone] [email or web address] Facebook  [employer]

## 2023-05-14 NOTE — Progress Notes (Addendum)
 PCP - Juleen China, MD Clearance 03-04-23 media Cardiologist - Rise Paganini, NP tele visit clearance 04-09-23 epic  LOV 12-22-22 Dr. Norman Herrlich   PPM/ICD -  Device Orders -  Rep Notified -   Chest x-ray -  EKG - 12-22-22 epic Stress Test -  ECHO - 12-30-22  epic Cardiac Cath -   Sleep Study -  CPAP -   Fasting Blood Sugar -  Checks Blood Sugar _____ times a day  Blood Thinner Instructions:Eliquis hold 3 days  last dose 05-18-23 0600  Aspirin Instructions:  ERAS Protcol - PRE-SURGERY Ensure    COVID vaccine -  Activity--  Can complete ADL's without CP or SOB  Anesthesia review: COPD,OSA,CHF, bil AKA, Afib, seizures, CAD/stent, PVD, HTN, Mix2, CVA, Aortic valve stenosis, Bells Palsy,  Patient denies shortness of breath, fever, cough and chest pain at PAT appointment   All instructions explained to the patient, with a verbal understanding of the material. Patient agrees to go over the instructions while at home for a better understanding. Patient also instructed to self quarantine after being tested for COVID-19. The opportunity to ask questions was provided.

## 2023-05-14 NOTE — Patient Instructions (Addendum)
 SURGICAL WAITING ROOM VISITATION  Patients having surgery or a procedure may have no more than 2 support people in the waiting area - these visitors may rotate.    Children under the age of 29 must have an adult with them who is not the patient.  Due to an increase in RSV and influenza rates and associated hospitalizations, children ages 41 and under may not visit patients in [facility] hospitals.  Visitors with respiratory illnesses are discouraged from visiting and should remain at home.  If the patient needs to stay at the hospital during part of their recovery, the visitor guidelines for inpatient rooms apply. Pre-op nurse will coordinate an appropriate time for 1 support person to accompany patient in pre-op.  This support person may not rotate.    Please refer to the [facility] website for the visitor guidelines for Inpatients (after your surgery is over and you are in a regular room).       Your procedure is scheduled on: 05-22-23   Report to [facility] Main Entrance    Report to admitting at         0800  AM   Call this number if you have problems the morning of surgery [phone]   Do not eat food :After Midnight.   After Midnight you may have the following liquids until _0730 _____ AM/  DAY OF SURGERY  then nothing by mouth  Water Non-Citrus Juices (without pulp, NO RED-Apple, White grape, White cranberry) Black Coffee (NO MILK/CREAM OR CREAMERS, sugar ok)  Clear Tea (NO MILK/CREAM OR CREAMERS, sugar ok) regular and decaf                             Plain Jell-O (NO RED)                                           Fruit ices (not with fruit pulp, NO RED)                                     Popsicles (NO RED)                                                               Sports drinks like Gatorade (NO RED)                    The day of surgery:  Drink ONE (1) Pre-Surgery Clear Ensure by 0730  AM the morning of surgery. Drink in one sitting. Do not sip.   This drink was given to you during your hospital  pre-op appointment visit. Nothing else to drink after completing the  Pre-Surgery Clear Ensure           If you have questions, please contact your surgeon's office.   FOLLOW ANY ADDITIONAL PRE OP INSTRUCTIONS YOU RECEIVED FROM YOUR SURGEON'S OFFICE!!!     Oral Hygiene is also important to reduce your risk of infection.  Remember - BRUSH YOUR TEETH THE MORNING OF SURGERY WITH YOUR REGULAR TOOTHPASTE  DENTURES WILL BE REMOVED PRIOR TO SURGERY PLEASE DO NOT APPLY "Poly grip" OR ADHESIVES!!!   Do NOT smoke after Midnight   Stop all vitamins and herbal supplements 7 days before surgery.   Take these medicines the morning of surgery with A SIP OF WATER: gabapentin, fenofibrate  These are anesthesia recommendations for holding your anticoagulants.  Please contact your prescribing physician to confirm IF it is safe to hold your anticoagulants for this length of time.   Eliquis Apixaban   72 hours   Xarelto Rivaroxaban   72 hours  Plavix Clopidogrel   120 hours  Pletal Cilostazol   120 hours    Bring CPAP mask and tubing day of surgery.                              You may not have any metal on your body including hair pins, jewelry, and body piercing             Do not wear  lotions, powders, /cologne, or deodorant                Men may shave face and neck.   Do not bring valuables to the hospital. [facility] IS NOT             RESPONSIBLE   FOR VALUABLES.   Contacts, glasses, dentures or bridgework may not be worn into surgery.   Bring small overnight bag day of surgery.   DO NOT BRING YOUR HOME MEDICATIONS TO THE HOSPITAL. PHARMACY WILL DISPENSE MEDICATIONS LISTED ON YOUR MEDICATION LIST TO YOU DURING YOUR ADMISSION IN THE HOSPITAL!    Patients discharged on the day of surgery will not be allowed to drive home.  Someone NEEDS to stay with you for the first 24 hours after  anesthesia.   Special Instructions: Bring a copy of your healthcare power of attorney and living will documents the day of surgery if you haven't scanned them before.              Please read over the following fact sheets you were given: IF YOU HAVE QUESTIONS ABOUT YOUR PRE-OP INSTRUCTIONS PLEASE CALL [phone]    If you test positive for Covid or have been in contact with anyone that has tested positive in the last 10 days please notify you surgeon.    [facility]- Preparing for Total Shoulder Arthroplasty    Before surgery, you can play an important role. Because skin is not sterile, your skin needs to be as free of germs as possible. You can reduce the number of germs on your skin by using the following products. Benzoyl Peroxide Gel Reduces the number of germs present on the skin Applied twice a day to shoulder area starting two days before surgery    ==================================================================  Please follow these instructions carefully:  BENZOYL PEROXIDE 5% GEL  Please do not use if you have an allergy to benzoyl peroxide.   If your skin becomes reddened/irritated stop using the benzoyl peroxide.  Starting two days before surgery, apply as follows: Apply benzoyl peroxide in the morning and at night. Apply after taking a shower. If you are not taking a shower clean entire shoulder front, back, and side along with the armpit with a clean wet washcloth.  Place a quarter-sized dollop on your shoulder and rub in thoroughly, making sure  to cover the front, back, and side of your shoulder, along with the armpit.   2 days before ____ AM   ____ PM              1 day before ____ AM   ____ PM                         Do this twice a day for two days.  (Last application is the night before surgery, AFTER using the CHG soap as described below).  Do NOT apply benzoyl peroxide gel on the day of surgery.       Pre-operative 5 CHG Bath Instructions   You can  play a key role in reducing the risk of infection after surgery. Your skin needs to be as free of germs as possible. You can reduce the number of germs on your skin by washing with CHG (chlorhexidine gluconate) soap before surgery. CHG is an antiseptic soap that kills germs and continues to kill germs even after washing.   DO NOT use if you have an allergy to chlorhexidine/CHG or antibacterial soaps. If your skin becomes reddened or irritated, stop using the CHG and notify one of our RNs at [phone].   Please shower with the CHG soap starting 4 days before surgery using the following schedule:     Please keep in mind the following:  DO NOT shave, including legs and underarms, starting the day of your first shower.   You may shave your face at any point before/day of surgery.  Place clean sheets on your bed the day you start using CHG soap. Use a clean washcloth (not used since being washed) for each shower. DO NOT sleep with pets once you start using the CHG.   CHG Shower Instructions:  If you choose to wash your hair and private area, wash first with your normal shampoo/soap.  After you use shampoo/soap, rinse your hair and body thoroughly to remove shampoo/soap residue.  Turn the water OFF and apply about 3 tablespoons (45 ml) of CHG soap to a CLEAN washcloth.  Apply CHG soap ONLY FROM YOUR NECK DOWN TO YOUR TOES (washing for 3-5 minutes)  DO NOT use CHG soap on face, private areas, open wounds, or sores.  Pay special attention to the area where your surgery is being performed.  If you are having back surgery, having someone wash your back for you may be helpful. Wait 2 minutes after CHG soap is applied, then you may rinse off the CHG soap.  Pat dry with a clean towel  Put on clean clothes/pajamas   If you choose to wear lotion, please use ONLY the CHG-compatible lotions on the back of this paper.     Additional instructions for the day of surgery: DO NOT APPLY any lotions,  deodorants, cologne, or perfumes.   Put on clean/comfortable clothes.  Brush your teeth.  Ask your nurse before applying any prescription medications to the skin.      CHG Compatible Lotions   Aveeno Moisturizing lotion  Cetaphil Moisturizing Cream  Cetaphil Moisturizing Lotion  Clairol Herbal Essence Moisturizing Lotion, Dry Skin  Clairol Herbal Essence Moisturizing Lotion, Extra Dry Skin  Clairol Herbal Essence Moisturizing Lotion, Normal Skin  Curel Age Defying Therapeutic Moisturizing Lotion with Alpha Hydroxy  Curel Extreme Care Body Lotion  Curel Soothing Hands Moisturizing Hand Lotion  Curel Therapeutic Moisturizing Cream, Fragrance-Free  Curel Therapeutic Moisturizing Lotion, Fragrance-Free  Curel Therapeutic Moisturizing Lotion,  Original Formula  Eucerin Daily Replenishing Lotion  Eucerin Dry Skin Therapy Plus Alpha Hydroxy Crme  Eucerin Dry Skin Therapy Plus Alpha Hydroxy Lotion  Eucerin Original Crme  Eucerin Original Lotion  Eucerin Plus Crme Eucerin Plus Lotion  Eucerin TriLipid Replenishing Lotion  Keri Anti-Bacterial Hand Lotion  Keri Deep Conditioning Original Lotion Dry Skin Formula Softly Scented  Keri Deep Conditioning Original Lotion, Fragrance Free Sensitive Skin Formula  Keri Lotion Fast Absorbing Fragrance Free Sensitive Skin Formula  Keri Lotion Fast Absorbing Softly Scented Dry Skin Formula  Keri Original Lotion  Keri Skin Renewal Lotion Keri Silky Smooth Lotion  Keri Silky Smooth Sensitive Skin Lotion  Nivea Body Creamy Conditioning Oil  Nivea Body Extra Enriched Lotion  Nivea Body Original Lotion  Nivea Body Sheer Moisturizing Lotion Nivea Crme  Nivea Skin Firming Lotion  NutraDerm 30 Skin Lotion  NutraDerm Skin Lotion  NutraDerm Therapeutic Skin Cream  NutraDerm Therapeutic Skin Lotion  ProShield Protective Hand Cream   Incentive Spirometer  An incentive spirometer is a tool that can help keep your lungs clear and active. This tool  measures how well you are filling your lungs with each breath. Taking long deep breaths may help reverse or decrease the chance of developing breathing (pulmonary) problems (especially infection) following: A long period of time when you are unable to move or be active. BEFORE THE PROCEDURE  If the spirometer includes an indicator to show your best effort, your nurse or respiratory therapist will set it to a desired goal. If possible, sit up straight or lean slightly forward. Try not to slouch. Hold the incentive spirometer in an upright position. INSTRUCTIONS FOR USE  Sit on the edge of your bed if possible, or sit up as far as you can in bed or on a chair. Hold the incentive spirometer in an upright position. Breathe out normally. Place the mouthpiece in your mouth and seal your lips tightly around it. Breathe in slowly and as deeply as possible, raising the piston or the ball toward the top of the column. Hold your breath for 3-5 seconds or for as long as possible. Allow the piston or ball to fall to the bottom of the column. Remove the mouthpiece from your mouth and breathe out normally. Rest for a few seconds and repeat Steps 1 through 7 at least 10 times every 1-2 hours when you are awake. Take your time and take a few normal breaths between deep breaths. The spirometer may include an indicator to show your best effort. Use the indicator as a goal to work toward during each repetition. After each set of 10 deep breaths, practice coughing to be sure your lungs are clear. If you have an incision (the cut made at the time of surgery), support your incision when coughing by placing a pillow or rolled up towels firmly against it. Once you are able to get out of bed, walk around indoors and cough well. You may stop using the incentive spirometer when instructed by your caregiver.  RISKS AND COMPLICATIONS Take your time so you do not get dizzy or light-headed. If you are in pain, you may need to  take or ask for pain medication before doing incentive spirometry. It is harder to take a deep breath if you are having pain. AFTER USE Rest and breathe slowly and easily. It can be helpful to keep track of a log of your progress. Your caregiver can provide you with a simple table to help with this. If you  are using the spirometer at home, follow these instructions: SEEK MEDICAL CARE IF:  You are having difficultly using the spirometer. You have trouble using the spirometer as often as instructed. Your pain medication is not giving enough relief while using the spirometer. You develop fever of 100.5 F (38.1 C) or higher. SEEK IMMEDIATE MEDICAL CARE IF:  You cough up bloody sputum that had not been present before. You develop fever of 102 F (38.9 C) or greater. You develop worsening pain at or near the incision site. MAKE SURE YOU:  Understand these instructions. Will watch your condition. Will get help right away if you are not doing well or get worse. Document Released: 06/23/2006 Document Revised: 05/05/2011 Document Reviewed: 08/24/2006 [facility] Patient Information 2014 [place], [place].   ________________________________________________________________________

## 2023-05-18 ENCOUNTER — Telehealth: Payer: Self-pay

## 2023-05-18 ENCOUNTER — Encounter (HOSPITAL_COMMUNITY)
Admission: RE | Admit: 2023-05-18 | Discharge: 2023-05-18 | Disposition: A | Discharge status: Home / Self Care | Source: Ambulatory Visit | Attending: Orthopedic Surgery | Admitting: Orthopedic Surgery

## 2023-05-18 ENCOUNTER — Telehealth: Payer: Self-pay | Admitting: Cardiology

## 2023-05-18 ENCOUNTER — Encounter (HOSPITAL_COMMUNITY): Payer: Self-pay | Admitting: Physician Assistant

## 2023-05-18 ENCOUNTER — Other Ambulatory Visit: Payer: Self-pay

## 2023-05-18 ENCOUNTER — Encounter (HOSPITAL_COMMUNITY): Payer: Self-pay

## 2023-05-18 VITALS — BP 100/60 | HR 59 | Temp 98.2°F | Resp 16 | Ht <= 58 in

## 2023-05-18 DIAGNOSIS — I5032 Chronic diastolic (congestive) heart failure: Secondary | ICD-10-CM | POA: Insufficient documentation

## 2023-05-18 DIAGNOSIS — I110 Hypertensive heart disease with heart failure: Secondary | ICD-10-CM | POA: Insufficient documentation

## 2023-05-18 DIAGNOSIS — Z01818 Encounter for other preprocedural examination: Secondary | ICD-10-CM | POA: Diagnosis not present

## 2023-05-18 LAB — CBC
HCT: 33.1 % — ABNORMAL LOW (ref 39.0–52.0)
Hemoglobin: 10.9 g/dL — ABNORMAL LOW (ref 13.0–17.0)
MCH: 31.9 pg (ref 26.0–34.0)
MCHC: 32.9 g/dL (ref 30.0–36.0)
MCV: 96.8 fL (ref 80.0–100.0)
Platelets: 139 10*3/uL — ABNORMAL LOW (ref 150–400)
RBC: 3.42 MIL/uL — ABNORMAL LOW (ref 4.22–5.81)
RDW: 12.9 % (ref 11.5–15.5)
WBC: 4.9 10*3/uL (ref 4.0–10.5)
nRBC: 0 % (ref 0.0–0.2)

## 2023-05-18 LAB — BASIC METABOLIC PANEL
Anion gap: 8 (ref 5–15)
BUN: 26 mg/dL — ABNORMAL HIGH (ref 8–23)
CO2: 26 mmol/L (ref 22–32)
Calcium: 9.1 mg/dL (ref 8.9–10.3)
Chloride: 107 mmol/L (ref 98–111)
Creatinine, Ser: 1.03 mg/dL (ref 0.61–1.24)
GFR, Estimated: >60 mL/min (ref >60)
Glucose, Bld: 102 mg/dL — ABNORMAL HIGH (ref 70–99)
Potassium: 4 mmol/L (ref 3.5–5.1)
Sodium: 141 mmol/L (ref 135–145)

## 2023-05-18 LAB — SURGICAL PCR SCREEN
MRSA, PCR: NEGATIVE
Staphylococcus aureus: NEGATIVE

## 2023-05-18 NOTE — Progress Notes (Signed)
 BP at preop R arm 96/56 Dinamap, 100/56 manual  L arm  194/54,197/65 Dinamap. Jessica Ward PA-C aware at preop. Pt. C/o of some dizziness and fatigue. Pt.advised to be checked out at urgent care and reach out to his cardiologist if BP remains low.Pt. VU.

## 2023-05-18 NOTE — Telephone Encounter (Signed)
 pt at pre-op appt at hosp for surg on 3/28 Left arm 194/54 197/65 195/55  Right arm 100/60 96/56 92/56   Pt c/o BP issue: STAT if pt c/o blurred vision, one-sided weakness or slurred speech.  STAT if BP is GREATER than 180/120 TODAY.  STAT if BP is LESS than 90/60 and SYMPTOMATIC TODAY  1. What is your BP concern? pt at pre-op appt at hosp for surg on 3/28 and bp elevated, they wanted pt to call office to advise  2. Have you taken any BP medication today? yes  3. What are your last 5 BP readings? Left arm 194/54 197/65 195/55  Right arm 100/60 96/56 92/56   4. Are you having any other symptoms (ex. Dizziness, headache, blurred vision, passed out)? dizziness

## 2023-05-19 ENCOUNTER — Telehealth: Payer: Self-pay

## 2023-05-19 DIAGNOSIS — R0989 Other specified symptoms and signs involving the circulatory and respiratory systems: Secondary | ICD-10-CM

## 2023-05-19 MED ORDER — PREDNISONE 50 MG PO TABS: ORAL_TABLET | ORAL | 0 refills | Status: DC

## 2023-05-19 NOTE — Telephone Encounter (Signed)
 CT angio of right upper extremity ordered per Dr. Vincent Gros, Vascular surgery referral made. Prednisone referral made.

## 2023-05-19 NOTE — Addendum Note (Signed)
 Addended by: Baldo Ash D on: 05/19/2023 12:51 PM   Modules accepted: Orders

## 2023-05-19 NOTE — Telephone Encounter (Signed)
 Dr. Madireddy's note: "Significant discrepancy in blood pressure readings bilateral upper extremities, with notably reduced blood pressures on the right upper extremity via left upper extremity blood pressures are markedly elevated.   This is a complex situation as he is also being scheduled for right shoulder arthroplasty.  This would be a difficult situation and suspect there would be concern about wound healing with low blood pressures onto the right upper extremity if there is a significant subclavian stenosis.  With a systolic gradient greater than 90 mmHg, escalating blood pressure lowering medicines for readings on the left upper extremity may result in symptoms of right upper extremity ischemia.   I would recommend they have prompt evaluation with vascular surgery team [referral to them and also ordered a CTA upper extremities] to rule out any significant stenosis and any further intervention required prior to proceeding with shoulder surgery."  Non-Cardiac CT Angiography (CTA), is a special type of CT scan that uses a computer to produce multi-dimensional views of major blood vessels throughout the body. In CT angiography, a contrast material is injected through an IV to help visualize the blood vessels   Prednisone 50 mg - take 13 hours prior to test 2. Take another Prednisone 50 mg 7 hours prior to test 3. Take another Prednisone 50 mg 1 hour prior to test 4. Take Benadryl 50 mg 1 hour prior to test

## 2023-05-20 NOTE — Telephone Encounter (Signed)
    Primary Cardiologist:None  Chart reviewed as part of pre-operative protocol coverage. Because of Obdulio Mash Bogard's past medical history and time since last visit, he/she will require a follow-up visit in order to better assess preoperative cardiovascular risk.  Pre-op covering staff: - Please schedule in office appointment and call patient to inform them. - Please contact requesting surgeon's office via preferred method (i.e, phone, fax) to inform them of need for appointment prior to surgery.  If applicable, this message will also be routed to pharmacy pool and/or primary cardiologist for input on holding anticoagulant/antiplatelet agent as requested below so that this information is available at time of patient's appointment.   Ronney Asters, NP  05/20/2023, 12:24 PM

## 2023-05-20 NOTE — Telephone Encounter (Signed)
 Office calling requesting that pt be seen again to be cleared for surgery.

## 2023-05-20 NOTE — Telephone Encounter (Signed)
 S/w pt's spouse to schedule pre op appt for tomorrow due to pt having surgery Friday. Pt is scheduled with Dr. Odis Hollingshead on DOD green slot. Called Megan at [phone] ortho requesting a new f/u appt. To let Aundra Millet know pt is scheduled in office for appt. Will remove from per op pool.

## 2023-05-21 ENCOUNTER — Telehealth: Payer: Self-pay

## 2023-05-21 ENCOUNTER — Ambulatory Visit: Admitting: Cardiology

## 2023-05-21 ENCOUNTER — Encounter: Payer: Self-pay | Admitting: Cardiology

## 2023-05-21 ENCOUNTER — Ambulatory Visit
Admission: RE | Admit: 2023-05-21 | Discharge: 2023-05-21 | Disposition: A | Discharge status: Home / Self Care | Source: Ambulatory Visit | Attending: Cardiology | Admitting: Cardiology

## 2023-05-21 VITALS — BP 105/60 | HR 68

## 2023-05-21 DIAGNOSIS — R9431 Abnormal electrocardiogram [ECG] [EKG]: Secondary | ICD-10-CM | POA: Diagnosis not present

## 2023-05-21 DIAGNOSIS — Z7901 Long term (current) use of anticoagulants: Secondary | ICD-10-CM | POA: Insufficient documentation

## 2023-05-21 DIAGNOSIS — R0989 Other specified symptoms and signs involving the circulatory and respiratory systems: Secondary | ICD-10-CM | POA: Insufficient documentation

## 2023-05-21 DIAGNOSIS — I480 Paroxysmal atrial fibrillation: Secondary | ICD-10-CM | POA: Diagnosis not present

## 2023-05-21 DIAGNOSIS — Z01810 Encounter for preprocedural cardiovascular examination: Secondary | ICD-10-CM | POA: Diagnosis not present

## 2023-05-21 DIAGNOSIS — I350 Nonrheumatic aortic (valve) stenosis: Secondary | ICD-10-CM | POA: Diagnosis not present

## 2023-05-21 DIAGNOSIS — I25119 Atherosclerotic heart disease of native coronary artery with unspecified angina pectoris: Secondary | ICD-10-CM | POA: Insufficient documentation

## 2023-05-21 NOTE — Telephone Encounter (Signed)
 Spoke with pt over the phone and went over [employer]- advised pt to not consume any caffeine after 10:15 PM tonight to prepare for the stress test tomorrow. Pt and his wife verbalized understanding of plan of care.

## 2023-05-21 NOTE — Patient Instructions (Addendum)
 Medication Instructions:  Your physician recommends that you continue on your current medications as directed. Please refer to the Current Medication list given to you today.  *If you need a refill on your cardiac medications before your next appointment, please call your pharmacy*  Lab Work: None ordered today. If you have labs (blood work) drawn today and your tests are completely normal, you will receive your results only by: MyChart Message (if you have MyChart) OR A paper copy in the mail If you have any lab test that is abnormal or we need to change your treatment, we will call you to review the results.  Testing/Procedures: Your physician has requested that you have a lexiscan myoview. For further information please visit [email or web address] Please follow instruction sheet, as given.  Your physician has requested that you have a upper extremity arterial duplex. This test is an ultrasound of the arteries in the legs or arms. It looks at arterial blood flow in the legs and arms. Allow one hour for Lower and Upper Arterial scans. There are no restrictions or special instructions.  Please note: We ask at that you not bring children with you during ultrasound (echo/ vascular) testing. Due to room size and safety concerns, children are not allowed in the ultrasound rooms during exams. Our front office staff cannot provide observation of children in our lobby area while testing is being conducted. An adult accompanying a patient to their appointment will only be allowed in the ultrasound room at the discretion of the ultrasound technician under special circumstances. We apologize for any inconvenience.   Follow-Up: At [facility], you and your health needs are our priority.  As part of our continuing mission to provide you with exceptional heart care, we have created designated Provider Care Teams.  These Care Teams include your primary Cardiologist (physician) and Advanced Practice  Providers (APPs -  Physician Assistants and Nurse Practitioners) who all work together to provide you with the care you need, when you need it.  We recommend signing up for the patient portal called "MyChart".  Sign up information is provided on this After Visit Summary.  MyChart is used to connect with patients for Virtual Visits (Telemedicine).  Patients are able to view lab/test results, encounter notes, upcoming appointments, etc.  Non-urgent messages can be sent to your provider as well.   To learn more about what you can do with MyChart, go to [email or web address].    Your next appointment:   You will need a follow-up appointment with Dr. Dulce Sellar for cardiac clearance.  The format for your next appointment:   In Person  Provider:   Norman Herrlich, MD{  Other Instructions A vascular surgery consult was placed by Dr. Vincent Gros. Someone will be reaching out to you to schedule this appointment.

## 2023-05-21 NOTE — Telephone Encounter (Signed)
 Contacted [facility] (Dr. Ranell Patrick office) and spoke to Dr. Ranell Patrick nurse. Advised that pt will need to be seen by vascular surgery before surgery due to absent right radial pulse. She advised to please fax over Dr. Emelda Brothers office note when completed to Dr. Ranell Patrick at [phone].

## 2023-05-21 NOTE — Progress Notes (Signed)
 Cardiology Office Note:  .   Date:  05/21/2023  ID:  [name] [name], DOB 07-26-1940, MRN [id number] PCP:  Marylen Ponto, MD  Former Cardiology Providers: Dr. Norman Herrlich [facility] Providers Cardiologist:  Dr. Dulce Sellar Electrophysiologist:  None  Click to update primary MD,subspecialty MD or APP then REFRESH:1}    Chief Complaint  Patient presents with   Pre-op Exam   Follow-up    History of Present Illness: [name] [name] is a 83 y.o. Caucasian male whose past medical history and cardiovascular risk factors includes: CAD s/p NSTEMI with PTCA and PCI of LAD in 2001, ICM, HTN, HLD, HFrEF, CVA.   Patient considered for right reverse shoulder arthroplasty.  Is referred to cardiology despite preoperative risk assessment provided back in January 2025 due to concerns for asymmetric and hypotensive readings in the upper extremities.  Based on Dr. Hulen Shouts last progress note 12/22/2022 patient is noted to have a coronary angiogram on 03/26/2016 which noted single-vessel CAD with chronic total occlusion of the right coronary which was felt to be adequate left to right collaterals, 60% stenosis of the mid circumflex, and calcified LAD with 20 to 30% stenosis with a previous stent patent.  Patient is tentatively scheduled for surgery tomorrow 05/22/2023 for right reverse shoulder arthroplasty; however, as part of his surgical screening he is noted to have asymmetrical blood pressures right lower than left and therefore referred to cardiology for further guidance prior to tomorrow's surgery.  Clinically denies anginal chest pain but overall functional capacity is limited due to bilateral AKA secondary to PAD, per patient.  Clinically is euvolemic and not in heart failure.  Review of prior progress notes also report left upper extremity having high blood pressures.  He had a carotid duplex back in 2023 which noted turbulent flow within the left subclavian but no findings reported for  subclavian steal syndrome and/or blockages.  Patient is symptomatic with prolonged overhead activities as he has right arm pain.  He has been attributing that to arthritis and shoulder issues.  Please refer to the progress note from 05/19/2023 - Dr. Madireddy's note.  Who recommended CTA of the upper extremities as well as a vascular consult.  However the CT is still not scheduled.  When asked patient and wife state that he is allergic to contrast and would like to minimize contrast exposure at this time.  Despite knowing that he can be prepped for contrast allergy.  Review of Systems: .   Review of Systems  HENT:         Hard of hearing.  Cardiovascular:  Negative for chest pain, claudication, irregular heartbeat, leg swelling, near-syncope, orthopnea, palpitations, paroxysmal nocturnal dyspnea and syncope.  Respiratory:  Negative for shortness of breath.   Hematologic/Lymphatic: Negative for bleeding problem.  Musculoskeletal:        Right arm pain with overuse.  arthritis of the right shoulder as well.  Neurological:  Positive for light-headedness.    Studies Reviewed:   EKG: EKG Interpretation Date/Time:  Thursday May 21 2023 10:39:58 EDT Ventricular Rate:  72 PR Interval:  146 QRS Duration:  112 QT Interval:  390 QTC Calculation: 427 R Axis:   55  Text Interpretation: Normal sinus rhythm ST & T wave abnormality, consider inferolateral ischemia When compared with ECG of 18-May-2023 15:18, T wave inversion more evident in Inferior leads Confirmed by Tessa Lerner [phone]) on 05/21/2023 10:58:48 AM  Echocardiogram: 12/30/2022: 1. Technically difficult study.   2. No obvious  LV wall motion abnormality observed on this study. Left  ventricular ejection fraction, by estimation, is 60 to 65%. The left  ventricle has normal function. Left ventricular endocardial border not  optimally defined to evaluate regional wall   motion. There is mild concentric left ventricular hypertrophy. Left   ventricular diastolic parameters are indeterminate. The average left  ventricular global longitudinal strain is -10.7 %. The global longitudinal  strain is abnormal.   3. Right ventricular systolic function is normal. The right ventricular  size is normal. Tricuspid regurgitation signal is inadequate for assessing  PA pressure.   4. Left atrial size was moderately dilated.   5. The mitral valve is degenerative. Mild mitral valve regurgitation.  Mild mitral stenosis. The mean mitral valve gradient is 3.0 mmHg with  average heart rate of 74 bpm. Moderate mitral annular calcification.   6. The aortic valve was not well visualized. There is moderate  calcification of the aortic valve. Aortic valve regurgitation is mild.  Mild to moderate aortic valve stenosis. Aortic valve area, by VTI measures  1.10 cm. Aortic valve mean gradient  measures 13.0 mmHg. Aortic valve Vmax measures 2.47 m/s.   7. The inferior vena cava is normal in size with greater than 50%  respiratory variability, suggesting right atrial pressure of 3 mmHg.   US carotid duplex 04/2021: Right Carotid: Velocities in the right ICA are consistent with a 1-39% stenosis.   Left Carotid: Velocities in the left ICA are consistent with a 40-59% stenosis.   Vertebrals: Bilateral vertebral arteries demonstrate antegrade flow.  Subclavians: Left subclavian artery flow was disturbed. Normal flow hemodynamics were seen in bilateral subclavian arteries.   RADIOLOGY: NA  Risk Assessment/Calculations:   NA   Labs:       Latest Ref Rng & Units 05/18/2023    2:48 PM 03/19/2023    2:25 PM 04/11/2020   11:20 AM  CBC  WBC 4.0 - 10.5 K/uL 4.9  5.0  5.4   Hemoglobin 13.0 - 17.0 g/dL 78.2  95.6  21.3   Hematocrit 39.0 - 52.0 % 33.1  35.7  37.0   Platelets 150 - 400 K/uL 139  172  183        Latest Ref Rng & Units 05/18/2023    2:48 PM 10/16/2020    1:55 PM 04/11/2020   11:20 AM  BMP  Glucose 70 - 99 mg/dL 086  91  87   BUN 8 -  23 mg/dL 26  25  23    Creatinine 0.61 - 1.24 mg/dL 5.78  4.69  6.29   BUN/Creat Ratio 10 - 24  23  18    Sodium 135 - 145 mmol/L 141  139  143   Potassium 3.5 - 5.1 mmol/L 4.0  4.0  4.4   Chloride 98 - 111 mmol/L 107  100  105   CO2 22 - 32 mmol/L 26  22  24    Calcium 8.9 - 10.3 mg/dL 9.1  52.8  9.9       Latest Ref Rng & Units 05/18/2023    2:48 PM 10/16/2020    1:55 PM 04/11/2020   11:20 AM  CMP  Glucose 70 - 99 mg/dL 413  91  87   BUN 8 - 23 mg/dL 26  25  23    Creatinine 0.61 - 1.24 mg/dL 2.44  0.10  2.72   Sodium 135 - 145 mmol/L 141  139  143   Potassium 3.5 - 5.1 mmol/L 4.0  4.0  4.4   Chloride 98 - 111 mmol/L 107  100  105   CO2 22 - 32 mmol/L 26  22  24    Calcium 8.9 - 10.3 mg/dL 9.1  56.2  9.9   Total Protein 6.0 - 8.5 g/dL  6.7  6.8   Total Bilirubin 0.0 - 1.2 mg/dL  0.4  0.2   Alkaline Phos 44 - 121 IU/L  50  49   AST 0 - 40 IU/L  22  23   ALT 0 - 44 IU/L  18  22     Lab Results  Component Value Date   CHOL 157 10/16/2020   HDL 29 (L) 10/16/2020   LDLCALC 90 10/16/2020   TRIG 225 (H) 10/16/2020   CHOLHDL 5.4 (H) 10/16/2020   No results for input(s): "LIPOA" in the last 8760 hours. No components found for: "NTPROBNP" No results for input(s): "PROBNP" in the last 8760 hours. No results for input(s): "TSH" in the last 8760 hours.  Physical Exam:    Today's Vitals   05/21/23 1038 05/21/23 1041  BP: (!) 189/64 105/60  Pulse: 67 68  SpO2: 97% 96%   There is no height or weight on file to calculate BMI. Wt Readings from Last 3 Encounters:  12/22/22 135 lb (61.2 kg)  05/27/16 143 lb (64.9 kg)  02/26/16 164 lb (74.4 kg)    Physical Exam  Constitutional: No distress. He appears chronically ill.  hemodynamically stable, presents in a wheelchair  Neck: No JVD present.  Cardiovascular: Normal rate, regular rhythm, S1 normal and S2 normal. Exam reveals no gallop, no S3 and no S4.  Murmur heard. Midsystolic murmur is present with a grade of 3/6 at the upper  right sternal border. Pulses:      Carotid pulses are  on the right side with bruit and  on the left side with bruit. No subclavian bruits appreciated on auscultation  Right arm: radial and ulnar pulse not well apprecipated on exam. Warm hands. No discoloration or open wounds.   Left arm: Radial pulse +2 and ulnar palpable.   Pulmonary/Chest: Effort normal and breath sounds normal. No stridor. He has no wheezes. He has no rales.  Abdominal: Soft. He exhibits no distension. There is no abdominal tenderness.  Musculoskeletal:        General: No edema.     Cervical back: Neck supple.     Comments: Bilateral AKA  Neurological: He is alert and oriented to person, place, and time.  Skin: Skin is warm.   Impression & Recommendation(s):  Impression:   ICD-10-CM   1. Preoperative cardiovascular examination  Z01.810 EKG 12-Lead    VAS [place] UPPER EXTREMITY ARTERIAL DUPLEX    2. Coronary artery disease involving native coronary artery of native heart with angina pectoris (HCC)  I25.119 MYOCARDIAL PERFUSION IMAGING    3. Nonspecific abnormal electrocardiogram (ECG) (EKG)  R94.31 MYOCARDIAL PERFUSION IMAGING    4. Absent radial pulse  R09.89 VAS [place] UPPER EXTREMITY ARTERIAL DUPLEX    VAS [place] UPPER EXTREMITY ARTERIAL DUPLEX    5. PAF (paroxysmal atrial fibrillation) (HCC)  I48.0     6. Chronic anticoagulation  Z79.01     7. Nonrheumatic aortic valve stenosis  I35.0        Recommendation(s):  Preoperative cardiovascular examination Coronary artery disease involving native coronary artery of native heart with angina pectoris (HCC) Nonspecific abnormal electrocardiogram (ECG) (EKG) Scheduled for right reverse shoulder arthroplasty 05/22/2023 Was referred today to be seen by DOD for  preoperative risk stratification. Denies anginal chest pain or heart failure symptoms. EKG shows sinus rhythm with ST depressions in the inferolateral leads more pronounced compared to prior tracings.  Known history  of CTO of the right with known collaterals and also disease in the LCx.  This raises a question for possible progression of CAD as well.  No recent ischemic workup.  Due to bilateral AKA amputations overall functional capacity is limited to further decision making. Prior to upcoming elective surgery given his complex CAD, more pronounced ST-T changes, recommend stress test for further risk stratification. Will follow-up with Dr. Dulce Sellar.  Absent radial pulse Preoperative risk assessment noted to have asymmetrical upper extremity pressures. Right upper extremity 105/60, left upper extremity 189/64. Has had similar trends in the past based on prior progress notes (Left arm BP being elevated). Last carotid duplex noted PSV of subclavian < 250 Since this is a chronic finding acute pathology less likely. But it does raise concern for vascular disease involving the subclavian artery. Difficult to palpate right radial or ulnar artery, presumed absent..  No open wounds, warm to touch I suspect that he may have dopplerable pulses or collateral circulation. However, due to weak pulses and low BP he is at high risk of poor wound healing and postoperative complications. Recommended CTA to further evaluate the the anatomy; however, they would like to minimize contrast exposure given his allergies.  We discussed that he would be premedicated but they would like to hold off until they see vascular surgery.  Therefore recommend bilateral upper extremity arterial duplex and will consult vascular surgery.  PAF (paroxysmal atrial fibrillation) (HCC) Chronic anticoagulation Currently in sinus rhythm. Continue oral anticoagulation for thromboembolic prophylaxis Patient does not endorse evidence of bleeding.  Nonrheumatic aortic valve stenosis Noted to have mild to moderate aortic stenosis back in 2024. Clinically denies anginal chest pain, heart failure symptoms, or syncope. Will need follow-up echocardiogram, to  be arranged by primary cardiology  Given his vascular findings, asymmetrical blood pressures, and concerns for possible poor wound healing postsurgery recommend holding off elective surgery for now until he is evaluated by vascular surgery.  In the interim we will order arterial duplexes as mentioned above as well as pharmacological stress test for further risk stratification.   Will forward the note to his primary cardiologist Dr. Dulce Sellar to help further coordinate his care and preoperative risk assessment.  We did reach out to Dr. Elmon Else office with regards to having the surgery rescheduled as long as it is elective. Office staff requested the note be sent to them as well post visit.   Total time spent: 54 minutes.  Patient seen for sick visit/preop for surgery that is tomorrow 05/22/2023.  Given his comorbidities and physical examination finding additional testing is ordered/requested.  His wife informs me that this is an elective procedure and therefore recommended rescheduling it once the workup is complete.  We did reach out to Dr. Elmon Else office to convey the findings and recommendations.  Will also send a message to Dr. Hulen Shouts office/covering to help coordinate his care.  A vascular consult has already been placed for the same.  Plan of care discussed with both patient and wife.   Orders Placed:  Orders Placed This Encounter  Procedures   MYOCARDIAL PERFUSION IMAGING    Standing Status:   Future    Expiration Date:   05/20/2024    Where should this be performed?:   [facility]    Type of  stress:   Lexiscan   EKG 12-Lead     Final Medication List:   No orders of the defined types were placed in this encounter.   Medications Discontinued During This Encounter  Medication Reason   predniSONE (DELTASONE) 50 MG tablet Patient Preference     Current Outpatient Medications:    fenofibrate 160 MG tablet, Take 160 mg by mouth in the morning., Disp: , Rfl:     gabapentin (NEURONTIN) 300 MG capsule, Take 1,200 mg by mouth 3 (three) times daily. , Disp: , Rfl:    acetaminophen (TYLENOL) 650 MG CR tablet, Take 1,300 mg by mouth at bedtime. (Patient not taking: Reported on 05/21/2023), Disp: , Rfl:    apixaban (ELIQUIS) 5 MG TABS tablet, Take 5 mg by mouth 2 (two) times daily. (Patient not taking: Reported on 05/21/2023), Disp: , Rfl:    carbamazepine (TEGRETOL XR) 100 MG 12 hr tablet, Take 100 mg by mouth at bedtime. (Patient not taking: Reported on 05/21/2023), Disp: , Rfl:    Cyanocobalamin (VITAMIN B-12) 5000 MCG TBDP, Take 5,000 mcg by mouth in the morning. (Patient not taking: Reported on 05/21/2023), Disp: , Rfl:    folic acid (FOLVITE) 1 MG tablet, Take 1 mg by mouth daily. (Patient not taking: Reported on 05/21/2023), Disp: , Rfl:    furosemide (LASIX) 20 MG tablet, Take 1 tablet (20 mg total) by mouth daily. (Patient not taking: Reported on 05/21/2023), Disp: 90 tablet, Rfl: 3   lisinopril (ZESTRIL) 5 MG tablet, Take 5 mg by mouth in the morning. (Patient not taking: Reported on 05/21/2023), Disp: , Rfl:    Multiple Vitamin (MULTIVITAMIN) tablet, Take 1 tablet by mouth daily. (Patient not taking: Reported on 05/21/2023), Disp: , Rfl:    nitroGLYCERIN (NITROSTAT) 0.4 MG SL tablet, Place 1 tablet (0.4 mg total) under the tongue every 5 (five) minutes as needed for chest pain. (Patient not taking: Reported on 05/21/2023), Disp: 30 tablet, Rfl: 3   rosuvastatin (CRESTOR) 40 MG tablet, Take 40 mg by mouth every evening. (Patient not taking: Reported on 05/21/2023), Disp: , Rfl:   Consent:   NA  Disposition:   Will follow up w/ Dr. Dulce Sellar post-testing.   His questions and concerns were addressed to his satisfaction. He voices understanding of the recommendations provided during this encounter.    Signed, Tessa Lerner, DO, [facility]  [facility]  [address] [place], [place] [address] 05/21/2023 7:51 PM

## 2023-05-22 ENCOUNTER — Ambulatory Visit (INDEPENDENT_AMBULATORY_CARE_PROVIDER_SITE_OTHER)
Admission: RE | Admit: 2023-05-22 | Discharge: 2023-05-22 | Disposition: A | Discharge status: Home / Self Care | Source: Ambulatory Visit | Attending: Cardiology | Admitting: Cardiology

## 2023-05-22 ENCOUNTER — Telehealth: Payer: Self-pay

## 2023-05-22 ENCOUNTER — Ambulatory Visit (HOSPITAL_COMMUNITY)
Admission: RE | Admit: 2023-05-22 | Discharge: 2023-05-22 | Disposition: A | Discharge status: Home / Self Care | Source: Ambulatory Visit | Attending: Cardiology | Admitting: Cardiology

## 2023-05-22 ENCOUNTER — Other Ambulatory Visit: Payer: Self-pay | Admitting: *Deleted

## 2023-05-22 ENCOUNTER — Ambulatory Visit (HOSPITAL_BASED_OUTPATIENT_CLINIC_OR_DEPARTMENT_OTHER)

## 2023-05-22 VITALS — Ht <= 58 in | Wt 135.0 lb

## 2023-05-22 DIAGNOSIS — I6529 Occlusion and stenosis of unspecified carotid artery: Secondary | ICD-10-CM | POA: Insufficient documentation

## 2023-05-22 DIAGNOSIS — R0989 Other specified symptoms and signs involving the circulatory and respiratory systems: Secondary | ICD-10-CM

## 2023-05-22 DIAGNOSIS — R110 Nausea: Secondary | ICD-10-CM

## 2023-05-22 DIAGNOSIS — R9431 Abnormal electrocardiogram [ECG] [EKG]: Secondary | ICD-10-CM | POA: Insufficient documentation

## 2023-05-22 DIAGNOSIS — I25119 Atherosclerotic heart disease of native coronary artery with unspecified angina pectoris: Secondary | ICD-10-CM | POA: Insufficient documentation

## 2023-05-22 LAB — MYOCARDIAL PERFUSION IMAGING
LV dias vol: 140 mL (ref 62–150)
LV sys vol: 96 mL
Nuc Stress EF: 31 %
Peak HR: 90 {beats}/min
Rest HR: 77 {beats}/min
Rest Nuclear Isotope Dose: 10.6 mCi
SDS: 1
SRS: 1
SSS: 2
ST Depression (mm): 0 mm
Stress Nuclear Isotope Dose: 31.8 mCi
TID: 1.06

## 2023-05-22 SURGERY — ARTHROPLASTY, SHOULDER, TOTAL, REVERSE
Anesthesia: General | Site: Shoulder | Laterality: Right

## 2023-05-22 MED ORDER — TECHNETIUM TC 99M TETROFOSMIN IV KIT
31.8000 | PACK | Freq: Once | INTRAVENOUS | Status: AC | PRN
  Administered 2023-05-22: 31.8 via INTRAVENOUS

## 2023-05-22 MED ORDER — METOPROLOL SUCCINATE ER 25 MG PO TB24: 25.0000 mg | ORAL_TABLET | Freq: Every day | ORAL | 3 refills | Status: DC

## 2023-05-22 MED ORDER — TECHNETIUM TC 99M TETROFOSMIN IV KIT
10.6000 | PACK | Freq: Once | INTRAVENOUS | Status: AC | PRN
  Administered 2023-05-22: 10.6 via INTRAVENOUS

## 2023-05-22 MED ORDER — AMINOPHYLLINE 25 MG/ML IV SOLN
75.0000 mg | Freq: Once | INTRAVENOUS | Status: AC
  Administered 2023-05-22: 75 mg via INTRAVENOUS

## 2023-05-22 MED ORDER — REGADENOSON 0.4 MG/5ML IV SOLN
0.4000 mg | Freq: Once | INTRAVENOUS | Status: AC
  Administered 2023-05-22: 0.4 mg via INTRAVENOUS

## 2023-05-22 NOTE — Telephone Encounter (Signed)
 The patient has been notified of the result and verbalized understanding.  All questions (if any) were answered. [name] Chick, RN 05/22/2023   Patient has appointment next week with cardiology and Dr. Randie Heinz with [facility].

## 2023-05-22 NOTE — Telephone Encounter (Signed)
-----   Message from [facility] sent at 05/22/2023  4:20 PM EDT ----- Abnormal stress test.  Please have him follow up with his primary cardiologist or covering to discuss the following steps.  Study was done as part of pre-op due to more prominent EKG changes compared to prior tracing.  He did not have chest pain.  If he has new chest pain prior to his appt w/ cardiology please advise him to go to ER via EMS for further evaluation and management.  Continue statin.  Start Toprol XL 25 mg po qday (hold if SBP <119mmHG or HR < 60bpm) He also has sublingual nitroglycerin tabs to use on prn basis.   Sunit Albee, DO, [facility]  Arlys John,  Please see him in follow sooner than May. Call me if needed.   Thanks,   [employer]

## 2023-05-27 ENCOUNTER — Ambulatory Visit: Admitting: Vascular Surgery

## 2023-05-27 ENCOUNTER — Encounter: Payer: Self-pay | Admitting: Vascular Surgery

## 2023-05-27 VITALS — BP 91/56 | HR 58 | Temp 98.2°F

## 2023-05-27 DIAGNOSIS — I771 Stricture of artery: Secondary | ICD-10-CM

## 2023-05-27 NOTE — Progress Notes (Signed)
 Patient ID: [name] [name], male   DOB: October 25, 1940, 83 y.o.   MRN: [id number]  Reason for Consult: New Patient (Initial Visit)   Referred by Marylen Ponto, MD  Subjective:     HPI:  [name] [name] is a 83 y.o. male history of right carotid endarterectomy by Dr. Madilyn Fireman in 2022.  He subsequently had bilateral above-knee amputations performed awake for several years ago.  He now has injured his right shoulder moving a trailer and is indicated for right shoulder arthroplasty.  He does not have palpable pulses in the right upper extremity was plan for CT angio but was unable to be performed due to contrast allergy.  He does not have any right hand or left hand symptoms and there are no wounds.  States that blood pressure is always lower in the right than the left.  Past Medical History:  Diagnosis Date   Acute systolic congestive heart failure (HCC) 02/01/2016   Amputation of both lower extremities (HCC) 08/05/2017   Anemia    Anxiety    Arthritis    Atrial fibrillation (HCC)    Bell's palsy    Blood transfusion without reported diagnosis    CAD in native artery 09/19/2014   Overview:  1.s/p nonQwave MI 2001, PTCA and stent of LAD and subsequently repeat PTCA and stent of 2 lesions in LAD 08-19-00   2. MPS in March 2011 wo ischemia, EF 44%   3. Lexiscan MPS 07/07/11 wo ischemia, normal EF%   Cardiomyopathy (HCC) 03/26/2016   Carotid artery disease (HCC) 04/29/2016   Overview:  S/P right CEA   Carotid artery occlusion    CHF (congestive heart failure) (HCC)    Chronic anticoagulation 04/26/2017   Chronic combined systolic and diastolic heart failure (HCC) 02/01/2016   COPD (chronic obstructive pulmonary disease) (HCC)    Coronary artery disease    Degenerative joint disease of shoulder region 12/21/2022   Demand ischemia of myocardium (HCC) 01/15/2015   Depression    loss of two children and a grandchild   Essential hypertension 09/19/2014   Full thickness rotator cuff tear  10/14/2022   High cholesterol 04/24/2017   History of atrial fibrillation 04/29/2016   HIT (heparin-induced thrombocytopenia) (HCC) 04/29/2016   Hyperlipidemia    Hypertension    Hypertensive heart disease 09/19/2014   Impaired mobility and activities of daily living 05/12/2016   Injury of right rotator cuff 07/17/2022   Intermittent claudication (HCC) 02/24/2012   Ischemic cardiomyopathy 02/01/2016   Overview:  Added automatically from request for surgery [id number]   Lumbar spondylosis 04/24/2017   Myocardial infarction (HCC)    X's 6   Neuropathy 04/24/2017   PAF (paroxysmal atrial fibrillation) (HCC) 04/26/2017   Pain in limb 02/24/2012   PAOD (peripheral arterial occlusive disease) (HCC) 01/22/2016   Peripheral vascular disease (HCC)    Peripheral vascular disease, unspecified (HCC) 02/24/2012   Preoperative cardiovascular examination 04/26/2017   S/P AKA (above knee amputation) unilateral, left (HCC) 05/12/2016   Seizures (HCC)    Sleep apnea    Stroke (HCC)    Family History  Problem Relation Age of Onset   Hypertension Mother    Heart disease Father    Hyperlipidemia Father    Hypertension Father    Heart attack Father    Cancer Sister        Breast   Hypertension Sister    Hyperlipidemia Brother    Cancer Daughter        Breast  Deep vein thrombosis Son    Heart disease Son        Heart Disease before age 83   Hyperlipidemia Son    Hypertension Son    Heart attack Son    Past Surgical History:  Procedure Laterality Date    stimulator  09/30/2010   Implantation of Spinal Stimulator    removed  now   ANGIOPLASTY  01/2000   with stent   APPENDECTOMY     CAROTID ENDARTERECTOMY  09/2000   RIGHT  cea   Catherization  07/2000   Cardiac   CHOLECYSTECTOMY     Gall Bladder- llaproscopic   COLON SURGERY  02/2007   Ischemic   EYE SURGERY  1949   FEMORAL BYPASS     FINGER AMPUTATION  1960   Right  thumb   FRACTURE SURGERY     LEG AMPUTATION ABOVE KNEE  Bilateral    Bil AKA   ROTATOR CUFF REPAIR  2007   Right  shoulder   SPINAL FUSION  11/24/2010   SPINE SURGERY  03/1999   THROMBOENDARTERECTOMY      Short Social History:  Social History   Tobacco Use   Smoking status: Former    Types: Cigarettes    Start date: 02/24/2001   Smokeless tobacco: Never  Substance Use Topics   Alcohol use: No    Allergies  Allergen Reactions   Codeine Other (See Comments)    "Makes me crazy"   Heparin Other (See Comments)    HIT 12/17 HIT 12/17 HIPA  Positive, SRA negative, hematology consulted, refer to admission from 01/2016   Percocet [Oxycodone-Acetaminophen] Anaphylaxis   Promethazine Hcl Other (See Comments)    "Makes me crazy"   Buprenorphine Hcl Rash   Morphine And Codeine Rash   Amitriptyline Other (See Comments) and Nausea Only   Iodinated Contrast Media Other (See Comments)   Lyrica [Pregabalin]     Aggressive behavior    Current Outpatient Medications  Medication Sig Dispense Refill   acetaminophen (TYLENOL) 650 MG CR tablet Take 1,300 mg by mouth at bedtime.     apixaban (ELIQUIS) 5 MG TABS tablet Take 5 mg by mouth 2 (two) times daily.     carbamazepine (TEGRETOL XR) 100 MG 12 hr tablet Take 100 mg by mouth at bedtime.     Cyanocobalamin (VITAMIN B-12) 5000 MCG TBDP Take 5,000 mcg by mouth in the morning.     fenofibrate 160 MG tablet Take 160 mg by mouth in the morning.     folic acid (FOLVITE) 1 MG tablet Take 1 mg by mouth daily.     furosemide (LASIX) 20 MG tablet Take 1 tablet (20 mg total) by mouth daily. 90 tablet 3   gabapentin (NEURONTIN) 300 MG capsule Take 1,200 mg by mouth 3 (three) times daily.      lisinopril (ZESTRIL) 5 MG tablet Take 5 mg by mouth in the morning.     metoprolol succinate (TOPROL XL) 25 MG 24 hr tablet Take 1 tablet (25 mg total) by mouth daily. hold if SBP (top number on blood pressure reading) is less than 110 mmHG or heart rate less than 60 bpm. 90 tablet 3   Multiple Vitamin (MULTIVITAMIN)  tablet Take 1 tablet by mouth daily.     nitroGLYCERIN (NITROSTAT) 0.4 MG SL tablet Place 1 tablet (0.4 mg total) under the tongue every 5 (five) minutes as needed for chest pain. 30 tablet 3   rosuvastatin (CRESTOR) 40 MG tablet Take 40 mg  by mouth every evening.     No current facility-administered medications for this visit.    Review of Systems  Constitutional:  Constitutional negative. HENT: HENT negative.  Eyes: Eyes negative.  Cardiovascular: Cardiovascular negative.  GI: Gastrointestinal negative.  Musculoskeletal:       Right shoulder pain Skin: Skin negative.  Neurological: Neurological negative. Hematologic: Hematologic/lymphatic negative.  Psychiatric: Psychiatric negative.        Objective:  Objective   Vitals:   05/27/23 1057 05/27/23 1101  BP: (!) 172/63 (!) 91/56  Pulse: (!) 58   Temp: 98.2 F (36.8 C)    There is no height or weight on file to calculate BMI.  Physical Exam HENT:     Head: Normocephalic.     Mouth/Throat:     Mouth: Mucous membranes are moist.  Eyes:     Pupils: Pupils are equal, round, and reactive to light.  Neck:     Vascular: Carotid bruit present.  Cardiovascular:     Pulses:          Radial pulses are 0 on the right side and 2+ on the left side.     Comments: Bruit over right subclavian Strong right radial, right ulnar and right palmar arch signals with Doppler Abdominal:     General: Abdomen is flat.  Musculoskeletal:     Comments: Bilateral aka  Skin:    General: Skin is warm.  Neurological:     Mental Status: He is alert.  Psychiatric:        Mood and Affect: Mood normal.     Data: Left Doppler Findings:  +---------------+----------+--------+--------+--------+  Site          PSV (cm/s)WaveformStenosisComments  +---------------+----------+--------+--------+--------+  Subclavian Prox122       biphasic                  +---------------+----------+--------+--------+--------+  Subclavian Mid 79         biphasic                  +---------------+----------+--------+--------+--------+  Subclavian Dist83        biphasic                  +---------------+----------+--------+--------+--------+  Axillary      62        biphasic                  +---------------+----------+--------+--------+--------+  Brachial Prox  85        biphasic                  +---------------+----------+--------+--------+--------+  Brachial Mid   98        biphasic                  +---------------+----------+--------+--------+--------+  Brachial Dist  107       biphasic                  +---------------+----------+--------+--------+--------+  Radial Prox    84        biphasic                  +---------------+----------+--------+--------+--------+  Radial Mid     84        biphasic                  +---------------+----------+--------+--------+--------+  Radial Dist    143       biphasic                  +---------------+----------+--------+--------+--------+  Ulnar Prox                               nwv       +---------------+----------+--------+--------+--------+  Ulnar Mid                                vwn       +---------------+----------+--------+--------+--------+  Ulnar Dist     49        biphasic                  +---------------+----------+--------+--------+--------+        Summary:    Left: Limited visualization of the left ulnar artery, otherwise        upper extremity appears patent without evidence of stenosis.        Assessment/Plan:    83 year old male with history of right carotid endarterectomy and has discrepant blood pressures left greater than right with palpable left radial pulse and no palpable pulses in the right but he does have strong signals throughout the right wrist and hand to suggest that he is well compensated and should heal from right shoulder arthroplasty.  I would not recommend any invasive  intervention given the vascularity of the shoulder.  Carotid Dopplers can be followed with his cardiologist and he can see me on an as-needed basis.    Maeola Harman MD [facility]

## 2023-05-28 ENCOUNTER — Ambulatory Visit

## 2023-05-28 VITALS — BP 90/60 | HR 70 | Ht <= 58 in

## 2023-05-28 DIAGNOSIS — Z01810 Encounter for preprocedural cardiovascular examination: Secondary | ICD-10-CM | POA: Diagnosis not present

## 2023-05-28 DIAGNOSIS — R931 Abnormal findings on diagnostic imaging of heart and coronary circulation: Secondary | ICD-10-CM | POA: Diagnosis not present

## 2023-05-28 NOTE — Progress Notes (Unsigned)
 Cardiology Consultation:    Date:  05/28/2023   ID:  [name] [name], DOB [birth year], MRN [id number]  PCP:  [name] Ponto, MD  Cardiologist:  [name] Corporal Brionne Mertz, MD   Referring MD: [name] Ponto, MD   No chief complaint on file.    ASSESSMENT AND PLAN:   [name] 83 year old male patient with significant past medical history of CAD prior PCI's in last cath from January 2018 showing CTO of RCA with distal collaterals and nonobstructive disease in LCx and LAD, recovered ischemic cardiomyopathy with recent echocardiogram from November 2024 showing LVEF 60 to 65%, chronic CHF without any recent heart failure related episodes or admissions to the hospital, mild to moderate valvular disease involving the aortic valve with stenosis and insufficiency, mild mitral regurgitation, paroxysmal A-fib remains in sinus rhythm and on anticoagulation with Eliquis, hypertension, hyperlipidemia, extensive peripheral vascular disease s/p right carotid endarterectomy, bilateral lower extremity above-knee amputations, with significant discrepancy in systolic blood pressures between the left upper arm and right upper arm with a gradient greater than 80 mmHg.  Anticipating surgery for the right shoulder. He does not have any active cardiac symptoms. Cardiac risk assessment with stress test nuclear imaging May 22, 2023 showed peri-infarct ischemia involving the basal to apical inferior segments corresponding to the RCA territory disease however overall ischemia burden does not appear to be high.  LVEF on this was calculated to be reduced 31%.  However recent echocardiogram from November 2024 showed normal LVEF.   Problem List Items Addressed This Visit     Preop cardiovascular exam   No active cardiac symptoms.  Significant underlying cardiac history and peripheral vascular disease history as above. Functional status difficult to assess given his bilateral lower extremity amputation and use of motorized  wheelchair. Recent high risk findings on Lexiscan stress test with small amount of peri-infarct ischemia based on partially reversible perfusion abnormality in the basal to apical inferior segments corresponding to his underlying RCA disease as known from prior cardiac cath in 2018.  -He is a gentleman with elevated perioperative cardiovascular risk for MI. - However I do not see him to be having any active cardiac symptoms and do not expect any cardiac intervention to alter his cardiac risk in any meaningful way.  - His surgery for the right shoulder is an elective procedure for ongoing pain affecting his day-to-day quality of life. -In addition his discrepancy in the blood pressures with elevated blood pressures in the left upper extremity with gradient over 90 mmHg, further escalation of blood pressure medications to target his blood pressure may result in significant hypotension in the right lower extremity.  I had a frank discussion with him about overall perioperative cardiovascular risk versus nonsurgical management of his shoulder.  -After this they are willing to reconsider surgical versus medical/conservative therapy for the right shoulder pain management and will update [place] with their decision.  - If he decides to proceed with the elective surgery, okay to proceed as being planned.  He remains at elevated risk for perioperative cardiac complications. Perioperative blood pressure management should be done keeping in mind to avoid significant hypotension to the surgical site while maintaining optimal blood pressures to permit proceeding with the surgery, esmolol drip IV perioperatively can help achieve this and would defer to anesthesia.  From cardiac standpoint I will obtain a limited echocardiogram to assess his LVEF given the low LVEF reported on the SPECT imaging.      Other Visit Diagnoses  Decreased cardiac ejection fraction    -  Primary   Relevant Orders   ECHOCARDIOGRAM  LIMITED         History of Present Illness:    [name] [name] is a 83 y.o. male who is being seen today for follow-up visit. PCP is [name] Ponto, MD.  Follows up with Dr. Dulce [name] at our office.  Last visit with him was 12-22-2022. Recently had a preop visit with Dr. Odis [name] on 05-21-2023 and here for further discussion regarding his overall cardiovascular risk prior to the elective right shoulder surgery that he plans to get done. This my first time seeing him. Has extensive cardiac and vascular history.  Reviewed the chart.  He has history of CAD s/p NSTEMI with prior PTCA and PCI of LAD in 2001 last coronary angiogram January 2018 with CTO of RCA with distal left-to-right collaterals and nonobstructive disease in mid LCx and LAD; ischemic cardiomyopathy with recovered LVEF 60 to 65% on echocardiogram November 2024 (was 35-40% in 2017; 60-65% in 04/2020), chronic CHF, mild mitral stenosis mean gradient 3 mmHg at heart rate 74/min, mild mitral regurgitation, mild aortic insufficiency, mild to moderate aortic stenosis on echocardiogram November 2024, paroxysmal atrial fibrillation (on Eliquis), hypertension, hyperlipidemia, CVA, CKD, PVD s/p right CEA, b/l lower extremity above knee amputation due to vascular complications.  As part of his ongoing evaluation for preoperative cardiovascular risk assessment prior to his right shoulder surgery he was initially identified to have significant discrepancy in blood pressure readings between the left upper arm and right upper arm with a gradient of up to 90 mmHg and the systolic readings. In the setting further vascular evaluation was recommended and he had preop cardiovascular risk assessment with Dr.Tolia.  Stress test with nuclear imaging was completed 05-22-2023 as per Dr. Emelda Brothers recommendations. Stress test was reported abnormal for LV perfusion with evidence of ischemia. "Abnormal, high risk stress nuclear study with prior inferior infarct and  very mild peri-infarct ischemia; gated ejection fraction 31% with global hypokinesis and akinesis of the basal inferior wall. Study interpreted as high risk due to reduced LV function. "  Essentially partially reversible defect involving basal to apical inferior segments suggesting peri-infarct ischemia.  LV function on this was reported to be EF 31% however I feel this is less accurate compared to the recent echocardiogram from November 2024 where the EF was normal given the limitation of gated SPECT imaging in predicting EF..  Carotid artery duplex study from 05-14-2023 noted no significant stenosis in the carotids.  Bilateral vertebral arteries demonstrate antegrade flow. Right subclavian pressure was documented 105 mmHg in the left subclavian pressure was documented 200 mmHg.  Upper extremity duplex study was completed 05-14-2023, unfortunately appears that this is only a left-sided assessment which as expected did not show any significant obstructive concerns.  Unclear why the right side was not completed and only unilateral was done.  However he did undergo evaluation with vascular surgeon Dr.Cain yesterday who evaluated him for discrepancy in the circulation in the upper extremities and noted despite lack of palpable pulses in the right upper extremity on their evaluation they noted strong signals throughout the right wrist and the hand and expect him to heal from any right shoulder arthroplasty.  In the setting he is here for further evaluation. Accompanied by his wife. He is a double amputee.  Manages his own affairs by sliding in and out of his motorized wheelchair using a board.  Uses Van with a ramp to get  in and out using his motorized wheelchair. Denies any chest pain, shortness of breath, orthopnea.  Denies any palpitations, lightheadedness or syncopal episodes.  Functional status is limited due to pain of the right shoulder.  Past Medical History:  Diagnosis Date   Acute systolic  congestive heart failure (HCC) 02/01/2016   Amputation of both lower extremities (HCC) 08/05/2017   Anemia    Anxiety    Arthritis    Atrial fibrillation (HCC)    Bell's palsy    Blood transfusion without reported diagnosis    CAD in native artery 09/19/2014   Overview:  1.s/p nonQwave MI 2001, PTCA and stent of LAD and subsequently repeat PTCA and stent of 2 lesions in LAD 08-19-00   2. MPS in March 2011 wo ischemia, EF 44%   3. Lexiscan MPS 07/07/11 wo ischemia, normal EF%   Cardiomyopathy (HCC) 03/26/2016   Carotid artery disease (HCC) 04/29/2016   Overview:  S/P right CEA   Carotid artery occlusion    CHF (congestive heart failure) (HCC)    Chronic anticoagulation 04/26/2017   Chronic combined systolic and diastolic heart failure (HCC) 02/01/2016   COPD (chronic obstructive pulmonary disease) (HCC)    Coronary artery disease    Degenerative joint disease of shoulder region 12/21/2022   Demand ischemia of myocardium (HCC) 01/15/2015   Depression    loss of two children and a grandchild   Essential hypertension 09/19/2014   Full thickness rotator cuff tear 10/14/2022   High cholesterol 04/24/2017   History of atrial fibrillation 04/29/2016   HIT (heparin-induced thrombocytopenia) (HCC) 04/29/2016   Hyperlipidemia    Hypertension    Hypertensive heart disease 09/19/2014   Impaired mobility and activities of daily living 05/12/2016   Injury of right rotator cuff 07/17/2022   Intermittent claudication (HCC) 02/24/2012   Ischemic cardiomyopathy 02/01/2016   Overview:  Added automatically from request for surgery [id number]   Lumbar spondylosis 04/24/2017   Myocardial infarction (HCC)    X's 6   Neuropathy 04/24/2017   PAF (paroxysmal atrial fibrillation) (HCC) 04/26/2017   Pain in limb 02/24/2012   PAOD (peripheral arterial occlusive disease) (HCC) 01/22/2016   Peripheral vascular disease (HCC)    Peripheral vascular disease, unspecified (HCC) 02/24/2012   Preoperative  cardiovascular examination 04/26/2017   S/P AKA (above knee amputation) unilateral, left (HCC) 05/12/2016   Seizures (HCC)    Sleep apnea    Stroke [facility])     Past Surgical History:  Procedure Laterality Date    stimulator  09/30/2010   Implantation of Spinal Stimulator    removed  now   ANGIOPLASTY  01/2000   with stent   APPENDECTOMY     CAROTID ENDARTERECTOMY  09/2000   RIGHT  cea   Catherization  07/2000   Cardiac   CHOLECYSTECTOMY     Gall Bladder- llaproscopic   COLON SURGERY  02/2007   Ischemic   EYE SURGERY  1949   FEMORAL BYPASS     FINGER AMPUTATION  1960   Right  thumb   FRACTURE SURGERY     LEG AMPUTATION ABOVE KNEE Bilateral    Bil AKA   ROTATOR CUFF REPAIR  2007   Right  shoulder   SPINAL FUSION  11/24/2010   SPINE SURGERY  03/1999   THROMBOENDARTERECTOMY      Current Medications: No outpatient medications have been marked as taking for the 05/28/23 encounter (Office Visit) with Jozalyn Baglio, [name] Corporal, MD.     Allergies:   Codeine, Heparin, Percocet [oxycodone-acetaminophen],  Promethazine hcl, Buprenorphine hcl, Morphine and codeine, Amitriptyline, Iodinated contrast media, and Lyrica [pregabalin]   Social History   Socioeconomic History   Marital status: Married    Spouse name: Not on file   Number of children: Not on file   Years of education: Not on file   Highest education level: Not on file  Occupational History   Not on file  Tobacco Use   Smoking status: Former    Types: Cigarettes    Start date: 02/24/2001   Smokeless tobacco: Never  Vaping Use   Vaping status: Never Used  Substance and Sexual Activity   Alcohol use: No   Drug use: No   Sexual activity: Not Currently  Other Topics Concern   Not on file  Social History Narrative   Not on file   Social Drivers of Health   Financial Resource Strain: Not on file  Food Insecurity: Not on file  Transportation Needs: Not on file  Physical Activity: Not on file  Stress: Not on file   Social Connections: Not on file     Family History: The patient's family history includes Cancer in his daughter and sister; Deep vein thrombosis in his son; Heart attack in his father and son; Heart disease in his father and son; Hyperlipidemia in his brother, father, and son; Hypertension in his father, mother, sister, and son. ROS:   Please see the history of present illness.    All 14 point review of systems negative except as described per history of present illness.  EKGs/Labs/Other Studies Reviewed:    The following studies were reviewed today:   EKG:       Recent Labs: 05/18/2023: BUN 26; Creatinine, Ser 1.03; Hemoglobin 10.9; Platelets 139; Potassium 4.0; Sodium 141  Recent Lipid Panel    Component Value Date/Time   CHOL 157 10/16/2020 1355   TRIG 225 (H) 10/16/2020 1355   HDL 29 (L) 10/16/2020 1355   CHOLHDL 5.4 (H) 10/16/2020 1355   LDLCALC 90 10/16/2020 1355    Physical Exam:    VS:  BP 90/60 (BP Location: Right Arm, Patient Position: Sitting, Cuff Size: Normal)   Pulse 70   Ht 4' (1.219 m)   SpO2 94%   BMI 41.20 kg/m     Wt Readings from Last 3 Encounters:  05/22/23 135 lb (61.2 kg)  12/22/22 135 lb (61.2 kg)  05/27/16 143 lb (64.9 kg)     GENERAL:  Well nourished, well developed in no acute distress NECK: No JVD; No carotid bruits CARDIAC: RRR, S1 and S2 present, no murmurs, no rubs, no gallops CHEST:  Clear to auscultation without rales, wheezing or rhonchi  Extremities: b/l above knee amputation; left radial pulse 2+. Right radial pulse not palpable NEUROLOGIC:  Alert and oriented x 3  Medication Adjustments/Labs and Tests Ordered: Current medicines are reviewed at length with the patient today.  Concerns regarding medicines are outlined above.  Orders Placed This Encounter  Procedures   ECHOCARDIOGRAM LIMITED   No orders of the defined types were placed in this encounter.   Signed, Cecille Amsterdam, MD, MPH, [facility]. 05/28/2023 6:13 PM     [facility]

## 2023-05-28 NOTE — Patient Instructions (Signed)
 Medication Instructions:  Your physician recommends that you continue on your current medications as directed. Please refer to the Current Medication list given to you today.  *If you need a refill on your cardiac medications before your next appointment, please call your pharmacy*   Lab Work: None ordered If you have labs (blood work) drawn today and your tests are completely normal, you will receive your results only by: MyChart Message (if you have MyChart) OR A paper copy in the mail If you have any lab test that is abnormal or we need to change your treatment, we will call you to review the results.   Testing/Procedures: Your physician has requested that you have an echocardiogram. Echocardiography is a painless test that uses sound waves to create images of your heart. It provides your doctor with information about the size and shape of your heart and how well your heart's chambers and valves are working. This procedure takes approximately one hour. There are no restrictions for this procedure. Please do NOT wear cologne, perfume, aftershave, or lotions (deodorant is allowed). Please arrive 15 minutes prior to your appointment time.     Follow-Up: At [facility], you and your health needs are our priority.  As part of our continuing mission to provide you with exceptional heart care, we have created designated Provider Care Teams.  These Care Teams include your primary Cardiologist (physician) and Advanced Practice Providers (APPs -  Physician Assistants and Nurse Practitioners) who all work together to provide you with the care you need, when you need it.  We recommend signing up for the patient portal called "MyChart".  Sign up information is provided on this After Visit Summary.  MyChart is used to connect with patients for Virtual Visits (Telemedicine).  Patients are able to view lab/test results, encounter notes, upcoming appointments, etc.  Non-urgent messages can be sent to  your provider as well.   To learn more about what you can do with MyChart, go to [email or web address].    Your next appointment:   3 month(s)  The format for your next appointment:   In Person  Provider:   Norman Herrlich, MD   Other Instructions Echocardiogram An echocardiogram is a test that uses sound waves (ultrasound) to produce images of the heart. Images from an echocardiogram can provide important information about: Heart size and shape. The size and thickness and movement of your heart's walls. Heart muscle function and strength. Heart valve function or if you have stenosis. Stenosis is when the heart valves are too narrow. If blood is flowing backward through the heart valves (regurgitation). A tumor or infectious growth around the heart valves. Areas of heart muscle that are not working well because of poor blood flow or injury from a heart attack. Aneurysm detection. An aneurysm is a weak or damaged part of an artery wall. The wall bulges out from the normal force of blood pumping through the body. Tell a health care provider about: Any allergies you have. All medicines you are taking, including vitamins, herbs, eye drops, creams, and over-the-counter medicines. Any blood disorders you have. Any surgeries you have had. Any medical conditions you have. Whether you are pregnant or may be pregnant. What are the risks? Generally, this is a safe test. However, problems may occur, including an allergic reaction to dye (contrast) that may be used during the test. What happens before the test? No specific preparation is needed. You may eat and drink normally. What happens during the test? You will  take off your clothes from the waist up and put on a hospital gown. Electrodes or electrocardiogram (ECG)patches may be placed on your chest. The electrodes or patches are then connected to a device that monitors your heart rate and rhythm. You will lie down on a table for an  ultrasound exam. A gel will be applied to your chest to help sound waves pass through your skin. A handheld device, called a transducer, will be pressed against your chest and moved over your heart. The transducer produces sound waves that travel to your heart and bounce back (or "echo" back) to the transducer. These sound waves will be captured in real-time and changed into images of your heart that can be viewed on a video monitor. The images will be recorded on a computer and reviewed by your health care provider. You may be asked to change positions or hold your breath for a short time. This makes it easier to get different views or better views of your heart. In some cases, you may receive contrast through an IV in one of your veins. This can improve the quality of the pictures from your heart. The procedure may vary among health care providers and hospitals.   What can I expect after the test? You may return to your normal, everyday life, including diet, activities, and medicines, unless your health care provider tells you not to do that. Follow these instructions at home: It is up to you to get the results of your test. Ask your health care provider, or the department that is doing the test, when your results will be ready. Keep all follow-up visits. This is important. Summary An echocardiogram is a test that uses sound waves (ultrasound) to produce images of the heart. Images from an echocardiogram can provide important information about the size and shape of your heart, heart muscle function, heart valve function, and other possible heart problems. You do not need to do anything to prepare before this test. You may eat and drink normally. After the echocardiogram is completed, you may return to your normal, everyday life, unless your health care provider tells you not to do that. This information is not intended to replace advice given to you by your health care provider. Make sure you  discuss any questions you have with your health care provider. Document Revised: 10/04/2019 Document Reviewed: 10/04/2019 Elsevier Patient Education  2021 Elsevier Inc.   Important Information About Sugar

## 2023-05-28 NOTE — Assessment & Plan Note (Signed)
 No active cardiac symptoms.  Significant underlying cardiac history and peripheral vascular disease history as above. Functional status difficult to assess given his bilateral lower extremity amputation and use of motorized wheelchair. Recent high risk findings on Lexiscan stress test with small amount of peri-infarct ischemia based on partially reversible perfusion abnormality in the basal to apical inferior segments corresponding to his underlying RCA disease as known from prior cardiac cath in 2018.  -He is a gentleman with elevated perioperative cardiovascular risk for MI. - However I do not see him to be having any active cardiac symptoms and do not expect any cardiac intervention to alter his cardiac risk in any meaningful way.  - His surgery for the right shoulder is an elective procedure for ongoing pain affecting his day-to-day quality of life. -In addition his discrepancy in the blood pressures with elevated blood pressures in the left upper extremity with gradient over 90 mmHg, further escalation of blood pressure medications to target his blood pressure may result in significant hypotension in the right lower extremity.  I had a frank discussion with him about overall perioperative cardiovascular risk versus nonsurgical management of his shoulder.  -After this they are willing to reconsider surgical versus medical/conservative therapy for the right shoulder pain management and will update [place] with their decision.  - If he decides to proceed with the elective surgery, okay to proceed as being planned.  He remains at elevated risk for perioperative cardiac complications. Perioperative blood pressure management should be done keeping in mind to avoid significant hypotension to the surgical site while maintaining optimal blood pressures to permit proceeding with the surgery, esmolol drip IV perioperatively can help achieve this and would defer to anesthesia.  From cardiac standpoint I will  obtain a limited echocardiogram to assess his LVEF given the low LVEF reported on the SPECT imaging.

## 2023-05-29 DIAGNOSIS — R931 Abnormal findings on diagnostic imaging of heart and coronary circulation: Secondary | ICD-10-CM | POA: Insufficient documentation

## 2023-05-29 HISTORY — DX: Abnormal findings on diagnostic imaging of heart and coronary circulation: R93.1

## 2023-05-29 NOTE — Assessment & Plan Note (Signed)
 recent echocardiogram from normal 2024 shows normal LVEF 60 to 65%. SPECT imaging on recent stress test with nuclear imaging reported reduced EF 31%.  This may not be accurate reflection of the true LVEF given the limitations of Lexiscan nuclear SPECT imaging.  Will obtain a limited echocardiogram to assess the EF given the discrepancy.

## 2023-06-19 ENCOUNTER — Ambulatory Visit

## 2023-06-19 DIAGNOSIS — R931 Abnormal findings on diagnostic imaging of heart and coronary circulation: Secondary | ICD-10-CM

## 2023-06-19 DIAGNOSIS — I5030 Unspecified diastolic (congestive) heart failure: Secondary | ICD-10-CM | POA: Diagnosis not present

## 2023-06-19 DIAGNOSIS — Z01810 Encounter for preprocedural cardiovascular examination: Secondary | ICD-10-CM

## 2023-06-19 DIAGNOSIS — I080 Rheumatic disorders of both mitral and aortic valves: Secondary | ICD-10-CM | POA: Diagnosis not present

## 2023-06-19 LAB — ECHOCARDIOGRAM LIMITED
AR max vel: 0.99 cm2
AV Area VTI: 0.99 cm2
AV Area mean vel: 1 cm2
AV Mean grad: 10 mmHg
AV Peak grad: 18.1 mmHg
Ao pk vel: 2.13 m/s
Area-P 1/2: 3.5 cm2
MV M vel: 7.3 m/s
MV Peak grad: 213.2 mmHg
MV VTI: 1.16 cm2
P 1/2 time: 413 ms
S' Lateral: 4.1 cm

## 2023-06-26 ENCOUNTER — Ambulatory Visit: Admitting: Cardiology

## 2023-07-22 ENCOUNTER — Telehealth: Payer: Self-pay | Admitting: Cardiology

## 2023-07-22 ENCOUNTER — Ambulatory Visit: Payer: Self-pay

## 2023-07-22 NOTE — Telephone Encounter (Signed)
 Spoke with [name] per DPR who states that the pt had a echo done in April but has not received results. [name] states pt is trying to get cleared for reverse shoulder surgery. Please advise

## 2023-07-22 NOTE — Telephone Encounter (Signed)
 Pt's wife is requesting a callback regarding ECHO results. She stated they were never contacted to go over the readings. Please advise.

## 2023-07-23 NOTE — Telephone Encounter (Signed)
 Left vm to return call.

## 2023-07-24 ENCOUNTER — Encounter (HOSPITAL_COMMUNITY): Admission: RE | Payer: Self-pay | Source: Home / Self Care

## 2023-07-24 ENCOUNTER — Ambulatory Visit (HOSPITAL_COMMUNITY): Admission: RE | Admit: 2023-07-24 | Source: Home / Self Care | Admitting: Orthopedic Surgery

## 2023-07-24 NOTE — Telephone Encounter (Signed)
 Called the patient's wife and informed her of the patient's echocardiogram results. Patient's wife verbalized understanding and had a question about whether he could proceed with having his shoulder surgery. After reviewing the patient's chart, Dr. Ronell Coe, in his office note stated that he could proceed with his planned shoulder surgery, but he was at an elevated cardiac risk as stated below:  "If he decides to proceed with the elective surgery, okay to proceed as being planned. He remains at elevated risk for perioperative cardiac complications"  The patient's wife was informed of this and the patient's office note was faxed to Dr. Brunilda Capra, the surgeon per her request. The patient's wife had no further questions at this time.

## 2023-07-24 NOTE — Telephone Encounter (Signed)
 Pt was returning call to nurse and is requesting a callback

## 2023-07-30 ENCOUNTER — Encounter: Payer: Self-pay | Admitting: Cardiology

## 2023-07-31 DIAGNOSIS — Z1339 Encounter for screening examination for other mental health and behavioral disorders: Secondary | ICD-10-CM | POA: Diagnosis not present

## 2023-07-31 DIAGNOSIS — Z1331 Encounter for screening for depression: Secondary | ICD-10-CM | POA: Diagnosis not present

## 2023-07-31 DIAGNOSIS — Z0000 Encounter for general adult medical examination without abnormal findings: Secondary | ICD-10-CM | POA: Diagnosis not present

## 2023-07-31 DIAGNOSIS — Z89611 Acquired absence of right leg above knee: Secondary | ICD-10-CM | POA: Diagnosis not present

## 2023-07-31 DIAGNOSIS — M25511 Pain in right shoulder: Secondary | ICD-10-CM | POA: Diagnosis not present

## 2023-07-31 DIAGNOSIS — I771 Stricture of artery: Secondary | ICD-10-CM | POA: Diagnosis not present

## 2023-07-31 DIAGNOSIS — Z89612 Acquired absence of left leg above knee: Secondary | ICD-10-CM | POA: Diagnosis not present

## 2023-07-31 DIAGNOSIS — I4891 Unspecified atrial fibrillation: Secondary | ICD-10-CM | POA: Diagnosis not present

## 2023-08-12 DIAGNOSIS — Z5189 Encounter for other specified aftercare: Secondary | ICD-10-CM | POA: Insufficient documentation

## 2023-08-13 ENCOUNTER — Ambulatory Visit

## 2023-08-13 VITALS — BP 108/62 | HR 72

## 2023-08-13 DIAGNOSIS — I340 Nonrheumatic mitral (valve) insufficiency: Secondary | ICD-10-CM | POA: Insufficient documentation

## 2023-08-13 DIAGNOSIS — Z01810 Encounter for preprocedural cardiovascular examination: Secondary | ICD-10-CM | POA: Diagnosis not present

## 2023-08-13 DIAGNOSIS — I2510 Atherosclerotic heart disease of native coronary artery without angina pectoris: Secondary | ICD-10-CM | POA: Diagnosis not present

## 2023-08-13 DIAGNOSIS — I509 Heart failure, unspecified: Secondary | ICD-10-CM | POA: Diagnosis not present

## 2023-08-13 DIAGNOSIS — I5042 Chronic combined systolic (congestive) and diastolic (congestive) heart failure: Secondary | ICD-10-CM

## 2023-08-13 NOTE — Assessment & Plan Note (Signed)
 recovered ischemic cardiomyopathy with recent echocardiogram from November 2024 showing LVEF 60 to 65% Echocardiogram from April 2025 EF 50 to 55%.  Abnormal mammogram compensated. Continue with furosemide  20 mg once a day Continue with low-salt diet. Continue with metoprolol  succinate 25 mg once daily and lisinopril  5 mg once daily.

## 2023-08-13 NOTE — Assessment & Plan Note (Addendum)
 prior PCI's in last cath from January 2018 showing CTO of RCA with distal collaterals and nonobstructive disease in LCx and LAD, recovered ischemic cardiomyopathy.  Currently on Eliquis, hence not on aspirin . If Eliquis is being held for any reason, recommend starting aspirin  81 mg unless contraindicated.  Continue rosuvastatin  40 mg once daily

## 2023-08-13 NOTE — Progress Notes (Signed)
 Cardiology Consultation:    Date:  08/13/2023   ID:  [name] [name], DOB 1940/06/08, MRN [id number]  PCP:  Gaither Juba, MD  Cardiologist:  Daymon Evans Ishaq Maffei, MD   Referring MD: Gaither Juba, MD   No chief complaint on file.    ASSESSMENT AND PLAN:   [name] 83/M with history of CAD prior PCI's in last cath from January 2018 showing CTO of RCA with distal collaterals and nonobstructive disease in LCx and LAD, recovered ischemic cardiomyopathy with recent echocardiogram from November 2024 showing LVEF 60 to 65%, chronic CHF without any recent heart failure related episodes or admissions to the hospital, mild to moderate valvular disease involving the aortic valve with stenosis and insufficiency, mild mitral regurgitation, paroxysmal A-fib remains in sinus rhythm and on anticoagulation with Eliquis, hypertension, hyperlipidemia, extensive peripheral vascular disease s/p right carotid endarterectomy, bilateral lower extremity above-knee amputations, with significant discrepancy in systolic blood pressures between the left upper arm and right upper arm with a gradient greater than 80 mmHg.    Problem List Items Addressed This Visit     Chronic combined systolic and diastolic heart failure (HCC)   recovered ischemic cardiomyopathy with recent echocardiogram from November 2024 showing LVEF 60 to 65% Echocardiogram from April 2025 EF 50 to 55%.  Abnormal mammogram compensated. Continue with furosemide  20 mg once a day Continue with low-salt diet. Continue with metoprolol  succinate 25 mg once daily and lisinopril  5 mg once daily.       CAD in native artery   prior PCI's in last cath from January 2018 showing CTO of RCA with distal collaterals and nonobstructive disease in LCx and LAD, recovered ischemic cardiomyopathy.  Currently on Eliquis, hence not on aspirin . If Eliquis is being held for any reason, recommend starting aspirin  81 mg unless contraindicated.  Continue  rosuvastatin  40 mg once daily      CHF (congestive heart failure) (HCC)   Preop cardiovascular exam - Primary   Please refer to my previous recommendations from my office note 05/28/2023 with regards to his perioperative cardiovascular risk assessment.  He does not have any active cardiac symptoms. Does have significant cardiovascular risk factors and history. LV function remains preserved with recent echocardiogram EF 50 to 55%. No major ischemia burden on recent Lexiscan  stress test with high risk findings in the setting of small peri-infarct ischemia and large perfusion defect in basal to apical inferior segments corresponding to his underlying RCA disease.  Remains elevated risk for perioperative cardiovascular complications. No cardiac intervention recommended at this time.  Elective right shoulder surgery monitor possible abnormal will be left to the discretion of shared decision making between the patient and the surgeon. Mr. [name] at this point is in favor of holding off on any surgery and continue with conservative management.  Perioperative blood pressure management could be tricky given gradient over 90 mmHg between right and left upper extremities secondary to subclavian stenosis.       Moderate mitral regurgitation   Echocardiogram April 2025 with mitral regurgitation moderate.  Will follow-up with echocardiogram tentatively in 1 year.       Return to clinic tentatively in 6 months.   History of Present Illness:    [name] [name] is a 83 y.o. male who is being seen today for follow up. PCP is Gaither Juba, MD.  Last visit with me in the office was 05/28/2023   Has significant past medical history of CAD prior PCI's in last cath  from January 2018 showing CTO of RCA with distal collaterals and nonobstructive disease in LCx and LAD, recovered ischemic cardiomyopathy with recent echocardiogram from November 2024 showing LVEF 60 to 65%, chronic CHF without any recent  heart failure related episodes or admissions to the hospital, mild to moderate valvular disease involving the aortic valve with stenosis and insufficiency, mild mitral regurgitation, paroxysmal A-fib remains in sinus rhythm and on anticoagulation with Eliquis, hypertension, hyperlipidemia, extensive peripheral vascular disease s/p right carotid endarterectomy, bilateral lower extremity above-knee amputations, with significant discrepancy in systolic blood pressures between the left upper arm and right upper arm with a gradient greater than 80 mmHg.   Anticipating surgery for the right shoulder. He does not have any active cardiac symptoms. Cardiac risk assessment with stress test nuclear imaging May 22, 2023 showed peri-infarct ischemia involving the basal to apical inferior segments corresponding to the RCA territory disease however overall ischemia burden does not appear to be high.  LVEF on this was calculated to be reduced 31%.  However recent echocardiogram from November 2024 showed normal LVEF. Also repeat limited echocardiogram from 06/19/2023 noted LVEF 50 to 55% low normal with grade 1 diastolic dysfunction, moderate mitral regurgitation.  Reviewed information once again. From cardiac standpoint he denies any symptoms of chest pain or shortness of breath. Able to continue to manage his own affairs by using his motorized wheelchair. shoulder pain present but able to manage with pain medications and seems he is having reasonable quality of life.    Past Medical History:  Diagnosis Date   Acute systolic congestive heart failure (HCC) 02/01/2016   Amputation of both lower extremities (HCC) 08/05/2017   Anemia    Anxiety    Arthritis    Atrial fibrillation (HCC)    Bell's palsy    Blood transfusion without reported diagnosis    CAD in native artery 09/19/2014   Overview:  1.s/p nonQwave MI 2001, PTCA and stent of LAD and subsequently repeat PTCA and stent of 2 lesions in LAD 08-19-00    2. MPS in March 2011 wo ischemia, EF 44%   3. Lexiscan  MPS 07/07/11 wo ischemia, normal EF%   Cardiomyopathy (HCC) 03/26/2016   Carotid artery disease (HCC) 04/29/2016   Overview:  S/P right CEA   Carotid artery occlusion    CHF (congestive heart failure) (HCC)    Chronic anticoagulation 04/26/2017   Chronic combined systolic and diastolic heart failure (HCC) 02/01/2016   COPD (chronic obstructive pulmonary disease) (HCC)    Coronary artery disease    Decreased cardiac ejection fraction 05/29/2023   Degenerative joint disease of shoulder region 12/21/2022   Demand ischemia of myocardium (HCC) 01/15/2015   Depression    loss of two children and a grandchild   Essential hypertension 09/19/2014   Full thickness rotator cuff tear 10/14/2022   High cholesterol 04/24/2017   History of atrial fibrillation 04/29/2016   HIT (heparin-induced thrombocytopenia) (HCC) 04/29/2016   Hyperlipidemia    Hypertension    Hypertensive heart disease 09/19/2014   Impaired mobility and activities of daily living 05/12/2016   Injury of right rotator cuff 07/17/2022   Intermittent claudication (HCC) 02/24/2012   Ischemic cardiomyopathy 02/01/2016   Overview:  Added automatically from request for surgery [id number]   Lumbar spondylosis 04/24/2017   Myocardial infarction (HCC)    X's 6   Neuropathy 04/24/2017   PAF (paroxysmal atrial fibrillation) (HCC) 04/26/2017   Pain in limb 02/24/2012   PAOD (peripheral arterial occlusive disease) (HCC) 01/22/2016   Peripheral  vascular disease (HCC)    Peripheral vascular disease, unspecified (HCC) 02/24/2012   Preop cardiovascular exam    Preoperative cardiovascular examination 04/26/2017   S/P AKA (above knee amputation) unilateral, left (HCC) 05/12/2016   Seizures (HCC)    Sleep apnea    Stroke [facility])     Past Surgical History:  Procedure Laterality Date    stimulator  09/30/2010   Implantation of Spinal Stimulator    removed  now   ANGIOPLASTY  01/2000    with stent   APPENDECTOMY     CAROTID ENDARTERECTOMY  09/2000   RIGHT  cea   Catherization  07/2000   Cardiac   CHOLECYSTECTOMY     Gall Bladder- llaproscopic   COLON SURGERY  02/2007   Ischemic   EYE SURGERY  1949   FEMORAL BYPASS     FINGER AMPUTATION  1960   Right  thumb   FRACTURE SURGERY     LEG AMPUTATION ABOVE KNEE Bilateral    Bil AKA   ROTATOR CUFF REPAIR  2007   Right  shoulder   SPINAL FUSION  11/24/2010   SPINE SURGERY  03/1999   THROMBOENDARTERECTOMY      Current Medications: Current Meds  Medication Sig   acetaminophen  (TYLENOL ) 650 MG CR tablet Take 1,300 mg by mouth at bedtime.   apixaban (ELIQUIS) 5 MG TABS tablet Take 5 mg by mouth 2 (two) times daily.   carbamazepine (TEGRETOL XR) 100 MG 12 hr tablet Take 100 mg by mouth at bedtime.   Cyanocobalamin (VITAMIN B-12) 5000 MCG TBDP Take 5,000 mcg by mouth in the morning.   fenofibrate 160 MG tablet Take 160 mg by mouth in the morning.   furosemide  (LASIX ) 20 MG tablet Take 1 tablet (20 mg total) by mouth daily.   gabapentin (NEURONTIN) 300 MG capsule Take 1,200 mg by mouth 3 (three) times daily.    lisinopril  (ZESTRIL ) 5 MG tablet Take 5 mg by mouth in the morning.   metoprolol  succinate (TOPROL  XL) 25 MG 24 hr tablet Take 1 tablet (25 mg total) by mouth daily. hold if SBP (top number on blood pressure reading) is less than 110 mmHG or heart rate less than 60 bpm.   nitroGLYCERIN  (NITROSTAT ) 0.4 MG SL tablet Place 1 tablet (0.4 mg total) under the tongue every 5 (five) minutes as needed for chest pain.   rosuvastatin  (CRESTOR ) 40 MG tablet Take 40 mg by mouth every evening.     Allergies:   Codeine, Heparin, Percocet [oxycodone-acetaminophen ], Promethazine hcl, Buprenorphine hcl, Morphine and codeine, Amitriptyline, Iodinated contrast media, and Lyrica [pregabalin]   Social History   Socioeconomic History   Marital status: Married    Spouse name: Not on file   Number of children: Not on file   Years of  education: Not on file   Highest education level: Not on file  Occupational History   Not on file  Tobacco Use   Smoking status: Former    Types: Cigarettes    Start date: 02/24/2001   Smokeless tobacco: Never  Vaping Use   Vaping status: Never Used  Substance and Sexual Activity   Alcohol use: No   Drug use: No   Sexual activity: Not Currently  Other Topics Concern   Not on file  Social History Narrative   Not on file   Social Drivers of Health   Financial Resource Strain: Not on file  Food Insecurity: Not on file  Transportation Needs: Not on file  Physical Activity: Not  on file  Stress: Not on file  Social Connections: Not on file     Family History: The patient's family history includes Cancer in his daughter and sister; Deep vein thrombosis in his son; Heart attack in his father and son; Heart disease in his father and son; Hyperlipidemia in his brother, father, and son; Hypertension in his father, mother, sister, and son. ROS:   Please see the history of present illness.    All 14 point review of systems negative except as described per history of present illness.  EKGs/Labs/Other Studies Reviewed:    The following studies were reviewed today:   EKG:       Recent Labs: 05/18/2023: BUN 26; Creatinine, Ser 1.03; Hemoglobin 10.9; Platelets 139; Potassium 4.0; Sodium 141  Recent Lipid Panel    Component Value Date/Time   CHOL 157 10/16/2020 1355   TRIG 225 (H) 10/16/2020 1355   HDL 29 (L) 10/16/2020 1355   CHOLHDL 5.4 (H) 10/16/2020 1355   LDLCALC 90 10/16/2020 1355    Physical Exam:    VS:  BP 108/62   Pulse 72   SpO2 95%     Wt Readings from Last 3 Encounters:  05/22/23 135 lb (61.2 kg)  12/22/22 135 lb (61.2 kg)  05/27/16 143 lb (64.9 kg)     GENERAL:  Well nourished, well developed in no acute distress NECK: No JVD; No carotid bruits CARDIAC: RRR, S1 and S2 present, no murmurs, no rubs, no gallops CHEST:  Clear to auscultation without rales,  wheezing or rhonchi  Extremities: b/l above knee amputation; left radial pulse 2+. Right radial pulse not palpable NEUROLOGIC:  Alert and oriented x 3  Medication Adjustments/Labs and Tests Ordered: Current medicines are reviewed at length with the patient today.  Concerns regarding medicines are outlined above.  No orders of the defined types were placed in this encounter.  No orders of the defined types were placed in this encounter.   Signed, Lura Sallies, MD, MPH, [facility]. 08/13/2023 6:05 PM    [facility]

## 2023-08-13 NOTE — Assessment & Plan Note (Signed)
 Please refer to my previous recommendations from my office note 05/28/2023 with regards to his perioperative cardiovascular risk assessment.  He does not have any active cardiac symptoms. Does have significant cardiovascular risk factors and history. LV function remains preserved with recent echocardiogram EF 50 to 55%. No major ischemia burden on recent Lexiscan  stress test with high risk findings in the setting of small peri-infarct ischemia and large perfusion defect in basal to apical inferior segments corresponding to his underlying RCA disease.  Remains elevated risk for perioperative cardiovascular complications. No cardiac intervention recommended at this time.  Elective right shoulder surgery monitor possible abnormal will be left to the discretion of shared decision making between the patient and the surgeon. [name] [name] at this point is in favor of holding off on any surgery and continue with conservative management.  Perioperative blood pressure management could be tricky given gradient over 90 mmHg between right and left upper extremities secondary to subclavian stenosis.

## 2023-08-13 NOTE — Assessment & Plan Note (Addendum)
 Echocardiogram April 2025 with mitral regurgitation moderate.  Will follow-up with echocardiogram tentatively in 1 year.

## 2023-08-13 NOTE — Patient Instructions (Signed)

## 2023-08-20 DIAGNOSIS — H04123 Dry eye syndrome of bilateral lacrimal glands: Secondary | ICD-10-CM | POA: Diagnosis not present

## 2023-08-20 DIAGNOSIS — Z961 Presence of intraocular lens: Secondary | ICD-10-CM | POA: Diagnosis not present

## 2023-08-27 ENCOUNTER — Ambulatory Visit: Admitting: Cardiology

## 2023-09-16 DIAGNOSIS — M19011 Primary osteoarthritis, right shoulder: Secondary | ICD-10-CM | POA: Diagnosis not present

## 2023-09-17 ENCOUNTER — Other Ambulatory Visit (HOSPITAL_COMMUNITY): Payer: Self-pay

## 2023-09-17 ENCOUNTER — Telehealth: Payer: Self-pay

## 2023-09-17 NOTE — Telephone Encounter (Signed)
 Completed patient assistance application for Xarelto  and emailed to Tiffany to have patient and provider review and sign at upcoming appointment.

## 2023-09-18 NOTE — Telephone Encounter (Signed)
 PAP: Application for Xarelto  has been submitted to [employer] ([employer]), via fax

## 2023-09-21 ENCOUNTER — Other Ambulatory Visit (HOSPITAL_COMMUNITY): Payer: Self-pay

## 2023-09-21 NOTE — Progress Notes (Signed)
 Pharmacy Medication Assistance Program Note    09/21/2023  Patient ID: LINKYN GOBIN, male   DOB: 11-Jan-1941, 83 y.o.   MRN: [id number]     09/17/2023  Outreach Medication One  Initial Outreach Date (Medication One) 09/17/2023  Manufacturer Medication One [facility] Xarelto   Dose of Xarelto  20mg   Type of Assistance Manufacturer Assistance  Date Application Sent to Patient 09/17/2023  Application Items Requested Application  Date Application Sent to Prescriber 09/17/2023  Name of Prescriber Marcellus Baptist  Date Application Received From Patient 09/18/2023  Date Application Received From Provider 09/18/2023  Date Application Submitted to Manufacturer 09/18/2023  Method Application Sent to Manufacturer Fax  Patient Assistance Determination Approved  Approval Start Date 09/21/2023  Approval End Date 09/20/2024  Patient Notification Method Telephone Call  Telephone Call Outcome Left Voicemail     Signature

## 2023-09-21 NOTE — Telephone Encounter (Signed)
 PAP: Patient assistance application for Xarelto  has been approved by PAP Companies: [employer] from 09/21/2023 to 09/20/2024. Medication should be delivered to PAP Delivery: Home. For further shipping updates, please contact Vicci ODESSIA Vicci ([employer]) at [phone]. Patient ID is: no id

## 2023-09-25 ENCOUNTER — Telehealth: Payer: Self-pay | Admitting: Pharmacist

## 2023-09-25 NOTE — Progress Notes (Signed)
   09/25/2023  Patient ID: [name] [name], male   DOB: 1940/12/22, 83 y.o.   MRN: [id number]  Telephonic engagement with patient's spouse, [name] Hochstetler.  Mrs.Formoso confirms that the Xarelto  20 mg has arrived today. Advised Mrs. Dubeau that patient may continue his current supply of Eliquis until finished. Patient can start Xarelto  20 mg 1 tablet daily at the time his next dose of Eliquis would have been due per manufacturer guidance of switching between oral anticoagulants.  Mrs. Montellano verbalized understanding and was appreciative of my call.   Annabella Galla, PharmD Clinical Pharmacist [place] Direct Dial: [phone]

## 2023-10-02 ENCOUNTER — Ambulatory Visit (HOSPITAL_COMMUNITY): Admit: 2023-10-02 | Admitting: Orthopedic Surgery

## 2023-10-02 SURGERY — ARTHROPLASTY, SHOULDER, TOTAL, REVERSE
Anesthesia: General | Site: Shoulder | Laterality: Right

## 2023-10-09 DIAGNOSIS — M75121 Complete rotator cuff tear or rupture of right shoulder, not specified as traumatic: Secondary | ICD-10-CM | POA: Diagnosis not present

## 2023-10-16 DIAGNOSIS — M25511 Pain in right shoulder: Secondary | ICD-10-CM | POA: Diagnosis not present

## 2023-10-16 DIAGNOSIS — M6281 Muscle weakness (generalized): Secondary | ICD-10-CM | POA: Diagnosis not present

## 2023-10-19 DIAGNOSIS — M6281 Muscle weakness (generalized): Secondary | ICD-10-CM | POA: Diagnosis not present

## 2023-10-19 DIAGNOSIS — M25511 Pain in right shoulder: Secondary | ICD-10-CM | POA: Diagnosis not present

## 2023-10-23 DIAGNOSIS — M25511 Pain in right shoulder: Secondary | ICD-10-CM | POA: Diagnosis not present

## 2023-10-23 DIAGNOSIS — M6281 Muscle weakness (generalized): Secondary | ICD-10-CM | POA: Diagnosis not present

## 2023-10-27 DIAGNOSIS — M25511 Pain in right shoulder: Secondary | ICD-10-CM | POA: Diagnosis not present

## 2023-10-27 DIAGNOSIS — M6281 Muscle weakness (generalized): Secondary | ICD-10-CM | POA: Diagnosis not present

## 2023-10-29 DIAGNOSIS — M6281 Muscle weakness (generalized): Secondary | ICD-10-CM | POA: Diagnosis not present

## 2023-10-29 DIAGNOSIS — M25511 Pain in right shoulder: Secondary | ICD-10-CM | POA: Diagnosis not present

## 2023-11-02 DIAGNOSIS — M25511 Pain in right shoulder: Secondary | ICD-10-CM | POA: Diagnosis not present

## 2023-11-02 DIAGNOSIS — M6281 Muscle weakness (generalized): Secondary | ICD-10-CM | POA: Diagnosis not present

## 2023-11-05 DIAGNOSIS — M25511 Pain in right shoulder: Secondary | ICD-10-CM | POA: Diagnosis not present

## 2023-11-05 DIAGNOSIS — M6281 Muscle weakness (generalized): Secondary | ICD-10-CM | POA: Diagnosis not present

## 2023-11-09 DIAGNOSIS — M6281 Muscle weakness (generalized): Secondary | ICD-10-CM | POA: Diagnosis not present

## 2023-11-09 DIAGNOSIS — M25511 Pain in right shoulder: Secondary | ICD-10-CM | POA: Diagnosis not present

## 2023-11-12 DIAGNOSIS — M6281 Muscle weakness (generalized): Secondary | ICD-10-CM | POA: Diagnosis not present

## 2023-11-12 DIAGNOSIS — M25511 Pain in right shoulder: Secondary | ICD-10-CM | POA: Diagnosis not present

## 2023-11-16 DIAGNOSIS — M6281 Muscle weakness (generalized): Secondary | ICD-10-CM | POA: Diagnosis not present

## 2023-11-16 DIAGNOSIS — M25511 Pain in right shoulder: Secondary | ICD-10-CM | POA: Diagnosis not present

## 2023-11-19 DIAGNOSIS — M6281 Muscle weakness (generalized): Secondary | ICD-10-CM | POA: Diagnosis not present

## 2023-11-19 DIAGNOSIS — M25511 Pain in right shoulder: Secondary | ICD-10-CM | POA: Diagnosis not present

## 2023-11-23 DIAGNOSIS — M25511 Pain in right shoulder: Secondary | ICD-10-CM | POA: Diagnosis not present

## 2023-11-23 DIAGNOSIS — M6281 Muscle weakness (generalized): Secondary | ICD-10-CM | POA: Diagnosis not present

## 2023-11-26 DIAGNOSIS — M6281 Muscle weakness (generalized): Secondary | ICD-10-CM | POA: Diagnosis not present

## 2023-11-26 DIAGNOSIS — M25511 Pain in right shoulder: Secondary | ICD-10-CM | POA: Diagnosis not present

## 2024-01-29 ENCOUNTER — Ambulatory Visit: Admitting: Cardiology

## 2024-02-01 DIAGNOSIS — J329 Chronic sinusitis, unspecified: Secondary | ICD-10-CM | POA: Diagnosis not present

## 2024-02-01 DIAGNOSIS — J40 Bronchitis, not specified as acute or chronic: Secondary | ICD-10-CM | POA: Diagnosis not present

## 2024-02-10 ENCOUNTER — Ambulatory Visit: Admitting: Cardiology

## 2024-02-10 ENCOUNTER — Ambulatory Visit

## 2024-02-12 ENCOUNTER — Ambulatory Visit: Admitting: Cardiology

## 2024-02-22 ENCOUNTER — Other Ambulatory Visit: Payer: Self-pay | Admitting: Cardiology

## 2024-02-29 NOTE — Progress Notes (Signed)
 " Cardiology Office Note:    Date:  03/01/2024   ID:  [name] [name], DOB 22-Dec-1940, MRN [id number]  PCP:  Ina Marcellus RAMAN, MD  Cardiologist:  Redell Leiter, MD    Referring MD: Ina Marcellus RAMAN, MD    ASSESSMENT:    1. CAD in native artery   2. Chronic diastolic congestive heart failure (HCC)   3. PAF (paroxysmal atrial fibrillation) (HCC)   4. Chronic anticoagulation   5. Mixed hyperlipidemia   6. Peripheral vascular disease, unspecified    PLAN:    In order of problems listed above:  Stable CAD doing well with medical therapy asked to call the office and tell us  if he is taking 1 or both anticoagulants to be sure we do not have a medication error.  Continue beta-blocker high intensity statin and nitroglycerin  as needed Heart failure is nicely compensated ejection fraction is recovered continue his loop diuretic Maintaining sinus rhythm continue anticoagulant Continue his high intensity statin I will check labs on him today including an APO B and lipids PAD he is followed by vascular surgery Very concerned about hypertension he will do blood pressures at home 2 weeks left arm for MyChart switch metoprolol  to carvedilol  for the alpha blockade effect to control blood pressure and double the dose of his lisinopril . Also screen for adrenal excess aldosterone renin level   Next appointment: 3 months   Medication Adjustments/Labs and Tests Ordered: Current medicines are reviewed at length with the patient today.  Concerns regarding medicines are outlined above.  No orders of the defined types were placed in this encounter.  No orders of the defined types were placed in this encounter.    History of Present Illness:    [name] [name] is a 84 y.o. male with a hx of CAD LV dysfunction paroxysmal atrial fibrillation with chronic anticoagulation stroke history of heparin-induced thrombocytopenia PAD and hyperlipidemia last seen 12/22/2022.  Echocardiogram April of this year  showed improved ejection fraction 50 to 55% and moderate mitral regurgitation. From care everywhere coronary angiography 03/26/2016 showed single-vessel CAD chronic total occlusion of the right coronary artery with was felt to be adequate left to right collaterals 60% stenosis mid left circumflex discrete and calcific LAD with 20 to 30% stenosis and previous stent patent.  He was felt to be best treated with ongoing medical therapy.  He was noted to have severe vascular calcification on fluoroscopy and had no finding of LV outflow tract obstruction or aortic stenosis   Compliance with diet, lifestyle and medications: Yes  He sees me in follow-up there is a lot going on in his home his son is getting chemoradiation [facility] for head neck cancer and his wife was diagnosed with breast cancer also receiving complex therapies He just is not sure if he is taking Eliquis Xarelto  or both and he will call the office when he gets home Tells me his blood pressure is always elevated in the left arm but there has been no change to his medications Showed off blood pressure 202/80 in his left arm.  Markedly decreased pulses in the right arm.  He told me there was a discussion of vascular surgery but a decision to be conservative and is not having claudication in the arm From a cardiology perspective he is doing quite well and is not having angina shortness of breath chest pain palpitation or syncope Lipid profile last August 90 HDL cholesterol 86 Past Medical History:  Diagnosis Date  Acute systolic congestive heart failure (HCC) 02/01/2016   Amputation of both lower extremities (HCC) 08/05/2017   Anemia    Anxiety    Arthritis    Atrial fibrillation (HCC)    Bell's palsy    Blood transfusion without reported diagnosis    CAD in native artery 09/19/2014   Overview:  1.s/p nonQwave MI 2001, PTCA and stent of LAD and subsequently repeat PTCA and stent of 2 lesions in LAD 08-19-00   2. MPS in March 2011  wo ischemia, EF 44%   3. Lexiscan  MPS 07/07/11 wo ischemia, normal EF%   Cardiomyopathy (HCC) 03/26/2016   Carotid artery disease 04/29/2016   Overview:  S/P right CEA   Carotid artery occlusion    CHF (congestive heart failure) (HCC)    Chronic anticoagulation 04/26/2017   Chronic combined systolic and diastolic heart failure (HCC) 02/01/2016   COPD (chronic obstructive pulmonary disease) (HCC)    Coronary artery disease    Decreased cardiac ejection fraction 05/29/2023   Degenerative joint disease of shoulder region 12/21/2022   Demand ischemia of myocardium (HCC) 01/15/2015   Depression    loss of two children and a grandchild   Essential hypertension 09/19/2014   Full thickness rotator cuff tear 10/14/2022   High cholesterol 04/24/2017   History of atrial fibrillation 04/29/2016   HIT (heparin-induced thrombocytopenia) 04/29/2016   Hyperlipidemia    Hypertension    Hypertensive heart disease 09/19/2014   Impaired mobility and activities of daily living 05/12/2016   Injury of right rotator cuff 07/17/2022   Intermittent claudication 02/24/2012   Ischemic cardiomyopathy 02/01/2016   Overview:  Added automatically from request for surgery [id number]   Lumbar spondylosis 04/24/2017   Moderate mitral regurgitation 08/13/2023   Myocardial infarction (HCC)    X's 6   Neuropathy 04/24/2017   PAF (paroxysmal atrial fibrillation) (HCC) 04/26/2017   Pain in limb 02/24/2012   PAOD (peripheral arterial occlusive disease) 01/22/2016   Peripheral vascular disease    Peripheral vascular disease, unspecified 02/24/2012   Preop cardiovascular exam    Preoperative cardiovascular examination 04/26/2017   S/P AKA (above knee amputation) unilateral, left (HCC) 05/12/2016   Seizures (HCC)    Sleep apnea    Stroke (HCC)     Current Medications: Active Medications[1]    EKGs/Labs/Other Studies Reviewed:    The following studies were reviewed today:  Cardiac Studies & Procedures    ______________________________________________________________________________________________   STRESS TESTS  MYOCARDIAL PERFUSION IMAGING 05/22/2023  Interpretation Summary   Findings are consistent with infarction with peri-infarct ischemia. The study is high risk.   No ST deviation was noted.   LV perfusion is abnormal. There is evidence of ischemia. There is evidence of infarction. Defect 1: There is a medium defect with severe reduction in uptake present in the apical to basal inferior location(s) that is partially reversible. There is abnormal wall motion in the defect area. Consistent with infarction and peri-infarct ischemia.   Left ventricular function is abnormal. Global function is severely reduced. There was a single regional abnormality. End diastolic cavity size is moderately enlarged. End systolic cavity size is moderately enlarged.   Prior study not available for comparison.  Abnormal, high risk stress nuclear study with prior inferior infarct and very mild peri-infarct ischemia; gated ejection fraction 31% with global hypokinesis and akinesis of the basal inferior wall.  Study interpreted as high risk due to reduced LV function.   ECHOCARDIOGRAM  ECHOCARDIOGRAM LIMITED 06/19/2023  Narrative ECHOCARDIOGRAM LIMITED REPORT    Patient  Name:   [name] [name] [facility] Date of Exam: 06/19/2023 Medical Rec #:  [id number]         Height:       48.0 in Accession #:    [id number]        Weight:       135.0 lb Date of Birth:  1940/06/18         BSA:          1.343 m Patient Age:    83 years          BP:           90/60 mmHg Patient Gender: M                 HR:           70 bpm. Exam Location:  [place]  Procedure: Limited Echo (Both Spectral and Color Flow Doppler were utilized during procedure).  Indications:    Decreased cardiac ejection fraction [R93.1 (ICD-10-CM)]  History:        Patient has prior history of Echocardiogram examinations, most recent 12/30/2022. CHF and  Cardiomyopathy, CAD, Stroke, Arrythmias:Atrial Fibrillation; Risk Factors:Dyslipidemia and Hypertension.  Sonographer:    Charlie Jointer RDCS Referring Phys: [id number] ALEAN SAUNDERS MADIREDDY  IMPRESSIONS   1. Left ventricular ejection fraction, by estimation, is 50 to 55%. Left ventricular ejection fraction by 3D volume is 44 %. Left ventricular ejection fraction by PLAX is 47 %. The left ventricle has low normal function. There is mild left ventricular hypertrophy. Left ventricular diastolic parameters are consistent with Grade I diastolic dysfunction (impaired relaxation). The average left ventricular global longitudinal strain is -11.3 %. The global longitudinal strain is abnormal. 2. Left atrial size was severely dilated. 3. The mitral valve is degenerative. Moderate mitral valve regurgitation. Mild mitral stenosis. The mean mitral valve gradient is 4.0 mmHg. 4. The aortic valve is calcified. There is moderate calcification of the aortic valve. There is moderate thickening of the aortic valve.  FINDINGS Left Ventricle: Left ventricular ejection fraction, by estimation, is 50 to 55%. Left ventricular ejection fraction by PLAX is 47 %. Left ventricular ejection fraction by 3D volume is 44 %. The left ventricle has low normal function. The average left ventricular global longitudinal strain is -11.3 %. Strain was performed and the global longitudinal strain is abnormal. There is mild left ventricular hypertrophy. Left ventricular diastolic parameters are consistent with Grade I diastolic dysfunction (impaired relaxation).  Left Atrium: Left atrial size was severely dilated.  Mitral Valve: The mitral valve is degenerative in appearance. There is moderate thickening of the mitral valve leaflet(s). There is severe calcification of the mitral valve leaflet(s). Moderately decreased mobility of the mitral valve leaflets. Mild to moderate mitral annular calcification. Moderate mitral valve  regurgitation. Mild mitral valve stenosis. MV peak gradient, 10.6 mmHg. The mean mitral valve gradient is 4.0 mmHg.  Aortic Valve: The aortic valve is calcified. There is moderate calcification of the aortic valve. There is moderate thickening of the aortic valve. Aortic regurgitation PHT measures 413 msec. Aortic valve mean gradient measures 10.0 mmHg. Aortic valve peak gradient measures 18.1 mmHg. Aortic valve area, by VTI measures 0.99 cm.  Additional Comments: 3D was performed not requiring image post processing on an independent workstation and was abnormal.  LEFT VENTRICLE PLAX 2D LV EF:         Left            Diastology ventricular     LV e' medial:  6.42 cm/s ejection        LV E/e' medial:  19.4 fraction by     LV e' lateral:   7.40 cm/s PLAX is 47      LV E/e' lateral: 16.8 %. LVIDd:         5.40 cm         2D Longitudinal LVIDs:         4.10 cm         Strain LV PW:         1.20 cm         2D Strain GLS   -11.3 % LV IVS:        1.40 cm         Avg: LVOT diam:     1.80 cm LV SV:         53              3D Volume EF LV SV Index:   39              LV 3D EF:    Left LVOT Area:     2.54 cm                     ventricul ar ejection fraction by 3D volume is 44 %.  3D Volume EF: 3D EF:        44 % LV EDV:       154 ml LV ESV:       86 ml LV SV:        68 ml  RIGHT VENTRICLE             IVC RV Basal diam:  3.40 cm     IVC diam: 1.90 cm RV Mid diam:    2.70 cm RV S prime:     14.60 cm/s TAPSE (M-mode): 2.8 cm  LEFT ATRIUM             Index        RIGHT ATRIUM           Index LA diam:        3.80 cm 2.83 cm/m   RA Area:     12.50 cm LA Vol (A2C):   90.2 ml 67.14 ml/m  RA Volume:   25.90 ml  19.28 ml/m LA Vol (A4C):   96.5 ml 71.83 ml/m LA Biplane Vol: 93.3 ml 69.45 ml/m AORTIC VALVE AV Area (Vmax):    0.99 cm AV Area (Vmean):   1.00 cm AV Area (VTI):     0.99 cm AV Vmax:           213.00 cm/s AV Vmean:          151.000 cm/s AV VTI:            0.535  m AV Peak Grad:      18.1 mmHg AV Mean Grad:      10.0 mmHg LVOT Vmax:         82.90 cm/s LVOT Vmean:        59.200 cm/s LVOT VTI:          0.208 m LVOT/AV VTI ratio: 0.39 AI PHT:            413 msec  AORTA Ao Root diam: 3.80 cm Ao Asc diam:  3.20 cm  MITRAL VALVE MV Area (PHT): 3.50 cm     SHUNTS MV Area VTI:   1.16 cm  Systemic VTI:  0.21 m MV Peak grad:  10.6 mmHg    Systemic Diam: 1.80 cm MV Mean grad:  4.0 mmHg MV Vmax:       1.63 m/s MV Vmean:      94.8 cm/s MV Decel Time: 217 msec MR Peak grad: 213.2 mmHg MR Mean grad: 123.0 mmHg MR Vmax:      730.00 cm/s MR Vmean:     514.0 cm/s MV E velocity: 124.50 cm/s MV A velocity: 167.00 cm/s MV E/A ratio:  0.75  Lamar Fitch MD Electronically signed by Lamar Fitch MD Signature Date/Time: 06/19/2023/5:05:05 PM    Final          ______________________________________________________________________________________________          Recent Labs: 05/18/2023: BUN 26; Creatinine, Ser 1.03; Hemoglobin 10.9; Platelets 139; Potassium 4.0; Sodium 141  Recent Lipid Panel    Component Value Date/Time   CHOL 157 10/16/2020 1355   TRIG 225 (H) 10/16/2020 1355   HDL 29 (L) 10/16/2020 1355   CHOLHDL 5.4 (H) 10/16/2020 1355   LDLCALC 90 10/16/2020 1355    Physical Exam:    VS:  BP 110/78   Pulse 76   SpO2 97%     Wt Readings from Last 3 Encounters:  05/22/23 135 lb (61.2 kg)  12/22/22 135 lb (61.2 kg)  05/27/16 143 lb (64.9 kg)     GEN: He appears more alert and stronger than when previously seen lateral AKA well nourished, well developed in no acute distress HEENT: Normal NECK: No JVD; No carotid bruits LYMPHATICS: No lymphadenopathy CARDIAC: RRR, no murmurs, rubs, gallops RESPIRATORY:  Clear to auscultation without rales, wheezing or rhonchi  ABDOMEN: Soft, non-tender, non-distended MUSCULOSKELETAL:  No edema; No deformity  SKIN: Warm and dry NEUROLOGIC:  Alert and oriented x 3 PSYCHIATRIC:   Normal affect    Signed, Redell Leiter, MD  03/01/2024 9:03 AM    [facility]      [1]  Current Meds  Medication Sig   acetaminophen  (TYLENOL ) 650 MG CR tablet Take 1,300 mg by mouth at bedtime.   apixaban (ELIQUIS) 5 MG TABS tablet Take 5 mg by mouth 2 (two) times daily.   carbamazepine (TEGRETOL XR) 100 MG 12 hr tablet Take 100 mg by mouth at bedtime.   Cyanocobalamin (VITAMIN B-12) 5000 MCG TBDP Take 5,000 mcg by mouth in the morning.   fenofibrate 160 MG tablet Take 160 mg by mouth in the morning.   furosemide  (LASIX ) 20 MG tablet TAKE 1 TABLET BY MOUTH DAILY   gabapentin (NEURONTIN) 300 MG capsule Take 1,200 mg by mouth 3 (three) times daily.    lisinopril  (ZESTRIL ) 5 MG tablet Take 5 mg by mouth in the morning.   metoprolol  succinate (TOPROL  XL) 25 MG 24 hr tablet Take 1 tablet (25 mg total) by mouth daily. hold if SBP (top number on blood pressure reading) is less than 110 mmHG or heart rate less than 60 bpm.   nitroGLYCERIN  (NITROSTAT ) 0.4 MG SL tablet Place 1 tablet (0.4 mg total) under the tongue every 5 (five) minutes as needed for chest pain.   rosuvastatin  (CRESTOR ) 40 MG tablet Take 40 mg by mouth every evening.   XARELTO  20 MG TABS tablet Take 20 mg by mouth daily.   "

## 2024-03-01 ENCOUNTER — Other Ambulatory Visit: Payer: Self-pay

## 2024-03-01 ENCOUNTER — Encounter: Payer: Self-pay | Admitting: Cardiology

## 2024-03-01 ENCOUNTER — Ambulatory Visit: Attending: Cardiology | Admitting: Cardiology

## 2024-03-01 ENCOUNTER — Telehealth: Payer: Self-pay | Admitting: Cardiology

## 2024-03-01 VITALS — BP 202/80 | HR 76

## 2024-03-01 DIAGNOSIS — E782 Mixed hyperlipidemia: Secondary | ICD-10-CM

## 2024-03-01 DIAGNOSIS — Z7901 Long term (current) use of anticoagulants: Secondary | ICD-10-CM

## 2024-03-01 DIAGNOSIS — I5032 Chronic diastolic (congestive) heart failure: Secondary | ICD-10-CM

## 2024-03-01 DIAGNOSIS — I480 Paroxysmal atrial fibrillation: Secondary | ICD-10-CM | POA: Diagnosis not present

## 2024-03-01 DIAGNOSIS — I739 Peripheral vascular disease, unspecified: Secondary | ICD-10-CM

## 2024-03-01 DIAGNOSIS — I2510 Atherosclerotic heart disease of native coronary artery without angina pectoris: Secondary | ICD-10-CM

## 2024-03-01 MED ORDER — CARVEDILOL 12.5 MG PO TABS: 12.5000 mg | ORAL_TABLET | Freq: Two times a day (BID) | ORAL | 3 refills | Status: DC

## 2024-03-01 MED ORDER — LISINOPRIL 5 MG PO TABS: 5.0000 mg | ORAL_TABLET | Freq: Two times a day (BID) | ORAL | 3 refills | Status: AC

## 2024-03-01 NOTE — Telephone Encounter (Signed)
 Spoke to Dr. Monetta and he recommended keeping the patient on Xarelto  and discontinuing his Eliquis. The patient's Eliquis was discontinued. [name], the patient's wife informed to continue on Xarelto . [name] verbalized understanding and she had no further questions at this time.

## 2024-03-01 NOTE — Telephone Encounter (Signed)
 Pt c/o medication issue:  1. Name of Medication:  XARELTO  20 MG TABS tablet   2. How are you currently taking this medication (dosage and times per day)?   3. Are you having a reaction (difficulty breathing--STAT)?   4. What is your medication issue?   Patient's wife would like to inform Dr. Monetta that patient takes Xarelto  20 MG once daily. She says this was discussed during appointment.

## 2024-03-01 NOTE — Patient Instructions (Signed)
 Medication Instructions:  Your physician has recommended you make the following change in your medication:   STOP: Metoprolol  START: Carvedilol  12.5 mg two times daily START: Lisinopril  5 mg two times daily  *If you need a refill on your cardiac medications before your next appointment, please call your pharmacy*  Lab Work: Your physician recommends that you return for lab work in:   Labs today: CMP, Lipids, Apo B, Aldosterone Renin level  If you have labs (blood work) drawn today and your tests are completely normal, you will receive your results only by: MyChart Message (if you have MyChart) OR A paper copy in the mail If you have any lab test that is abnormal or we need to change your treatment, we will call you to review the results.  Testing/Procedures: None  Follow-Up: At [facility], you and your health needs are our priority.  As part of our continuing mission to provide you with exceptional heart care, our providers are all part of one team.  This team includes your primary Cardiologist (physician) and Advanced Practice Providers or APPs (Physician Assistants and Nurse Practitioners) who all work together to provide you with the care you need, when you need it.  Your next appointment:   3 month(s)  Provider:   Redell Leiter, MD    We recommend signing up for the patient portal called MyChart.  Sign up information is provided on this After Visit Summary.  MyChart is used to connect with patients for Virtual Visits (Telemedicine).  Patients are able to view lab/test results, encounter notes, upcoming appointments, etc.  Non-urgent messages can be sent to your provider as well.   To learn more about what you can do with MyChart, go to [email or web address].   Other Instructions Please check whether you are taking Xarelto  or Eliquis or both and call the office and let us  know   Please keep a BP log for 2 weeks and send by MyChart or mail.  Perform blood  pressures on your left arm.                    Dr. Leiter [address] [place], [place] [address]  Blood Pressure Record Sheet To take your blood pressure, you will need a blood pressure machine. You can buy a blood pressure machine (blood pressure monitor) at your clinic, drug store, or online. When choosing one, consider: An automatic monitor that has an arm cuff. A cuff that wraps snugly around your upper arm. You should be able to fit only one finger between your arm and the cuff. A device that stores blood pressure reading results. Do not choose a monitor that measures your blood pressure from your wrist or finger. Follow your health care provider's instructions for how to take your blood pressure. To use this form: Get one reading in the morning (a.m.) 1-2 hours after you take any medicines. Get one reading in the evening (p.m.) before supper.   Blood pressure log Date: _______________________  a.m. _____________________(1st reading) HR___________            p.m. _____________________(2nd reading) HR__________  Date: _______________________  a.m. _____________________(1st reading) HR___________            p.m. _____________________(2nd reading) HR__________  Date: _______________________  a.m. _____________________(1st reading) HR___________            p.m. _____________________(2nd reading) HR__________  Date: _______________________  a.m. _____________________(1st reading) HR___________            p.m.  _____________________(2nd reading) HR__________  Date: _______________________  a.m. _____________________(1st reading) HR___________            p.m. _____________________(2nd reading) HR__________  Date: _______________________  a.m. _____________________(1st reading) HR___________            p.m. _____________________(2nd reading) HR__________  Date: _______________________  a.m. _____________________(1st reading) HR___________            p.m.  _____________________(2nd reading) HR__________   This information is not intended to replace advice given to you by your health care provider. Make sure you discuss any questions you have with your health care provider. Document Revised: 06/01/2019 Document Reviewed: 06/01/2019 Elsevier Patient Education  2021 [employer].

## 2024-03-04 ENCOUNTER — Ambulatory Visit: Payer: Self-pay | Admitting: Cardiology

## 2024-03-04 LAB — LIPID PANEL
Chol/HDL Ratio: 3.5 ratio (ref 0.0–5.0)
Cholesterol, Total: 149 mg/dL (ref 100–199)
HDL: 43 mg/dL
LDL Chol Calc (NIH): 82 mg/dL (ref 0–99)
Triglycerides: 133 mg/dL (ref 0–149)
VLDL Cholesterol Cal: 24 mg/dL (ref 5–40)

## 2024-03-04 LAB — COMPREHENSIVE METABOLIC PANEL WITH GFR
ALT: 17 IU/L (ref 0–44)
AST: 20 IU/L (ref 0–40)
Albumin: 3.9 g/dL (ref 3.7–4.7)
Alkaline Phosphatase: 73 IU/L (ref 48–129)
BUN/Creatinine Ratio: 16 (ref 10–24)
BUN: 14 mg/dL (ref 8–27)
Bilirubin Total: 0.2 mg/dL (ref 0.0–1.2)
CO2: 27 mmol/L (ref 20–29)
Calcium: 9.9 mg/dL (ref 8.6–10.2)
Chloride: 103 mmol/L (ref 96–106)
Creatinine, Ser: 0.89 mg/dL (ref 0.76–1.27)
Globulin, Total: 2.1 g/dL (ref 1.5–4.5)
Glucose: 82 mg/dL (ref 70–99)
Potassium: 3.8 mmol/L (ref 3.5–5.2)
Sodium: 144 mmol/L (ref 134–144)
Total Protein: 6 g/dL (ref 6.0–8.5)
eGFR: 85 mL/min/1.73

## 2024-03-04 LAB — ALDOSTERONE + RENIN ACTIVITY W/ RATIO
Aldos/Renin Ratio: 0.2 (ref 0.0–20.0)
Aldosterone: 2.8 ng/dL (ref 0.0–30.0)
Renin Activity, Plasma: 11.483 ng/mL/h — ABNORMAL HIGH (ref 0.167–5.380)

## 2024-03-24 ENCOUNTER — Other Ambulatory Visit: Payer: Self-pay

## 2024-03-24 ENCOUNTER — Telehealth: Payer: Self-pay

## 2024-03-24 MED ORDER — CARVEDILOL 25 MG PO TABS: 25.0000 mg | ORAL_TABLET | Freq: Two times a day (BID) | ORAL | 3 refills | Status: AC

## 2024-03-24 NOTE — Telephone Encounter (Signed)
 Received the following message from Dr. Monetta below:  As expected blood pressure is elevated at home increase carvedilol  25 mg twice daily and if his numbers remain greater than 140 in about 2 weeks to let us  know  Called the patient and informed him of Dr. Leandrew recommendation regarding his blood pressure log. Patient verbalized understanding and had no further questions at this time. Carvedilol  medication ordered via Epic and sent to the patients pharmacy.

## 2024-03-25 DIAGNOSIS — I493 Ventricular premature depolarization: Secondary | ICD-10-CM | POA: Diagnosis not present

## 2024-03-25 DIAGNOSIS — I509 Heart failure, unspecified: Secondary | ICD-10-CM | POA: Diagnosis not present

## 2024-03-25 DIAGNOSIS — I2510 Atherosclerotic heart disease of native coronary artery without angina pectoris: Secondary | ICD-10-CM | POA: Diagnosis not present

## 2024-03-25 DIAGNOSIS — I160 Hypertensive urgency: Secondary | ICD-10-CM | POA: Diagnosis not present

## 2024-03-25 DIAGNOSIS — R9431 Abnormal electrocardiogram [ECG] [EKG]: Secondary | ICD-10-CM | POA: Diagnosis not present

## 2024-03-25 DIAGNOSIS — I340 Nonrheumatic mitral (valve) insufficiency: Secondary | ICD-10-CM | POA: Diagnosis not present

## 2024-03-25 DIAGNOSIS — I342 Nonrheumatic mitral (valve) stenosis: Secondary | ICD-10-CM | POA: Diagnosis not present

## 2024-03-25 DIAGNOSIS — I361 Nonrheumatic tricuspid (valve) insufficiency: Secondary | ICD-10-CM | POA: Diagnosis not present

## 2024-03-25 DIAGNOSIS — J9600 Acute respiratory failure, unspecified whether with hypoxia or hypercapnia: Secondary | ICD-10-CM | POA: Diagnosis not present

## 2024-03-25 DIAGNOSIS — I350 Nonrheumatic aortic (valve) stenosis: Secondary | ICD-10-CM | POA: Diagnosis not present

## 2024-03-25 DIAGNOSIS — I480 Paroxysmal atrial fibrillation: Secondary | ICD-10-CM | POA: Diagnosis not present

## 2024-03-26 DIAGNOSIS — R9431 Abnormal electrocardiogram [ECG] [EKG]: Secondary | ICD-10-CM | POA: Diagnosis not present

## 2024-05-30 ENCOUNTER — Ambulatory Visit: Admitting: Cardiology
